# Patient Record
Sex: Male | Born: 1935 | Race: White | Hispanic: No | Smoking: Never smoker
Health system: Southern US, Community
[De-identification: ages and names within clinical notes are randomized; demographics above are authoritative.]

## PROBLEM LIST (undated history)

## (undated) DIAGNOSIS — Z87442 Personal history of urinary calculi: Secondary | ICD-10-CM

## (undated) DIAGNOSIS — N200 Calculus of kidney: Secondary | ICD-10-CM

## (undated) DIAGNOSIS — Q7649 Other congenital malformations of spine, not associated with scoliosis: Secondary | ICD-10-CM

## (undated) DIAGNOSIS — D490 Neoplasm of unspecified behavior of digestive system: Secondary | ICD-10-CM

## (undated) DIAGNOSIS — E785 Hyperlipidemia, unspecified: Secondary | ICD-10-CM

## (undated) DIAGNOSIS — C61 Malignant neoplasm of prostate: Secondary | ICD-10-CM

## (undated) DIAGNOSIS — I340 Nonrheumatic mitral (valve) insufficiency: Secondary | ICD-10-CM

## (undated) DIAGNOSIS — N400 Enlarged prostate without lower urinary tract symptoms: Secondary | ICD-10-CM

## (undated) DIAGNOSIS — R3911 Hesitancy of micturition: Secondary | ICD-10-CM

## (undated) DIAGNOSIS — R600 Localized edema: Secondary | ICD-10-CM

## (undated) DIAGNOSIS — Z972 Presence of dental prosthetic device (complete) (partial): Secondary | ICD-10-CM

## (undated) DIAGNOSIS — K5909 Other constipation: Secondary | ICD-10-CM

## (undated) DIAGNOSIS — E538 Deficiency of other specified B group vitamins: Secondary | ICD-10-CM

## (undated) DIAGNOSIS — Z955 Presence of coronary angioplasty implant and graft: Secondary | ICD-10-CM

## (undated) DIAGNOSIS — I10 Essential (primary) hypertension: Secondary | ICD-10-CM

## (undated) DIAGNOSIS — G4730 Sleep apnea, unspecified: Secondary | ICD-10-CM

## (undated) DIAGNOSIS — M109 Gout, unspecified: Secondary | ICD-10-CM

## (undated) DIAGNOSIS — M810 Age-related osteoporosis without current pathological fracture: Secondary | ICD-10-CM

## (undated) DIAGNOSIS — R339 Retention of urine, unspecified: Secondary | ICD-10-CM

## (undated) DIAGNOSIS — Z974 Presence of external hearing-aid: Secondary | ICD-10-CM

## (undated) DIAGNOSIS — I071 Rheumatic tricuspid insufficiency: Secondary | ICD-10-CM

## (undated) DIAGNOSIS — I2510 Atherosclerotic heart disease of native coronary artery without angina pectoris: Secondary | ICD-10-CM

## (undated) DIAGNOSIS — M1990 Unspecified osteoarthritis, unspecified site: Secondary | ICD-10-CM

## (undated) DIAGNOSIS — M47819 Spondylosis without myelopathy or radiculopathy, site unspecified: Secondary | ICD-10-CM

## (undated) DIAGNOSIS — R0602 Shortness of breath: Secondary | ICD-10-CM

## (undated) DIAGNOSIS — M479 Spondylosis, unspecified: Secondary | ICD-10-CM

## (undated) HISTORY — DX: Other constipation: K59.09

## (undated) HISTORY — PX: LITHOTRIPSY: SUR834

## (undated) HISTORY — DX: Age-related osteoporosis without current pathological fracture: M81.0

## (undated) HISTORY — PX: TONSILLECTOMY: SUR1361

## (undated) HISTORY — DX: Hyperlipidemia, unspecified: E78.5

## (undated) HISTORY — DX: Nonrheumatic mitral (valve) insufficiency: I34.0

## (undated) HISTORY — PX: JOINT REPLACEMENT: SHX530

## (undated) HISTORY — DX: Deficiency of other specified B group vitamins: E53.8

## (undated) HISTORY — DX: Rheumatic tricuspid insufficiency: I07.1

## (undated) HISTORY — PX: APPENDECTOMY: SHX54

## (undated) HISTORY — DX: Spondylosis, unspecified: M47.9

## (undated) HISTORY — DX: Gout, unspecified: M10.9

## (undated) HISTORY — PX: FOREIGN BODY REMOVAL: SHX962

## (undated) HISTORY — DX: Retention of urine, unspecified: R33.9

## (undated) HISTORY — PX: TOTAL HIP ARTHROPLASTY: SHX124

## (undated) HISTORY — DX: Sleep apnea, unspecified: G47.30

## (undated) HISTORY — PX: BACK SURGERY: SHX140

## (undated) HISTORY — PX: HERNIA REPAIR: SHX51

## (undated) HISTORY — PX: KIDNEY STONE SURGERY: SHX686

## (undated) HISTORY — DX: Spondylosis without myelopathy or radiculopathy, site unspecified: M47.819

---

## 1998-05-15 HISTORY — PX: CARDIAC CATHETERIZATION: SHX172

## 2000-05-15 DIAGNOSIS — Z955 Presence of coronary angioplasty implant and graft: Secondary | ICD-10-CM

## 2000-05-15 HISTORY — DX: Presence of coronary angioplasty implant and graft: Z95.5

## 2004-04-26 ENCOUNTER — Ambulatory Visit: Payer: Self-pay | Admitting: Unknown Physician Specialty

## 2005-03-15 ENCOUNTER — Ambulatory Visit: Payer: Self-pay | Admitting: Urology

## 2006-12-07 ENCOUNTER — Ambulatory Visit: Payer: Self-pay | Admitting: Specialist

## 2007-01-25 ENCOUNTER — Ambulatory Visit: Payer: Self-pay | Admitting: Gastroenterology

## 2008-05-22 ENCOUNTER — Ambulatory Visit: Payer: Self-pay | Admitting: Vascular Surgery

## 2008-11-23 ENCOUNTER — Ambulatory Visit: Payer: Self-pay | Admitting: Physician Assistant

## 2008-11-30 ENCOUNTER — Ambulatory Visit: Payer: Self-pay | Admitting: Pain Medicine

## 2008-12-15 ENCOUNTER — Ambulatory Visit: Payer: Self-pay | Admitting: Pain Medicine

## 2008-12-30 ENCOUNTER — Ambulatory Visit: Payer: Self-pay | Admitting: Physician Assistant

## 2009-01-12 ENCOUNTER — Ambulatory Visit: Payer: Self-pay | Admitting: Pain Medicine

## 2009-01-26 ENCOUNTER — Ambulatory Visit: Payer: Self-pay | Admitting: Physician Assistant

## 2009-02-09 ENCOUNTER — Ambulatory Visit: Payer: Self-pay | Admitting: Pain Medicine

## 2009-02-24 ENCOUNTER — Ambulatory Visit: Payer: Self-pay | Admitting: Physician Assistant

## 2009-08-10 ENCOUNTER — Ambulatory Visit: Payer: Self-pay | Admitting: Urology

## 2009-08-24 ENCOUNTER — Ambulatory Visit: Payer: Self-pay | Admitting: Urology

## 2011-03-27 ENCOUNTER — Inpatient Hospital Stay: Payer: Self-pay | Admitting: Internal Medicine

## 2011-10-02 ENCOUNTER — Encounter: Payer: Self-pay | Admitting: Internal Medicine

## 2011-12-05 ENCOUNTER — Other Ambulatory Visit: Payer: Self-pay | Admitting: Neurosurgery

## 2011-12-05 DIAGNOSIS — M549 Dorsalgia, unspecified: Secondary | ICD-10-CM

## 2011-12-11 ENCOUNTER — Ambulatory Visit
Admission: RE | Admit: 2011-12-11 | Discharge: 2011-12-11 | Disposition: A | Discharge status: Home / Self Care | Payer: Medicare Other | Source: Ambulatory Visit | Attending: Neurosurgery | Admitting: Neurosurgery

## 2011-12-11 DIAGNOSIS — M549 Dorsalgia, unspecified: Secondary | ICD-10-CM

## 2012-01-09 ENCOUNTER — Other Ambulatory Visit: Payer: Self-pay | Admitting: Neurosurgery

## 2012-01-12 ENCOUNTER — Encounter (HOSPITAL_COMMUNITY): Payer: Self-pay | Admitting: Pharmacy Technician

## 2012-01-16 ENCOUNTER — Encounter (HOSPITAL_COMMUNITY)
Admission: RE | Admit: 2012-01-16 | Discharge: 2012-01-16 | Disposition: A | Discharge status: Home / Self Care | Payer: Medicare Other | Source: Ambulatory Visit | Attending: Neurosurgery | Admitting: Neurosurgery

## 2012-01-16 ENCOUNTER — Ambulatory Visit (HOSPITAL_COMMUNITY)
Admission: RE | Admit: 2012-01-16 | Discharge: 2012-01-16 | Disposition: A | Discharge status: Home / Self Care | Payer: Medicare Other | Source: Ambulatory Visit | Attending: Neurosurgery | Admitting: Neurosurgery

## 2012-01-16 ENCOUNTER — Encounter (HOSPITAL_COMMUNITY): Payer: Self-pay

## 2012-01-16 DIAGNOSIS — Z01812 Encounter for preprocedural laboratory examination: Secondary | ICD-10-CM | POA: Insufficient documentation

## 2012-01-16 DIAGNOSIS — J984 Other disorders of lung: Secondary | ICD-10-CM | POA: Insufficient documentation

## 2012-01-16 DIAGNOSIS — Z01818 Encounter for other preprocedural examination: Secondary | ICD-10-CM | POA: Insufficient documentation

## 2012-01-16 HISTORY — DX: Benign prostatic hyperplasia without lower urinary tract symptoms: N40.0

## 2012-01-16 HISTORY — DX: Atherosclerotic heart disease of native coronary artery without angina pectoris: I25.10

## 2012-01-16 HISTORY — DX: Essential (primary) hypertension: I10

## 2012-01-16 HISTORY — DX: Unspecified osteoarthritis, unspecified site: M19.90

## 2012-01-16 HISTORY — DX: Shortness of breath: R06.02

## 2012-01-16 HISTORY — DX: Hesitancy of micturition: R39.11

## 2012-01-16 HISTORY — DX: Personal history of urinary calculi: Z87.442

## 2012-01-16 LAB — BASIC METABOLIC PANEL
CO2: 31 mEq/L (ref 19–32)
Chloride: 102 mEq/L (ref 96–112)
Sodium: 142 mEq/L (ref 135–145)

## 2012-01-16 LAB — CBC
HCT: 43.6 % (ref 39.0–52.0)
MCV: 93.8 fL (ref 78.0–100.0)
RBC: 4.65 MIL/uL (ref 4.22–5.81)
WBC: 7.3 10*3/uL (ref 4.0–10.5)

## 2012-01-16 LAB — SURGICAL PCR SCREEN: Staphylococcus aureus: POSITIVE — AB

## 2012-01-16 NOTE — Consult Note (Addendum)
Anesthesia Chart Review:  Patient is a 78 year old male scheduled for L2-3 laminectomies by Dr. Lovell Sheehan on 01/22/12.  History includes CAD s/p RCA stent '04, HTN, HLD, osteoporosis, prior hip replacement, kidney stones, former smoker, obesity, BPH.  His Cardiologist is Dr. Gwen Pounds at [facility].  He was cleared for this procedure.  Nuclear stress test on 03/21/11 showed: 1) Normal Regadenoson ECG without no evidence of ischemia or dysrhythmia, 2) Normal left ventricular function with EF 47%, 3) Normal myocardial perfusion images at rest and peak stress without evidence of ischemia.  Echo on 08/09/10 showed normal LV systolic function, EF 60%, mild biatrial enlargement, moderate LVH, moderate MI/TI and mild AI, mild pulmonary HTN.  EKG on 03/08/11 showed SR, first degree AVB.  CXR on 01/16/12 showed: Ill-defined left lung lesion (lingular density, cannot exclude mass). Recommend chest CT for further evaluation.  This result was called to Graciella Belton.  She will review with Dr. Lovell Sheehan to determine the follow-up plan.  Labs noted.   Anticipate he can proceed if no new cardiopulmonary symptoms.  Shonna Chock, PA-C

## 2012-01-16 NOTE — Pre-Procedure Instructions (Addendum)
22 Theodore Padilla  01/16/2012   Your procedure is scheduled on:  Monday September 9  Report to [facility] Stay [facility] at 5:30 AM.  Call this number if you have problems the morning of surgery: [phone]   Remember:   Do not eat or drink:After Midnight.    Take these medicines the morning of surgery with A SIP OF WATER: allopurinol (Zyloprim)   Do not wear jewelry, make-up or nail polish.  Do not wear lotions, powders, or perfumes. You may wear deodorant.  Do not shave 48 hours prior to surgery. Men may shave face and neck.  Do not bring valuables to the hospital.  Contacts, dentures or bridgework may not be worn into surgery.  Leave suitcase in the car. After surgery it may be brought to your room.  For patients admitted to the hospital, checkout time is 11:00 AM the day of discharge.   Patients discharged the day of surgery will not be allowed to drive home.  Name and phone number of your driver: NA  Special Instructions: CHG Shower Use Special Wash: 1/2 bottle night before surgery and 1/2 bottle morning of surgery.   Please read over the following fact sheets that you were given: Pain Booklet, Coughing and Deep Breathing and Surgical Site Infection Prevention

## 2012-01-19 ENCOUNTER — Other Ambulatory Visit: Payer: Self-pay | Admitting: Neurosurgery

## 2012-01-19 ENCOUNTER — Ambulatory Visit
Admission: RE | Admit: 2012-01-19 | Discharge: 2012-01-19 | Disposition: A | Discharge status: Home / Self Care | Payer: Medicare Other | Source: Ambulatory Visit | Attending: Neurosurgery | Admitting: Neurosurgery

## 2012-01-19 DIAGNOSIS — R9389 Abnormal findings on diagnostic imaging of other specified body structures: Secondary | ICD-10-CM

## 2012-01-19 MED ORDER — IOHEXOL 300 MG/ML  SOLN
75.0000 mL | Freq: Once | INTRAMUSCULAR | Status: AC | PRN
  Administered 2012-01-19: 75 mL via INTRAVENOUS

## 2012-01-22 ENCOUNTER — Inpatient Hospital Stay (HOSPITAL_COMMUNITY): Payer: Medicare Other | Admitting: Vascular Surgery

## 2012-01-22 ENCOUNTER — Encounter (HOSPITAL_COMMUNITY)
Admission: RE | Disposition: A | Discharge status: Skilled Nursing Facility | Payer: Self-pay | Source: Ambulatory Visit | Attending: Neurosurgery

## 2012-01-22 ENCOUNTER — Inpatient Hospital Stay (HOSPITAL_COMMUNITY)
Admission: RE | Admit: 2012-01-22 | Discharge: 2012-01-26 | DRG: 491 | Disposition: A | Discharge status: Skilled Nursing Facility | Payer: Medicare Other | Source: Ambulatory Visit | Attending: Neurosurgery | Admitting: Neurosurgery

## 2012-01-22 ENCOUNTER — Encounter (HOSPITAL_COMMUNITY): Payer: Self-pay | Admitting: *Deleted

## 2012-01-22 ENCOUNTER — Encounter (HOSPITAL_COMMUNITY): Payer: Self-pay | Admitting: Vascular Surgery

## 2012-01-22 ENCOUNTER — Inpatient Hospital Stay (HOSPITAL_COMMUNITY): Payer: Medicare Other

## 2012-01-22 DIAGNOSIS — I2510 Atherosclerotic heart disease of native coronary artery without angina pectoris: Secondary | ICD-10-CM | POA: Diagnosis present

## 2012-01-22 DIAGNOSIS — Z7982 Long term (current) use of aspirin: Secondary | ICD-10-CM

## 2012-01-22 DIAGNOSIS — Z87891 Personal history of nicotine dependence: Secondary | ICD-10-CM

## 2012-01-22 DIAGNOSIS — I10 Essential (primary) hypertension: Secondary | ICD-10-CM | POA: Diagnosis present

## 2012-01-22 DIAGNOSIS — M48062 Spinal stenosis, lumbar region with neurogenic claudication: Principal | ICD-10-CM | POA: Diagnosis present

## 2012-01-22 DIAGNOSIS — N400 Enlarged prostate without lower urinary tract symptoms: Secondary | ICD-10-CM | POA: Diagnosis present

## 2012-01-22 DIAGNOSIS — M4120 Other idiopathic scoliosis, site unspecified: Secondary | ICD-10-CM | POA: Diagnosis present

## 2012-01-22 HISTORY — PX: LUMBAR LAMINECTOMY/DECOMPRESSION MICRODISCECTOMY: SHX5026

## 2012-01-22 SURGERY — LUMBAR LAMINECTOMY/DECOMPRESSION MICRODISCECTOMY 1 LEVEL
Anesthesia: General | Site: Back | Wound class: Clean

## 2012-01-22 MED ORDER — LISINOPRIL 10 MG PO TABS
10.0000 mg | ORAL_TABLET | Freq: Every day | ORAL | Status: DC
  Administered 2012-01-23 – 2012-01-26 (×4): 10 mg via ORAL
  Filled 2012-01-22 (×4): qty 1

## 2012-01-22 MED ORDER — SODIUM CHLORIDE 0.9 % IV BOLUS (SEPSIS)
250.0000 mL | Freq: Once | INTRAVENOUS | Status: AC
  Administered 2012-01-22: 250 mL via INTRAVENOUS

## 2012-01-22 MED ORDER — PHENOL 1.4 % MT LIQD: 1.0000 | OROMUCOSAL | Status: DC | PRN

## 2012-01-22 MED ORDER — ALLOPURINOL 100 MG PO TABS
100.0000 mg | ORAL_TABLET | Freq: Every day | ORAL | Status: DC
  Administered 2012-01-23 – 2012-01-26 (×4): 100 mg via ORAL
  Filled 2012-01-22 (×6): qty 1

## 2012-01-22 MED ORDER — EPHEDRINE SULFATE 50 MG/ML IJ SOLN
INTRAMUSCULAR | Status: DC | PRN
  Administered 2012-01-22 (×2): 5 mg via INTRAVENOUS

## 2012-01-22 MED ORDER — BACITRACIN ZINC 500 UNIT/GM EX OINT
TOPICAL_OINTMENT | CUTANEOUS | Status: DC | PRN
  Administered 2012-01-22: 1 via TOPICAL

## 2012-01-22 MED ORDER — LIDOCAINE HCL (CARDIAC) 20 MG/ML IV SOLN
INTRAVENOUS | Status: DC | PRN
  Administered 2012-01-22: 50 mg via INTRAVENOUS

## 2012-01-22 MED ORDER — PHENYLEPHRINE HCL 10 MG/ML IJ SOLN
10.0000 mg | INTRAVENOUS | Status: DC | PRN
  Administered 2012-01-22: 40 ug/min via INTRAVENOUS

## 2012-01-22 MED ORDER — HYDROMORPHONE HCL PF 1 MG/ML IJ SOLN: 0.2500 mg | INTRAMUSCULAR | Status: DC | PRN

## 2012-01-22 MED ORDER — DOCUSATE SODIUM 100 MG PO CAPS
100.0000 mg | ORAL_CAPSULE | Freq: Two times a day (BID) | ORAL | Status: DC
  Administered 2012-01-22 – 2012-01-26 (×9): 100 mg via ORAL
  Filled 2012-01-22 (×9): qty 1

## 2012-01-22 MED ORDER — ZOLPIDEM TARTRATE 5 MG PO TABS
5.0000 mg | ORAL_TABLET | Freq: Every evening | ORAL | Status: DC | PRN
  Administered 2012-01-22 – 2012-01-25 (×4): 5 mg via ORAL
  Filled 2012-01-22 (×4): qty 1

## 2012-01-22 MED ORDER — OXYCODONE HCL 5 MG/5ML PO SOLN: 5.0000 mg | Freq: Once | ORAL | Status: DC | PRN

## 2012-01-22 MED ORDER — ACETAMINOPHEN 650 MG RE SUPP: 650.0000 mg | RECTAL | Status: DC | PRN

## 2012-01-22 MED ORDER — LISINOPRIL-HYDROCHLOROTHIAZIDE 10-12.5 MG PO TABS: 1.0000 | ORAL_TABLET | Freq: Every day | ORAL | Status: DC

## 2012-01-22 MED ORDER — ROCURONIUM BROMIDE 100 MG/10ML IV SOLN
INTRAVENOUS | Status: DC | PRN
  Administered 2012-01-22: 50 mg via INTRAVENOUS
  Administered 2012-01-22: 10 mg via INTRAVENOUS

## 2012-01-22 MED ORDER — ONDANSETRON HCL 4 MG/2ML IJ SOLN
INTRAMUSCULAR | Status: DC | PRN
  Administered 2012-01-22: 4 mg via INTRAVENOUS

## 2012-01-22 MED ORDER — DIAZEPAM 2 MG PO TABS
2.0000 mg | ORAL_TABLET | Freq: Four times a day (QID) | ORAL | Status: DC | PRN
  Filled 2012-01-22: qty 1

## 2012-01-22 MED ORDER — MORPHINE SULFATE 2 MG/ML IJ SOLN: 1.0000 mg | INTRAMUSCULAR | Status: DC | PRN

## 2012-01-22 MED ORDER — ONDANSETRON HCL 4 MG/2ML IJ SOLN: 4.0000 mg | INTRAMUSCULAR | Status: DC | PRN

## 2012-01-22 MED ORDER — PROPOFOL 10 MG/ML IV EMUL
INTRAVENOUS | Status: DC | PRN
  Administered 2012-01-22: 110 mg via INTRAVENOUS

## 2012-01-22 MED ORDER — HYDROCODONE-ACETAMINOPHEN 5-325 MG PO TABS
1.0000 | ORAL_TABLET | ORAL | Status: DC | PRN
  Administered 2012-01-22 (×2): 2 via ORAL
  Administered 2012-01-26: 1 via ORAL
  Filled 2012-01-22: qty 2
  Filled 2012-01-22: qty 1
  Filled 2012-01-22: qty 2

## 2012-01-22 MED ORDER — LACTATED RINGERS IV SOLN
INTRAVENOUS | Status: DC
  Administered 2012-01-22 – 2012-01-24 (×3): via INTRAVENOUS

## 2012-01-22 MED ORDER — SODIUM CHLORIDE 0.9 % IV SOLN
INTRAVENOUS | Status: AC
  Filled 2012-01-22: qty 500

## 2012-01-22 MED ORDER — HEMOSTATIC AGENTS (NO CHARGE) OPTIME
TOPICAL | Status: DC | PRN
  Administered 2012-01-22: 1 via TOPICAL

## 2012-01-22 MED ORDER — VANCOMYCIN HCL IN DEXTROSE 1-5 GM/200ML-% IV SOLN
INTRAVENOUS | Status: AC
  Administered 2012-01-22: 1000 mg via INTRAVENOUS
  Filled 2012-01-22: qty 200

## 2012-01-22 MED ORDER — OXYCODONE-ACETAMINOPHEN 5-325 MG PO TABS
1.0000 | ORAL_TABLET | ORAL | Status: DC | PRN
  Administered 2012-01-23 (×2): 2 via ORAL
  Administered 2012-01-24: 1 via ORAL
  Administered 2012-01-25: 2 via ORAL
  Administered 2012-01-25: 1 via ORAL
  Administered 2012-01-25 – 2012-01-26 (×2): 2 via ORAL
  Filled 2012-01-22 (×4): qty 2
  Filled 2012-01-22: qty 1
  Filled 2012-01-22 (×2): qty 2

## 2012-01-22 MED ORDER — SUFENTANIL CITRATE 50 MCG/ML IV SOLN
INTRAVENOUS | Status: DC | PRN
  Administered 2012-01-22: 20 ug via INTRAVENOUS

## 2012-01-22 MED ORDER — ACETAMINOPHEN 325 MG PO TABS: 650.0000 mg | ORAL_TABLET | ORAL | Status: DC | PRN

## 2012-01-22 MED ORDER — LACTATED RINGERS IV SOLN
INTRAVENOUS | Status: DC | PRN
  Administered 2012-01-22 (×2): via INTRAVENOUS

## 2012-01-22 MED ORDER — BACITRACIN 50000 UNITS IM SOLR
INTRAMUSCULAR | Status: AC
  Filled 2012-01-22: qty 1

## 2012-01-22 MED ORDER — VANCOMYCIN HCL IN DEXTROSE 1-5 GM/200ML-% IV SOLN
1000.0000 mg | Freq: Once | INTRAVENOUS | Status: AC
  Administered 2012-01-22: 1000 mg via INTRAVENOUS
  Filled 2012-01-22: qty 200

## 2012-01-22 MED ORDER — GLYCOPYRROLATE 0.2 MG/ML IJ SOLN
INTRAMUSCULAR | Status: DC | PRN
  Administered 2012-01-22: 0.6 mg via INTRAVENOUS

## 2012-01-22 MED ORDER — MENTHOL 3 MG MT LOZG: 1.0000 | LOZENGE | OROMUCOSAL | Status: DC | PRN

## 2012-01-22 MED ORDER — THROMBIN 5000 UNITS EX SOLR
CUTANEOUS | Status: DC | PRN
  Administered 2012-01-22 (×2): 5000 [IU] via TOPICAL

## 2012-01-22 MED ORDER — NEOSTIGMINE METHYLSULFATE 1 MG/ML IJ SOLN
INTRAMUSCULAR | Status: DC | PRN
  Administered 2012-01-22: 5 mg via INTRAVENOUS

## 2012-01-22 MED ORDER — HYDROCHLOROTHIAZIDE 12.5 MG PO CAPS
12.5000 mg | ORAL_CAPSULE | Freq: Every day | ORAL | Status: DC
  Administered 2012-01-23 – 2012-01-26 (×4): 12.5 mg via ORAL
  Filled 2012-01-22 (×4): qty 1

## 2012-01-22 MED ORDER — 0.9 % SODIUM CHLORIDE (POUR BTL) OPTIME
TOPICAL | Status: DC | PRN
  Administered 2012-01-22: 1000 mL

## 2012-01-22 MED ORDER — BUPIVACAINE-EPINEPHRINE PF 0.5-1:200000 % IJ SOLN
INTRAMUSCULAR | Status: DC | PRN
  Administered 2012-01-22: 10 mL

## 2012-01-22 MED ORDER — SODIUM CHLORIDE 0.9 % IR SOLN
Status: DC | PRN
  Administered 2012-01-22: 09:00:00

## 2012-01-22 MED ORDER — OXYCODONE HCL 5 MG PO TABS: 5.0000 mg | ORAL_TABLET | Freq: Once | ORAL | Status: DC | PRN

## 2012-01-22 SURGICAL SUPPLY — 56 items
BAG DECANTER FOR FLEXI CONT (MISCELLANEOUS) ×2 IMPLANT
BENZOIN TINCTURE PRP APPL 2/3 (GAUZE/BANDAGES/DRESSINGS) ×2 IMPLANT
BLADE SURG ROTATE 9660 (MISCELLANEOUS) IMPLANT
BRUSH SCRUB EZ PLAIN DRY (MISCELLANEOUS) ×2 IMPLANT
BUR ACORN 6.0 (BURR) ×2 IMPLANT
BUR MATCHSTICK NEURO 3.0 LAGG (BURR) ×2 IMPLANT
CANISTER SUCTION 2500CC (MISCELLANEOUS) ×2 IMPLANT
CLOTH BEACON ORANGE TIMEOUT ST (SAFETY) ×2 IMPLANT
CONT SPEC 4OZ CLIKSEAL STRL BL (MISCELLANEOUS) ×2 IMPLANT
DRAPE LAPAROTOMY 100X72X124 (DRAPES) ×2 IMPLANT
DRAPE MICROSCOPE LEICA (MISCELLANEOUS) ×2 IMPLANT
DRAPE POUCH INSTRU U-SHP 10X18 (DRAPES) ×2 IMPLANT
DRAPE SURG 17X23 STRL (DRAPES) ×8 IMPLANT
ELECT BLADE 4.0 EZ CLEAN MEGAD (MISCELLANEOUS) ×2
ELECT REM PT RETURN 9FT ADLT (ELECTROSURGICAL) ×2
ELECTRODE BLDE 4.0 EZ CLN MEGD (MISCELLANEOUS) ×1 IMPLANT
ELECTRODE REM PT RTRN 9FT ADLT (ELECTROSURGICAL) ×1 IMPLANT
GAUZE SPONGE 4X4 16PLY XRAY LF (GAUZE/BANDAGES/DRESSINGS) IMPLANT
GLOVE BIO SURGEON STRL SZ8.5 (GLOVE) ×2 IMPLANT
GLOVE BIOGEL PI IND STRL 7.0 (GLOVE) ×2 IMPLANT
GLOVE BIOGEL PI IND STRL 8.5 (GLOVE) ×1 IMPLANT
GLOVE BIOGEL PI INDICATOR 7.0 (GLOVE) ×2
GLOVE BIOGEL PI INDICATOR 8.5 (GLOVE) ×1
GLOVE ECLIPSE 8.5 STRL (GLOVE) ×2 IMPLANT
GLOVE EXAM NITRILE LRG STRL (GLOVE) IMPLANT
GLOVE EXAM NITRILE MD LF STRL (GLOVE) ×2 IMPLANT
GLOVE EXAM NITRILE XL STR (GLOVE) IMPLANT
GLOVE EXAM NITRILE XS STR PU (GLOVE) IMPLANT
GLOVE SS BIOGEL STRL SZ 6.5 (GLOVE) ×3 IMPLANT
GLOVE SS BIOGEL STRL SZ 8 (GLOVE) ×1 IMPLANT
GLOVE SUPERSENSE BIOGEL SZ 6.5 (GLOVE) ×3
GLOVE SUPERSENSE BIOGEL SZ 8 (GLOVE) ×1
GOWN BRE IMP SLV AUR LG STRL (GOWN DISPOSABLE) ×2 IMPLANT
GOWN BRE IMP SLV AUR XL STRL (GOWN DISPOSABLE) ×2 IMPLANT
GOWN STRL REIN 2XL LVL4 (GOWN DISPOSABLE) ×2 IMPLANT
KIT BASIN OR (CUSTOM PROCEDURE TRAY) ×2 IMPLANT
KIT ROOM TURNOVER OR (KITS) ×2 IMPLANT
NEEDLE HYPO 21X1.5 SAFETY (NEEDLE) IMPLANT
NEEDLE HYPO 22GX1.5 SAFETY (NEEDLE) ×2 IMPLANT
NS IRRIG 1000ML POUR BTL (IV SOLUTION) ×2 IMPLANT
PACK LAMINECTOMY NEURO (CUSTOM PROCEDURE TRAY) ×2 IMPLANT
PAD ARMBOARD 7.5X6 YLW CONV (MISCELLANEOUS) ×8 IMPLANT
PATTIES SURGICAL .5 X1 (DISPOSABLE) IMPLANT
RUBBERBAND STERILE (MISCELLANEOUS) ×4 IMPLANT
SPONGE GAUZE 4X4 12PLY (GAUZE/BANDAGES/DRESSINGS) ×2 IMPLANT
SPONGE SURGIFOAM ABS GEL SZ50 (HEMOSTASIS) ×2 IMPLANT
STRIP CLOSURE SKIN 1/2X4 (GAUZE/BANDAGES/DRESSINGS) ×2 IMPLANT
SUT VIC AB 1 CT1 18XBRD ANBCTR (SUTURE) ×1 IMPLANT
SUT VIC AB 1 CT1 8-18 (SUTURE) ×1
SUT VIC AB 2-0 CP2 18 (SUTURE) ×4 IMPLANT
SYR 20CC LL (SYRINGE) IMPLANT
SYR 20ML ECCENTRIC (SYRINGE) ×2 IMPLANT
TAPE CLOTH SURG 4X10 WHT LF (GAUZE/BANDAGES/DRESSINGS) ×2 IMPLANT
TOWEL OR 17X24 6PK STRL BLUE (TOWEL DISPOSABLE) ×2 IMPLANT
TOWEL OR 17X26 10 PK STRL BLUE (TOWEL DISPOSABLE) ×2 IMPLANT
WATER STERILE IRR 1000ML POUR (IV SOLUTION) ×2 IMPLANT

## 2012-01-22 NOTE — Transfer of Care (Signed)
Immediate Anesthesia Transfer of Care Note  Patient: Theodore Padilla  Procedure(s) Performed: Procedure(s) (LRB) with comments: LUMBAR LAMINECTOMY/DECOMPRESSION MICRODISCECTOMY 1 LEVEL (N/A) - Lumbar two and lumbar three laminectomies  Patient Location: PACU  Anesthesia Type: General  Level of Consciousness: awake, alert  and patient cooperative  Airway & Oxygen Therapy: Patient Spontanous Breathing and Patient connected to face mask oxygen  Post-op Assessment: Report given to PACU RN, Post -op Vital signs reviewed and stable, Patient moving all extremities and Patient moving all extremities X 4  Post vital signs: Reviewed and stable  Complications: No apparent anesthesia complications

## 2012-01-22 NOTE — Anesthesia Postprocedure Evaluation (Signed)
  Anesthesia Post-op Note  Patient: Theodore Padilla  Procedure(s) Performed: Procedure(s) (LRB) with comments: LUMBAR LAMINECTOMY/DECOMPRESSION MICRODISCECTOMY 1 LEVEL (N/A) - Lumbar two and lumbar three laminectomies  Patient Location: PACU  Anesthesia Type: General  Level of Consciousness: awake  Airway and Oxygen Therapy: Patient Spontanous Breathing and Patient connected to nasal cannula oxygen  Post-op Pain: none  Post-op Assessment: Post-op Vital signs reviewed, Patient's Cardiovascular Status Stable, Respiratory Function Stable, Patent Airway and No signs of Nausea or vomiting  Post-op Vital Signs: Reviewed and stable  Complications: No apparent anesthesia complications

## 2012-01-22 NOTE — Anesthesia Preprocedure Evaluation (Signed)
Anesthesia Evaluation  Patient identified by MRN, date of birth, ID band Patient awake    Reviewed: Allergy & Precautions, H&P , NPO status , Patient's Chart, lab work & pertinent test results  Airway Mallampati: II TM Distance: >3 FB Neck ROM: Full    Dental No notable dental hx. (+) Poor Dentition and Dental Advisory Given   Pulmonary former smoker,  breath sounds clear to auscultation  Pulmonary exam normal       Cardiovascular hypertension, On Medications + CAD and + Cardiac Stents Rhythm:Regular Rate:Normal     Neuro/Psych negative neurological ROS  negative psych ROS   GI/Hepatic negative GI ROS, Neg liver ROS,   Endo/Other  negative endocrine ROS  Renal/GU negative Renal ROS  negative genitourinary   Musculoskeletal   Abdominal   Peds  Hematology negative hematology ROS (+)   Anesthesia Other Findings   Reproductive/Obstetrics negative OB ROS                           Anesthesia Physical Anesthesia Plan  ASA: III  Anesthesia Plan: General   Post-op Pain Management:    Induction: Intravenous  Airway Management Planned: Oral ETT  Additional Equipment:   Intra-op Plan:   Post-operative Plan: Extubation in OR  Informed Consent: I have reviewed the patients History and Physical, chart, labs and discussed the procedure including the risks, benefits and alternatives for the proposed anesthesia with the patient or authorized representative who has indicated his/her understanding and acceptance.   Dental advisory given  Plan Discussed with: CRNA  Anesthesia Plan Comments:         Anesthesia Quick Evaluation

## 2012-01-22 NOTE — H&P (Signed)
Subjective: Patient is 78 year old white male who has suffered from back buttocks and leg pain consistent with neurogenic claudication. He has failed medical management and was worked up with a lumbar MRI. This demonstrated multilevel stenosis. I discussed the various treatment options with the patient including surgery. The patient has weighed the risks, benefits, and alternatives surgery decided proceed with a decompressive laminectomy.   Past Medical History  Diagnosis Date  . Hypertension   . Coronary artery disease     with stenting x 1  . Shortness of breath     occasional  . History of kidney stones   . Arthritis   . BPH (benign prostatic hyperplasia)   . Urinary hesitancy     Past Surgical History  Procedure Date  . Total hip arthroplasty     bilat  . Appendectomy   . Lithotripsy   . Kidney stone surgery   . Hernia repair   . Foreign body removal     L knee  . Tonsillectomy   . Cardiac catheterization 2000    with stent    Allergies  Allergen Reactions  . Penicillins Anaphylaxis  . Tramadol Nausea And Vomiting    History  Substance Use Topics  . Smoking status: Former Games developer  . Smokeless tobacco: Not on file  . Alcohol Use: Yes     occasional wine    History reviewed. No pertinent family history. Prior to Admission medications   Medication Sig Start Date End Date Taking? Authorizing Provider  allopurinol (ZYLOPRIM) 100 MG tablet Take 100 mg by mouth daily.   Yes Historical Provider, MD  aspirin 81 MG tablet Take 81 mg by mouth daily.   Yes Historical Provider, MD  docusate sodium (COLACE) 100 MG capsule Take 100 mg by mouth daily.   Yes Historical Provider, MD  HYDROcodone-acetaminophen (NORCO) 7.5-325 MG per tablet Take 1 tablet by mouth every 6 (six) hours as needed. 1-2 tablets   Yes Historical Provider, MD  lisinopril-hydrochlorothiazide (PRINZIDE,ZESTORETIC) 10-12.5 MG per tablet Take 1 tablet by mouth daily.   Yes Historical Provider, MD     Review  of Systems  Positive ROS: As above  All other systems have been reviewed and were otherwise negative with the exception of those mentioned in the HPI and as above.  Objective: Vital signs in last 24 hours: Temp:  [98.3 F (36.8 C)] 98.3 F (36.8 C) (09/09 0622) Pulse Rate:  [84] 84  (09/09 0622) Resp:  [18] 18  (09/09 0622) BP: (148)/(72) 148/72 mmHg (09/09 0622) SpO2:  [97 %] 97 % (09/09 0622)  General Appearance: Alert, cooperative, no distress, appears stated age Head: Normocephalic, without obvious abnormality, atraumatic Eyes: PERRL, conjunctiva/corneas clear, EOM's intact, fundi benign, both eyes      Ears: Normal TM's and external ear canals, both ears Throat: Lips, mucosa, and tongue normal; teeth and gums normal Neck: Supple, symmetrical, trachea midline, no adenopathy; thyroid: No enlargement/tenderness/nodules; no carotid bruit or JVD Back: Symmetric, no curvature, ROM normal, no CVA tenderness Lungs: Clear to auscultation bilaterally, respirations unlabored Heart: Regular rate and rhythm, S1 and S2 normal, no murmur, rub or gallop Abdomen: Soft, non-tender, bowel sounds active all four quadrants, no masses, no organomegaly Extremities: Extremities normal, atraumatic, no cyanosis or edema Pulses: 2+ and symmetric all extremities Skin: Skin color, texture, turgor normal, no rashes or lesions  NEUROLOGIC:   Mental status: alert and oriented, no aphasia, good attention span, Fund of knowledge/ memory ok Motor Exam - grossly normal Sensory Exam -  grossly normal Reflexes:  Coordination - grossly normal Gait - grossly normal Balance - grossly normal Cranial Nerves: I: smell Not tested  II: visual acuity  OS: Normal    OD: Normal   II: visual fields Full to confrontation  II: pupils Equal, round, reactive to light  III,VII: ptosis None  III,IV,VI: extraocular muscles  Full ROM  V: mastication Normal  V: facial light touch sensation  Normal  V,VII: corneal reflex   Present  VII: facial muscle function - upper  Normal  VII: facial muscle function - lower Normal  VIII: hearing Not tested  IX: soft palate elevation  Normal  IX,X: gag reflex Present  XI: trapezius strength  5/5  XI: sternocleidomastoid strength 5/5  XI: neck flexion strength  5/5  XII: tongue strength  Normal    Data Review Lab Results  Component Value Date   WBC 7.3 01/16/2012   HGB 14.6 01/16/2012   HCT 43.6 01/16/2012   MCV 93.8 01/16/2012   PLT 221 01/16/2012   Lab Results  Component Value Date   NA 142 01/16/2012   K 4.5 01/16/2012   CL 102 01/16/2012   CO2 31 01/16/2012   BUN 22 01/16/2012   CREATININE 0.97 01/16/2012   GLUCOSE 105* 01/16/2012   No results found for this basename: INR, PROTIME    Assessment/Plan: L2-3 and L3-4 spinal stenosis, neurogenic claudication, lumbago, lumbar radiculopathy: I discussed situation with the patient. I reviewed his MR scan with them and pointed out the abnormalities. We have discussed the various treatment options. I described the surgical option of an L2 and L3 laminectomy. I have shown him surgical models. We have discussed the risks, benefits, and alternatives surgery as well as likelihood of achieving our goals with surgery. I have answered all the patient's questions. He wants to proceed with surgery.   Ndea Kilroy D 01/22/2012 7:22 AM

## 2012-01-22 NOTE — Preoperative (Signed)
Beta Blockers   Reason not to administer Beta Blockers:Not Applicable 

## 2012-01-22 NOTE — Progress Notes (Signed)
Patient unable to void within 6hr period after surgery, bladder scan showed <50cc, MD on call notified. 250 NS bolus ordered, foley was inserted. Urine draining out of foley. Will continue to monitor

## 2012-01-22 NOTE — Progress Notes (Signed)
Subjective:  [name].  Objective: Vital signs in last 24 hours: Temp:  [96.8 F (36 C)-98.3 F (36.8 C)] 97.5 F (36.4 C) (09/09 1130) Pulse Rate:  [60-84] 61  (09/09 1130) Resp:  [16-37] 18  (09/09 1130) BP: (99-148)/(49-85) 136/64 mmHg (09/09 1130) SpO2:  [97 %-100 %] 100 % (09/09 1130) FiO2 (%):  [2 %] 2 % (09/09 1025) Weight:  [104.327 kg (230 lb)] 104.327 kg (230 lb) (09/09 1130)  Intake/Output from previous day:   Intake/Output this shift: Total I/O In: 1200 [I.V.:1200] Out: 300 [Blood:300]  Physical exam the patient is moving his lower extremities well.  Lab Results: No results found for this basename: WBC:2,HGB:2,HCT:2,PLT:2 in the last 72 hours BMET No results found for this basename: NA:2,K:2,CL:2,CO2:2,GLUCOSE:2,BUN:2,CREATININE:2,CALCIUM:2 in the last 72 hours  Studies/Results: Dg Lumbar Spine 2-3 Views  01/22/2012  *RADIOLOGY REPORT*  Clinical Data: L2-L3 microdiskectomy.  LUMBAR SPINE - 2-3 VIEW  Comparison: MRI from 12/11/2011.  Findings: Same numbering scheme is used for this study as was employed on the lumbar spine MRI  from 12/11/2011.  Cross-table portable lateral view lumbar spine obtained at 0815 hours is labeled #1.  This shows a radiopaque surgical probe overlying the posterior elements of the lumbar spine.  The tip of the probe is positioned at the level of the L3 lower facets. Surgical sponge is noted in the operative bed.  Cross-table portable view of the lumbar spine obtained at 0825 hours is labeled #2.  The tip of a radiopaque surgical probe overlies the posterior elements the lower lumbar spine.  The tip is positioned at the level of L3-4.  Soft tissue retractors are seen in the posterior soft tissues of the lower back, positioned at the level of the L3 pedicles.  IMPRESSION: Intraoperative localization.   Original Report Authenticated By: ERIC A. MANSELL, M.D.     Assessment/Plan: The patient is doing well.  LOS: 0 days      Davon Abdelaziz D 01/22/2012, 2:55 PM

## 2012-01-22 NOTE — Clinical Social Work Note (Signed)
CSW received consult for SNF. CSW reviewed chart. Awaiting PT evals for discharge recommendations. Assessment to follow if needed. Please call with any urgent needs. CSW will continue to follow.   Dede Query, MSW, Theresia Majors [phone]

## 2012-01-22 NOTE — Op Note (Signed)
Brief history: The patient is to 78 year old white male who has suffered from back buttocks and leg pain consistent with neurogenic claudication. He has failed medical management and was worked up with a lumbar MRI. This demonstrated patient had a thoracolumbar scoliosis as well as severe stenosis at L2-3 and L3-4. I discussed the various treatment option with the patient including surgery. He has weighed the risks, benefits, and alternatives surgery decided proceed with a decompressive laminectomy.  Preoperative diagnosis: L2-3  and L3-4 spinal stenosis, scoliosis, lumbar radiculopathy, neurogenic claudication, lumbago  Postoperative diagnosis: The same  Procedure: L2 and L3 laminectomy to decompress the bilateral L2, L3 and L4 nerve roots using microdissection  Surgeon: Dr. Delma Officer  Asst.: Dr. Barnett Abu  Anesthesia: Gen. endotracheal  Estimated blood loss: 200 cc  Drains: None  Complications: None  Description of procedure: The patient was brought to the operating room by the anesthesia team. General endotracheal anesthesia was induced. The patient was turned to the prone position on the Wilson frame. The patient's lumbosacral region was then prepared with Betadine scrub and Betadine solution. Sterile drapes were applied.  I then injected the area to be incised with Marcaine with epinephrine solution. I then used a scalpel to make a linear midline incision over the L2-3 and L3-4 intervertebral disc space. I then used electrocautery to perform a bilateral  subperiosteal dissection exposing the spinous process and lamina of L2, L3 and L4. We obtained intraoperative radiograph to confirm our location. I then inserted the [facility] retractor for exposure.  We then brought the operative microscope into the field. Under its magnification and illumination we completed the microdissection. I used a high-speed drill to perform a laminotomy at L2 and L3 bilaterally. I then used a Kerrison  punches to complete the L2 and L3 laminectomy, remove the L1-2, L2-3 L3-4 ligamentum flavum. We then used microdissection to free up the thecal sac and the L2, L3 and L4 nerve root from the epidural tissue. I then used a Kerrison punch to perform a foraminotomy at about the bilateral L2, L3 and L4 nerve root. We then using the nerve root retractor to gently retract the thecal sac and the L3 and L4 nerve root medially. This exposed the intervertebral disc. It is were bulging but there were no large disc herniations. We did not perform a discectomy.  I then palpated along the ventral surface of the thecal sac and along exit route of the bilateral L2, L3 and L4 nerve root and noted that the neural structures were well decompressed. This completed the decompression.  We then obtained hemostasis using bipolar electrocautery. We irrigated the wound out with bacitracin solution. We then removed the retractor. We then reapproximated the patient's thoracolumbar fascia with interrupted #1 Vicryl suture. We then reapproximated the patient's subcutaneous tissue with interrupted 3-0 Vicryl suture. We then reapproximated patient's skin with Steri-Strips and benzoin. The was then coated with bacitracin ointment. The drapes were removed. The patient was subsequently returned to the supine position where they were extubated by the anesthesia team. The patient was then transported to the postanesthesia care unit in stable condition. All sponge instrument and needle counts were correct at the end of this case.

## 2012-01-23 ENCOUNTER — Encounter (HOSPITAL_COMMUNITY): Payer: Self-pay | Admitting: Neurosurgery

## 2012-01-23 NOTE — Progress Notes (Signed)
Foley reinserted because patient retained urine again. Will continue to monitor.

## 2012-01-23 NOTE — Progress Notes (Signed)
Patient ID: Theodore Padilla, male   DOB: 1922/01/21, 78 y.o.   MRN: [id number] Subjective:  [name]. He has no complaints.  Objective: Vital signs in last 24 hours: Temp:  [96.8 F (36 C)-100 F (37.8 C)] 100 F (37.8 C) (09/10 0600) Pulse Rate:  [60-81] 79  (09/10 0600) Resp:  [16-37] 18  (09/10 0600) BP: (99-136)/(49-85) 130/64 mmHg (09/10 0600) SpO2:  [97 %-100 %] 97 % (09/10 0600) FiO2 (%):  [2 %] 2 % (09/09 1025) Weight:  [104.327 kg (230 lb)] 104.327 kg (230 lb) (09/09 1130)  Intake/Output from previous day: 09/09 0701 - 09/10 0700 In: 1200 [I.V.:1200] Out: 1200 [Urine:900; Blood:300] Intake/Output this shift:    Physical exam the patient is alert and oriented. His strength is grossly normal his lower extremities.  Lab Results: No results found for this basename: WBC:2,HGB:2,HCT:2,PLT:2 in the last 72 hours BMET No results found for this basename: NA:2,K:2,CL:2,CO2:2,GLUCOSE:2,BUN:2,CREATININE:2,CALCIUM:2 in the last 72 hours  Studies/Results: Dg Lumbar Spine 2-3 Views  01/22/2012  *RADIOLOGY REPORT*  Clinical Data: L2-L3 microdiskectomy.  LUMBAR SPINE - 2-3 VIEW  Comparison: MRI from 12/11/2011.  Findings: Same numbering scheme is used for this study as was employed on the lumbar spine MRI  from 12/11/2011.  Cross-table portable lateral view lumbar spine obtained at 0815 hours is labeled #1.  This shows a radiopaque surgical probe overlying the posterior elements of the lumbar spine.  The tip of the probe is positioned at the level of the L3 lower facets. Surgical sponge is noted in the operative bed.  Cross-table portable view of the lumbar spine obtained at 0825 hours is labeled #2.  The tip of a radiopaque surgical probe overlies the posterior elements the lower lumbar spine.  The tip is positioned at the level of L3-4.  Soft tissue retractors are seen in the posterior soft tissues of the lower back, positioned at the level of the L3 pedicles.   IMPRESSION: Intraoperative localization.   Original Report Authenticated By: ERIC A. MANSELL, M.D.     Assessment/Plan: Postop day 1: The patient is doing well neurologically. We will mobilize him with PT and OT.  Urinary retention: We will try to discontinue the Foley today.  LOS: 1 day     Theodore Padilla D 01/23/2012, 7:46 AM

## 2012-01-23 NOTE — Evaluation (Signed)
Occupational Therapy Evaluation Patient Details Name: Theodore Padilla MRN: [id number] DOB: 07-18-1921 Today's Date: 01/23/2012 Time: 4098-1191 OT Time Calculation (min): 29 min  OT Assessment / Plan / Recommendation Clinical Impression  Pt s/p 3 level lumbar laminectomy and presents with generalized weakness, pain and decreased awareness of precautions limiting pt's I with ADLs. Pt with knee buckling x 2 during session. Pt will benefit from skilled OT in the acute setting to maximize I with ADL and ADL mobility prior to d/c.     OT Assessment  Patient needs continued OT Services    Follow Up Recommendations  Skilled nursing facility    Barriers to Discharge Decreased caregiver support    Equipment Recommendations  None recommended by OT    Recommendations for Other Services    Frequency  Min 2X/week    Precautions / Restrictions Precautions Precautions: Fall;Back Precaution Booklet Issued: Yes (comment) Restrictions Weight Bearing Restrictions: No   Pertinent Vitals/Pain Pt reports back pain but did not rate. Pt was premedicated as well repositioned at end of session    ADL  Grooming: Performed;Moderate assistance;Wash/dry hands Where Assessed - Grooming: Supported standing Lower Body Bathing: Simulated;+1 Total assistance Where Assessed - Lower Body Bathing: Supported sit to stand Lower Body Dressing: Performed;+1 Total assistance Where Assessed - Lower Body Dressing: Supported sit to Pharmacist, hospital: Simulated;Moderate assistance Toilet Transfer Method: Sit to Barista:  (from chair with armrests) Toileting - Clothing Manipulation and Hygiene: Simulated;Moderate assistance Where Assessed - Toileting Clothing Manipulation and Hygiene: Sit to stand from 3-in-1 or toilet Equipment Used: Gait belt;Rolling walker Transfers/Ambulation Related to ADLs: pt Min A with RW ambulation. Assist with turns. VC for sequencing and to maintain precautions ADL  Comments: Pt with knee buckling upon sit to stand from chair and while attempting to stand at sink to perform grooming. Pt unable to maintain upright standing at sink (without leaning on sink) secondary to pain and pt with heavy posterior lean and then knee buckling requiring assist to  regain standing. Pt's bil ankles swollen, socks removed at end of session and lotion applied with some massage. Pt encouraged to perform ankle pumps and educated on edema control    OT Diagnosis: Generalized weakness;Acute pain  OT Problem List: Decreased strength;Decreased activity tolerance;Impaired balance (sitting and/or standing);Decreased knowledge of use of DME or AE;Decreased knowledge of precautions;Pain;Increased edema (edema in bil ankles) OT Treatment Interventions: Self-care/ADL training;DME and/or AE instruction;Therapeutic activities;Patient/family education;Balance training   OT Goals Acute Rehab OT Goals OT Goal Formulation: With patient Time For Goal Achievement: 01/30/12 Potential to Achieve Goals: Good ADL Goals Pt Will Perform Grooming: with modified independence;Sitting, chair;Standing at sink ADL Goal: Grooming - Progress: Goal set today Pt Will Perform Upper Body Bathing: with modified independence;Sitting in shower ADL Goal: Upper Body Bathing - Progress: Goal set today Pt Will Perform Lower Body Bathing: with modified independence;Sit to stand in shower ADL Goal: Lower Body Bathing - Progress: Goal set today Pt Will Perform Lower Body Dressing: with modified independence;with adaptive equipment;Sit to stand from chair;Sit to stand from bed ADL Goal: Lower Body Dressing - Progress: Goal set today Pt Will Transfer to Toilet: with modified independence;Ambulation;with DME ADL Goal: Toilet Transfer - Progress: Goal set today Pt Will Perform Toileting - Hygiene: with modified independence;with adaptive equipment;Sit to stand from 3-in-1/toilet ADL Goal: Toileting - Hygiene - Progress: Goal  set today Pt Will Perform Tub/Shower Transfer: Shower transfer;with modified independence;Ambulation;with DME ADL Goal: Tub/Shower Transfer - Progress: Goal set today Additional  ADL Goal #1: Pt will verbalize/generalize 3/3 back precautions for use with all functional activities. ADL Goal: Additional Goal #1 - Progress: Goal set today  Visit Information  Last OT Received On: 01/23/12 Assistance Needed: +1    Subjective Data  Subjective: I think it's the gout in my ankles that keep them so swollen. Patient Stated Goal: Return home   Prior Functioning  Vision/Perception  Home Living Lives With: Alone (wife is bed bound in nursing home) Available Help at Discharge: Friend(s);Available PRN/intermittently Type of Home: House Home Access: Ramped entrance Home Layout: One level Bathroom Shower/Tub: Health visitor: Handicapped height Home Adaptive Equipment: Environmental consultant - rolling;Straight cane Prior Function Level of Independence: Independent Able to Take Stairs?: Yes Driving: Yes Vocation: Retired Musician: No difficulties Dominant Hand: Right      Cognition  Overall Cognitive Status: Appears within functional limits for tasks assessed/performed Arousal/Alertness: Awake/alert Orientation Level: Appears intact for tasks assessed Behavior During Session: [facility] for tasks performed    Extremity/Trunk Assessment Right Upper Extremity Assessment RUE ROM/Strength/Tone: Deficits RUE ROM/Strength/Tone Deficits: generally weak from decreased activity and back sensitivity/soreness. Grossly 4+/5 with poor muscle endurance RUE Sensation: WFL - Light Touch RUE Coordination: WFL - gross/fine motor Left Upper Extremity Assessment LUE ROM/Strength/Tone: Deficits LUE ROM/Strength/Tone Deficits: generally weak from decreased activity and back sensitivity/soreness. Grossly 4+/5 with poor muscle endurance LUE Sensation: WFL - Light Touch LUE Coordination: WFL -  gross/fine motor Right Lower Extremity Assessment RLE ROM/Strength/Tone: WFL for tasks assessed Left Lower Extremity Assessment LLE ROM/Strength/Tone: Deficits LLE ROM/Strength/Tone Deficits: slight buckling noted in standing but pt able to self correct, using RW for extra support   Mobility  Shoulder Instructions  Bed Mobility Bed Mobility: Rolling Right;Right Sidelying to Sit Rolling Right: 4: Min assist Right Sidelying to Sit: 4: Min guard Details for Bed Mobility Assistance: min verbal and tactile cues for sequencing of log roll Transfers Sit to Stand: 3: Mod assist;With armrests;From chair/3-in-1 Stand to Sit: 4: Min assist;With armrests;To chair/3-in-1 Details for Transfer Assistance: Cues for hand placement. Facilitation to achieve upright standing as pt with knees buckling midway through stand       Exercise     Balance     End of Session OT - End of Session Equipment Utilized During Treatment: Gait belt Activity Tolerance: Patient limited by fatigue;Patient limited by pain Patient left: in chair;with call bell/phone within reach Nurse Communication: Mobility status  GO     Itzael Liptak 01/23/2012, 11:03 AM

## 2012-01-23 NOTE — Clinical Social Work Placement (Addendum)
    Clinical Social Work Department CLINICAL SOCIAL WORK PLACEMENT NOTE 01/23/2012  Patient:  Theodore Padilla, Theodore Padilla  Account Number:  [id number] Admit date:  01/22/2012  Clinical Social Worker:  Peggyann Shoals  Date/time:  01/23/2012 01:31 PM  Clinical Social Work is seeking post-discharge placement for this patient at the following level of care:   SKILLED NURSING   (*CSW will update this form in Epic as items are completed)   01/23/2012  Patient/family provided with [facility] Department of Clinical Social Work's list of facilities offering this level of care within the geographic area requested by the patient (or if unable, by the patient's family).  01/23/2012  Patient/family informed of their freedom to choose among providers that offer the needed level of care, that participate in Medicare, Medicaid or managed care program needed by the patient, have an available bed and are willing to accept the patient.  01/23/2012  Patient/family informed of [facility]' ownership interest in [facility], as well as of the fact that they are under no obligation to receive care at this facility.  PASARR submitted to EDS on 01/23/2012 PASARR number received from [employer] on 01/23/2012  FL2 transmitted to all facilities in geographic area requested by pt/family on  01/23/2012 FL2 transmitted to all facilities within larger geographic area on   Patient informed that his/her managed care company has contracts with or will negotiate with  certain facilities, including the following:     Patient/family informed of bed offers received:  01/24/12 Patient chooses bed at  Physician recommends and patient chooses bed at    Patient to be transferred to  on   Patient to be transferred to facility by   The following physician request were entered in Epic:   Additional Comments:

## 2012-01-23 NOTE — Clinical Social Work Psychosocial (Signed)
     Clinical Social Work Department BRIEF PSYCHOSOCIAL ASSESSMENT 01/23/2012  Patient:  Theodore Padilla     Account Number:  [id number]     Admit date:  01/22/2012  Clinical Social Worker:  Peggyann Shoals  Date/Time:  01/23/2012 01:07 PM  Referred by:  Physician  Date Referred:  01/23/2012 Referred for  SNF Placement   Other Referral:   Interview type:  Patient Other interview type:    PSYCHOSOCIAL DATA Living Status:  ALONE Admitted from facility:   Level of care:   Primary support name:  Billye Boiling Primary support relationship to patient:  FRIEND Degree of support available:   Adequate    CURRENT CONCERNS Current Concerns  Post-Acute Placement   Other Concerns:    SOCIAL WORK ASSESSMENT / PLAN CSW met with pt to address consult for SNF. PT is recommending ST-SNF. CSW introduced herself and explained role of social work. CSW also explained the process of SNF placement. Pt shared that he lives alone and his wife is at [facility]. [facility] will be his first choice for SNF.    CSW will initiate SNF referral and contact [facility] directly. CSW will follow up with bed offers. CSW will continue to follow.   Assessment/plan status:  Psychosocial Support/Ongoing Assessment of Needs Other assessment/ plan:   Information/referral to community resources:   SNF List    PATIENTS/FAMILYS RESPONSE TO PLAN OF CARE: Pt was alert and oriented. Pt is agreeable to SNF at discharge.

## 2012-01-23 NOTE — Evaluation (Signed)
Physical Therapy Evaluation Patient Details Name: Theodore Padilla MRN: [id number] DOB: Oct 08, 1921 Today's Date: 01/23/2012 Time: 9147-8295 PT Time Calculation (min): 23 min  PT Assessment / Plan / Recommendation Clinical Impression  Mr. Dolinger is 78 y/o male s/p 3 level lumbar laminectomy. Presents to PT with limited mobility secondary to pain and surgery. Will benefit physical therapy in the acute setting to address the below deficits so as to maximize function for safe d/c. Rec ST-SNF at this time as pt has limited support at home. Will follow acutely.     PT Assessment  Patient needs continued PT services    Follow Up Recommendations  Skilled nursing facility    Barriers to Discharge        Equipment Recommendations  None recommended by PT    Recommendations for Other Services OT consult   Frequency Min 5X/week    Precautions / Restrictions Precautions Precautions: Fall;Back Precaution Booklet Issued: Yes (comment) Restrictions Weight Bearing Restrictions: No         Mobility  Bed Mobility Bed Mobility: Rolling Right;Right Sidelying to Sit Rolling Right: 4: Min assist Right Sidelying to Sit: 4: Min guard Details for Bed Mobility Assistance: min verbal and tactile cues for sequencing of log roll Transfers Transfers: Sit to Stand;Stand to Sit Sit to Stand: 4: Min assist;With upper extremity assist;From bed Stand to Sit: 4: Min assist;With upper extremity assist;To chair/3-in-1 Details for Transfer Assistance: cues for hand placement and facilitation for power up and upright standing, pt with slight buckling initially with right knee but supported by RW Ambulation/Gait Ambulation/Gait Assistance: 4: Min assist Ambulation Distance (Feet): 15 Feet Assistive device: Rolling walker Ambulation/Gait Assistance Details: cues for safe technique with RW as well as upright posture Gait Pattern: Step-through pattern;Trunk flexed General Gait Details: pt tends to push RW too far  anteriorly    Exercises     PT Diagnosis: Difficulty walking;Abnormality of gait;Generalized weakness;Acute pain  PT Problem List: Decreased strength;Decreased activity tolerance;Decreased mobility;Pain;Decreased knowledge of precautions;Decreased knowledge of use of DME PT Treatment Interventions: DME instruction;Gait training;Functional mobility training;Therapeutic activities;Therapeutic exercise;Patient/family education   PT Goals Acute Rehab PT Goals PT Goal Formulation: With patient Time For Goal Achievement: 01/23/12 Potential to Achieve Goals: Good Pt will Roll Supine to Right Side: with modified independence PT Goal: Rolling Supine to Right Side - Progress: Goal set today Pt will Roll Supine to Left Side: with modified independence PT Goal: Rolling Supine to Left Side - Progress: Goal set today Pt will go Supine/Side to Sit: with modified independence PT Goal: Supine/Side to Sit - Progress: Goal set today Pt will go Sit to Supine/Side: with modified independence PT Goal: Sit to Supine/Side - Progress: Goal set today Pt will go Sit to Stand: with modified independence PT Goal: Sit to Stand - Progress: Goal set today Pt will go Stand to Sit: with modified independence PT Goal: Stand to Sit - Progress: Goal set today Pt will Transfer Bed to Chair/Chair to Bed: with modified independence Pt will Ambulate: >150 feet;with modified independence;with least restrictive assistive device PT Goal: Ambulate - Progress: Goal set today Additional Goals Additional Goal #1: Pt will verbalize and demonstrate knowledge of 3/3 back precautions.  PT Goal: Additional Goal #1 - Progress: Goal set today  Visit Information  Last PT Received On: 01/23/12 Assistance Needed: +1    Subjective Data  Subjective: I live alone, my wife is at a nursing home.  Patient Stated Goal: home   Prior Functioning  Home Living Lives With: Alone (  wife is bed bound in nursing home) Available Help at Discharge:  Friend(s);Available PRN/intermittently Type of Home: House Home Access: Ramped entrance Home Layout: One level Bathroom Toilet: Standard Home Adaptive Equipment: Walker - rolling;Straight cane Prior Function Level of Independence: Independent Able to Take Stairs?: Yes Driving: Yes Vocation: Retired Musician: No difficulties    Cognition  Overall Cognitive Status: Appears within functional limits for tasks assessed/performed Arousal/Alertness: Awake/alert Orientation Level: Appears intact for tasks assessed Behavior During Session: [facility] for tasks performed    Extremity/Trunk Assessment Right Upper Extremity Assessment RUE ROM/Strength/Tone: Within functional levels Left Upper Extremity Assessment LUE ROM/Strength/Tone: Within functional levels Right Lower Extremity Assessment RLE ROM/Strength/Tone: WFL for tasks assessed Left Lower Extremity Assessment LLE ROM/Strength/Tone: Deficits LLE ROM/Strength/Tone Deficits: slight buckling noted in standing but pt able to self correct, using RW for extra support   Balance    End of Session PT - End of Session Equipment Utilized During Treatment: Gait belt Activity Tolerance: Patient tolerated treatment well Patient left: in chair;with call bell/phone within reach Nurse Communication: Mobility status  GP     [facility] HELEN 01/23/2012, 9:19 AM

## 2012-01-24 MED ORDER — BISACODYL 10 MG RE SUPP
10.0000 mg | Freq: Every day | RECTAL | Status: DC | PRN
  Administered 2012-01-24 – 2012-01-25 (×2): 10 mg via RECTAL
  Filled 2012-01-24 (×3): qty 1

## 2012-01-24 MED ORDER — FLEET ENEMA 7-19 GM/118ML RE ENEM
1.0000 | ENEMA | Freq: Every day | RECTAL | Status: DC | PRN
Start: 2012-01-24 — End: 2012-01-26
  Administered 2012-01-25: 1 via RECTAL
  Filled 2012-01-24: qty 1

## 2012-01-24 NOTE — Progress Notes (Signed)
Patient ID: Theodore Padilla, male   DOB: 03/21/1922, 78 y.o.   MRN: [id number] Subjective:  [name]. He complains of some abdominal bloating. He had some nausea vomiting. He denies passing flatus. The patient complains of constipation  Objective: Vital signs in last 24 hours: Temp:  [98 F (36.7 C)-98.9 F (37.2 C)] 98.4 F (36.9 C) (09/11 0605) Pulse Rate:  [65-90] 90  (09/11 0605) Resp:  [18] 18  (09/11 0605) BP: (111-143)/(59-70) 140/65 mmHg (09/11 0605) SpO2:  [93 %-100 %] 100 % (09/11 0605)  Intake/Output from previous day: 09/10 0701 - 09/11 0700 In: -  Out: 800 [Urine:800] Intake/Output this shift:    Physical exam the patient is alert and oriented. His strength is normal his lower extremities. His abdomen is soft and nontender but a bit protuberant.  Lab Results: No results found for this basename: WBC:2,HGB:2,HCT:2,PLT:2 in the last 72 hours BMET No results found for this basename: NA:2,K:2,CL:2,CO2:2,GLUCOSE:2,BUN:2,CREATININE:2,CALCIUM:2 in the last 72 hours  Studies/Results: No results found.  Assessment/Plan: Postop day #2: We'll continue to mobilize the patient with PT and OT.  Possible ileus: Hopefully this will resolve with mobilization.  Constipation: We will add when necessary booklet suppository and fleets enema.  LOS: 2 days     Kamali Sakata D 01/24/2012, 10:24 AM

## 2012-01-24 NOTE — Progress Notes (Signed)
Physical Therapy Treatment Patient Details Name: Theodore Padilla MRN: [id number] DOB: 1922/02/09 Today's Date: 01/24/2012 Time: 4782-9562 PT Time Calculation (min): 24 min  PT Assessment / Plan / Recommendation Comments on Treatment Session  Improved gait today but still c/o weakness to bilateral lower extremities limiting ambulation distance. Sit<->stand remains difficult for this patient so practiced x2. Reviewed back precautions employing teach back method and pt able to verbalize 2/3. Pt now agreeable to ST-SNF after discussion of pt safety. Pt aware that he should only be getting up with staff assistance but that ambulating  multiple times today is ideal.     Follow Up Recommendations  Skilled nursing facility    Barriers to Discharge        Equipment Recommendations  None recommended by PT    Recommendations for Other Services    Frequency Min 5X/week   Plan Discharge plan remains appropriate;Frequency remains appropriate    Precautions / Restrictions Precautions Precautions: Back;Fall Precaution Booklet Issued: Yes (comment)       Mobility  Transfers Transfers: Sit to Stand;Stand to Sit Sit to Stand: 4: Min assist;With upper extremity assist;With armrests;From chair/3-in-1 Stand to Sit: 4: Min guard;With armrests;To chair/3-in-1;With upper extremity assist Details for Transfer Assistance: cues for safe hand placement and facilitation to complete stand (slow to stand needing upper extremity support on RW for full knee/trunk extension); sit<>stand x2 for increased strengthening and practice of technique Ambulation/Gait Ambulation/Gait Assistance: 4: Min guard;4: Min assist Ambulation Distance (Feet): 60 Feet Assistive device: Rolling walker Ambulation/Gait Assistance Details: cues for upright posture and safe technique with RW, as pt fatigues he became more impulsive and flexes forward more needing increased safety cues Gait Pattern: Trunk flexed General Gait Details: tends  towards RW too far anteriorly    Exercises General Exercises - Lower Extremity Toe Raises: AROM;Both;20 reps;Seated Heel Raises: AROM;Both;20 reps;Seated     PT Goals Acute Rehab PT Goals PT Goal: Sit to Stand - Progress: Progressing toward goal PT Goal: Stand to Sit - Progress: Progressing toward goal PT Transfer Goal: Bed to Chair/Chair to Bed - Progress: Progressing toward goal PT Goal: Ambulate - Progress: Progressing toward goal Additional Goals PT Goal: Additional Goal #1 - Progress: Progressing toward goal  Visit Information  Last PT Received On: 01/24/12 Assistance Needed: +1    Subjective Data  Subjective: I think I am going to have to go to rehab before I go home but I would rather go to the place where my wife lives.    Cognition  Overall Cognitive Status: Appears within functional limits for tasks assessed/performed Arousal/Alertness: Awake/alert Orientation Level: Appears intact for tasks assessed Behavior During Session: [facility] for tasks performed    Balance     End of Session PT - End of Session Equipment Utilized During Treatment: Gait belt Activity Tolerance: Patient tolerated treatment well Patient left: in chair;with call bell/phone within reach Nurse Communication: Mobility status   GP     [facility] HELEN 01/24/2012, 9:06 AM

## 2012-01-24 NOTE — Progress Notes (Signed)
Clinical Social Work  CSW met with patient at bedside to give bed offers. [facility] offered a bed and [facility] offered a bed but only if patient is dc after Saturday. Patient is most interested in [facility]. CSW called [facility] and left a message regarding patient's interest. CSW will continue to follow.  Unk Lightning, LCSW  (Coverage for Dede Query)

## 2012-01-25 MED ORDER — TAMSULOSIN HCL 0.4 MG PO CAPS
0.4000 mg | ORAL_CAPSULE | Freq: Every day | ORAL | Status: DC
  Administered 2012-01-25: 0.4 mg via ORAL
  Filled 2012-01-25 (×2): qty 1

## 2012-01-25 NOTE — Progress Notes (Signed)
Patient ID: Theodore Padilla, male   DOB: January 28, 1922, 78 y.o.   MRN: [id number] Subjective:  [name]. He looks well. His back is appropriately sore. He still having urinary retention. He wants to go to [facility] in [place]. He still complains of constipation.  Objective: Vital signs in last 24 hours: Temp:  [97.9 F (36.6 C)-99 F (37.2 C)] 98.1 F (36.7 C) (09/12 0600) Pulse Rate:  [67-91] 67  (09/12 0600) Resp:  [18] 18  (09/12 0600) BP: (103-121)/(37-62) 116/52 mmHg (09/12 0952) SpO2:  [93 %-99 %] 98 % (09/12 0600)  Intake/Output from previous day: 09/11 0701 - 09/12 0700 In: -  Out: 1400 [Urine:1400] Intake/Output this shift:    Physical exam the patient is alert and oriented. His lower extremity strength is grossly normal. His dressing is clean and dry.  Lab Results: No results found for this basename: WBC:2,HGB:2,HCT:2,PLT:2 in the last 72 hours BMET No results found for this basename: NA:2,K:2,CL:2,CO2:2,GLUCOSE:2,BUN:2,CREATININE:2,CALCIUM:2 in the last 72 hours  Studies/Results: No results found.  Assessment/Plan: Postop day #3: the patient is doing well neurologically.  Urinary retention: I will ask the urologist to see the patient.  Constipation: When necessary suppositories and enemas.  LOS: 3 days     Theodore Padilla D 01/25/2012, 10:02 AM

## 2012-01-25 NOTE — Progress Notes (Signed)
Physical Therapy Treatment Patient Details Name: Theodore Padilla MRN: [id number] DOB: [birth year] Today's Date: 01/25/2012 Time: 2130-8657 PT Time Calculation (min): 26 min  PT Assessment / Plan / Recommendation Comments on Treatment Session  Still concerned about no bowel movement. Also complaining of numbness in his legs today. Reviewed back precautions again today with teach back method as pt unable to state any of the precautions but was able to remember the acronym BAT. Encouraged pt to ambulate at least two more times today with staff.     Follow Up Recommendations  Skilled nursing facility    Barriers to Discharge        Equipment Recommendations  Defer to next venue    Recommendations for Other Services    Frequency Min 5X/week   Plan Discharge plan remains appropriate;Frequency remains appropriate    Precautions / Restrictions Precautions Precautions: Back;Fall Precaution Booklet Issued: Yes (comment)       Mobility  Transfers Sit to Stand: 4: Min assist;From toilet;From elevated surface;With upper extremity assist Stand to Sit: 4: Min guard;With armrests;To chair/3-in-1;With upper extremity assist Details for Transfer Assistance: cues for safe hand placement and decreased bending for standing (although pt reports he can't stand up any straighter because of his weak legs) Ambulation/Gait Ambulation/Gait Assistance: 4: Min guard Ambulation Distance (Feet): 80 Feet Assistive device: Rolling walker Ambulation/Gait Assistance Details: cues for safe technique with RW especially safe proximity and proper hand placement/grip as well as tall posture (pt reporting that if he stands up straight it hurts too much so I educated him on gradually standing up straighter every day to improve ROM)  Gait Pattern: Trunk flexed;Step-through pattern    Exercises General Exercises - Lower Extremity Toe Raises: AROM;Both;10 reps;Seated Heel Raises: AROM;Both;10 reps;Seated    PT  Goals Acute Rehab PT Goals PT Goal: Sit to Stand - Progress: Progressing toward goal PT Goal: Stand to Sit - Progress: Progressing toward goal PT Transfer Goal: Bed to Chair/Chair to Bed - Progress: Progressing toward goal PT Goal: Ambulate - Progress: Progressing toward goal Additional Goals PT Goal: Additional Goal #1 - Progress: Progressing toward goal  Visit Information  Last PT Received On: 01/25/12 Assistance Needed: +1    Subjective Data  Subjective: Im gonn make a call at [facility] and see if i can pull some strings to get me into rehab there.    Cognition  Overall Cognitive Status: Appears within functional limits for tasks assessed/performed Arousal/Alertness: Awake/alert Orientation Level: Appears intact for tasks assessed Behavior During Session: [facility] for tasks performed    Balance     End of Session PT - End of Session Equipment Utilized During Treatment: Gait belt Activity Tolerance: Patient tolerated treatment well;Patient limited by pain Patient left: in chair;with call bell/phone within reach Nurse Communication: Mobility status   GP     [facility] HELEN 01/25/2012, 9:03 AM

## 2012-01-26 MED ORDER — TAMSULOSIN HCL 0.4 MG PO CAPS: 0.4000 mg | ORAL_CAPSULE | Freq: Every day | ORAL | Status: DC

## 2012-01-26 MED ORDER — DIAZEPAM 2 MG PO TABS: 2.0000 mg | ORAL_TABLET | Freq: Four times a day (QID) | ORAL | Status: AC | PRN

## 2012-01-26 MED ORDER — OXYCODONE-ACETAMINOPHEN 5-325 MG PO TABS: 1.0000 | ORAL_TABLET | ORAL | Status: AC | PRN

## 2012-01-26 NOTE — Care Management Note (Signed)
    Page 1 of 1   01/26/2012     3:38:54 PM   CARE MANAGEMENT NOTE 01/26/2012  Patient:  [facility]   Account Number:  [id number]  Date Initiated:  01/23/2012  Documentation initiated by:  [facility]  Subjective/Objective Assessment:   Admitted postop L2-3 laminectomy.     Action/Plan:   PT/OT evals-recommending SNF   Anticipated DC Date:  01/26/2012   Anticipated DC Plan:  SKILLED NURSING FACILITY  In-house referral  Clinical Social Worker      DC Planning Services  CM consult      Choice offered to / List presented to:             Status of service:  Completed, signed off Medicare Important Message given?   (If response is "NO", the following Medicare IM given date fields will be blank) Date Medicare IM given:   Date Additional Medicare IM given:    Discharge Disposition:  SKILLED NURSING FACILITY  Per UR Regulation:  Reviewed for med. necessity/level of care/duration of stay  If discussed at Long Length of Stay Meetings, dates discussed:    Comments:  01/26/12 discharged to [facility]

## 2012-01-26 NOTE — Progress Notes (Signed)
Patient ID: Theodore Padilla, male   DOB: 20-Feb-1922, 78 y.o.   MRN: [id number] Subjective:  [name]. He is still having urinary retention. He wants to go to [facility] rehabilitation where his wife is.  Objective: Vital signs in last 24 hours: Temp:  [98.2 F (36.8 C)-98.7 F (37.1 C)] 98.3 F (36.8 C) (09/13 0644) Pulse Rate:  [65-83] 70  (09/13 0644) Resp:  [18-20] 18  (09/13 0644) BP: (98-136)/(43-74) 132/74 mmHg (09/13 0644) SpO2:  [95 %-100 %] 100 % (09/13 0644)  Intake/Output from previous day: 09/12 0701 - 09/13 0700 In: -  Out: 1150 [Urine:1150] Intake/Output this shift:    Physical exam the patient is alert and oriented. His wound is healing well without signs of infection. His strength is normal his lower extremities.  Lab Results: No results found for this basename: WBC:2,HGB:2,HCT:2,PLT:2 in the last 72 hours BMET No results found for this basename: NA:2,K:2,CL:2,CO2:2,GLUCOSE:2,BUN:2,CREATININE:2,CALCIUM:2 in the last 72 hours  Studies/Results: No results found.  Assessment/Plan: Postop day #4: The patient is doing well and ready for transfer to a skilled nursing facility preferably [facility]  LOS: 4 days     Theodore Padilla D 01/26/2012, 7:51 AM

## 2012-01-26 NOTE — Progress Notes (Signed)
Report called to SNF.  Pt being transported by private vehicle, packet sent with patient.

## 2012-01-26 NOTE — Progress Notes (Signed)
Clinical Social Worker facilitated pt discharge needs including contacting facility, faxing pt discharge information via [facility], discussing with pt at bedside and pt does not want ambulance transportation, but wants pt friend to take pt via pt van. Clinical Social Worker spoke with pt friend, Mr. Aron Baba who can provide transportation for pt and planned to transport pt at 3pm. Clinical Social Worker provided contact number to RN to call report. Pt discharge packet provided in pt room to be given to pt friend at transport. No further social work needs identified at this time. Clinical Social Worker signing off.   Jacklynn Lewis, MSW, LCSWA  Clinical Social Work [phone]

## 2012-01-26 NOTE — Progress Notes (Signed)
Physical Therapy Treatment Patient Details Name: Theodore Padilla MRN: [id number] DOB: 13-May-1922 Today's Date: 01/26/2012 Time: 6578-4696 PT Time Calculation (min): 15 min  PT Assessment / Plan / Recommendation Comments on Treatment Session  Plan for d/c to SNF today. Pt reports he has been getting up to the bathroom with nursing but otherwise has not been doing any walking. Improved distance for gait today.     Follow Up Recommendations  Skilled nursing facility    Barriers to Discharge        Equipment Recommendations  Rolling walker with 5" wheels    Recommendations for Other Services    Frequency     Plan Discharge plan remains appropriate;Frequency remains appropriate    Precautions / Restrictions Precautions Precautions: Fall;Back Precaution Booklet Issued: Yes (comment)       Mobility  Transfers Sit to Stand: 4: Min guard;With upper extremity assist;With armrests;From chair/3-in-1 Stand to Sit: With upper extremity assist;4: Min guard;With armrests;To chair/3-in-1 Details for Transfer Assistance: cues for safe hand placement but better power up to stand needing no physical assist today Ambulation/Gait Ambulation/Gait Assistance: 4: Min guard Ambulation Distance (Feet): 150 Feet Assistive device: Rolling walker Ambulation/Gait Assistance Details: improved distance today, still needing cues for safety with RW specifically tall posture and hand placement; as pt fatigues he flexes forward more  Gait Pattern: Step-through pattern;Trunk flexed      PT Goals Acute Rehab PT Goals PT Goal: Sit to Stand - Progress: Progressing toward goal PT Goal: Stand to Sit - Progress: Progressing toward goal PT Transfer Goal: Bed to Chair/Chair to Bed - Progress: Progressing toward goal PT Goal: Ambulate - Progress: Progressing toward goal Additional Goals PT Goal: Additional Goal #1 - Progress: Progressing toward goal  Visit Information  Last PT Received On: 01/26/12 Assistance  Needed: +1    Subjective Data  Subjective: I get a lot of hurry up and wait around here, I was supposed to go to rehab at 11 this morning.    Cognition  Overall Cognitive Status: Impaired Area of Impairment: Memory Arousal/Alertness: Awake/alert Orientation Level: Appears intact for tasks assessed Behavior During Session: [facility] for tasks performed Memory: Decreased recall of precautions    Balance     End of Session PT - End of Session Equipment Utilized During Treatment: Gait belt Activity Tolerance: Patient tolerated treatment well Patient left: in chair;with call bell/phone within reach Nurse Communication: Mobility status   GP     [facility] HELEN 01/26/2012, 3:16 PM

## 2012-01-26 NOTE — Progress Notes (Signed)
Clinical Social Worker reviewed chart and noted pt medically stable for discharge. Clinical Social Worker aware pt first choice is [facility]. Clinical Social Worker contacted facility who confirmed bed availability for today and requesting dc summary and dc meds as soon as possible. Noted dc meds available, but not yet dc summary. Will notify MD of bed availability for today and facilitate pt discharge needs this afternoon.  Jacklynn Lewis, MSW, LCSWA  Clinical Social Work [phone]

## 2012-02-01 NOTE — Discharge Summary (Signed)
Physician Discharge Summary  Patient ID: Theodore Padilla MRN: [id number] DOB/AGE: [birth year] 76 y.o.  Admit date: 01/22/2012 Discharge date: 02/01/2012 01/26/12  Admission Diagnoses: L2-3 and L3-4 spinal stenosis, lumbar radiculopathy, neurogenic claudication, lumbago  Discharge Diagnoses: The same Active Problems:  * No active hospital problems. *    Discharged Condition: good  Hospital Course: I admitted the patient to [facility] on 01/22/2012. On that day I performed an L2-3 and L3-4 decompressive laminectomy. The surgery were well.  The patient's postoperative course was unremarkable. He lives alone and was somewhat slow to mobilize. We arrange for skilled nursing facility placement and was transferred there on 01/26/2012. He was given oral and written discharge instructions. All his questions were answered.  Consults: None Significant Diagnostic Studies: None Treatments: L3-4 and L2-3 decompressive laminectomy Discharge Exam: Blood pressure 95/69, pulse 76, temperature 98 F (36.7 C), temperature source Oral, resp. rate 18, height 5\' 6"  (1.676 m), weight 104.327 kg (230 lb), SpO2 98.00%. The patient is alert and oriented. His lower extremity strength is normal. His wound is healing well.  Disposition: Skilled nursing facility.  Discharge Orders    Future Orders Please Complete By Expires   Diet - low sodium heart healthy      Increase activity slowly      Discharge instructions      Comments:   Call [phone] for followup appointment. Follow up with the urologist Dr. Wynelle Link next week.   No dressing needed      Call MD for:  temperature >100.4      Call MD for:  persistant nausea and vomiting      Call MD for:  severe uncontrolled pain      Call MD for:  redness, tenderness, or signs of infection (pain, swelling, redness, odor or green/yellow discharge around incision site)      Call MD for:  difficulty breathing, headache or visual disturbances      Call MD  for:  hives      Call MD for:  persistant dizziness or light-headedness      Call MD for:  extreme fatigue          Medication List     As of 02/01/2012  9:23 AM    STOP taking these medications         HYDROcodone-acetaminophen 7.5-325 MG per tablet   Commonly known as: NORCO      TAKE these medications         allopurinol 100 MG tablet   Commonly known as: ZYLOPRIM   Take 100 mg by mouth daily.      aspirin 81 MG tablet   Take 81 mg by mouth daily.      diazepam 2 MG tablet   Commonly known as: VALIUM   Take 1 tablet (2 mg total) by mouth every 6 (six) hours as needed.      docusate sodium 100 MG capsule   Commonly known as: COLACE   Take 100 mg by mouth daily.      lisinopril-hydrochlorothiazide 10-12.5 MG per tablet   Commonly known as: PRINZIDE,ZESTORETIC   Take 1 tablet by mouth daily.      oxyCODONE-acetaminophen 5-325 MG per tablet   Commonly known as: PERCOCET/ROXICET   Take 1-2 tablets by mouth every 4 (four) hours as needed.      Tamsulosin HCl 0.4 MG Caps   Commonly known as: FLOMAX   Take 1 capsule (0.4 mg total) by mouth at bedtime.  SignedCristi Loron 02/01/2012, 9:23 AM

## 2012-02-05 ENCOUNTER — Emergency Department: Payer: Self-pay | Admitting: Emergency Medicine

## 2012-02-05 LAB — COMPREHENSIVE METABOLIC PANEL
Anion Gap: 8 (ref 7–16)
Bilirubin,Total: 0.6 mg/dL (ref 0.2–1.0)
Calcium, Total: 9.1 mg/dL (ref 8.5–10.1)
Creatinine: 1.23 mg/dL (ref 0.60–1.30)
EGFR (African American): >60
Osmolality: 286 (ref 275–301)
SGOT(AST): 15 U/L (ref 15–37)
Total Protein: 7.1 g/dL (ref 6.4–8.2)

## 2012-02-05 LAB — URINALYSIS, COMPLETE
Bilirubin,UR: NEGATIVE
Glucose,UR: NEGATIVE mg/dL (ref 0–75)
Nitrite: NEGATIVE
Ph: 6 (ref 4.5–8.0)
Protein: 100
RBC,UR: 44 /HPF (ref 0–5)
Squamous Epithelial: <1
WBC UR: 14 /HPF (ref 0–5)

## 2012-02-05 LAB — CBC
HCT: 36.2 % — ABNORMAL LOW (ref 40.0–52.0)
HGB: 12.1 g/dL — ABNORMAL LOW (ref 13.0–18.0)
RBC: 3.91 10*6/uL — ABNORMAL LOW (ref 4.40–5.90)
RDW: 14.1 % (ref 11.5–14.5)

## 2012-02-06 LAB — URINE CULTURE

## 2012-10-12 ENCOUNTER — Emergency Department: Payer: Self-pay | Admitting: Emergency Medicine

## 2012-10-12 LAB — COMPREHENSIVE METABOLIC PANEL
Albumin: 3.2 g/dL — ABNORMAL LOW (ref 3.4–5.0)
Alkaline Phosphatase: 86 U/L (ref 50–136)
Anion Gap: 5 — ABNORMAL LOW (ref 7–16)
BUN: 28 mg/dL — ABNORMAL HIGH (ref 7–18)
Co2: 29 mmol/L (ref 21–32)
Creatinine: 1.19 mg/dL (ref 0.60–1.30)
EGFR (Non-African Amer.): 54 — ABNORMAL LOW
Glucose: 125 mg/dL — ABNORMAL HIGH (ref 65–99)
Osmolality: 281 (ref 275–301)

## 2012-10-12 LAB — CBC
HCT: 41.4 % (ref 40.0–52.0)
MCH: 31.9 pg (ref 26.0–34.0)
MCV: 94 fL (ref 80–100)
Platelet: 230 10*3/uL (ref 150–440)
WBC: 7.9 10*3/uL (ref 3.8–10.6)

## 2012-10-12 LAB — PROTIME-INR
INR: 1.1
Prothrombin Time: 14.4 secs (ref 11.5–14.7)

## 2012-10-12 LAB — TROPONIN I: Troponin-I: <0.02 ng/mL

## 2012-10-12 LAB — APTT: Activated PTT: 33.9 secs (ref 23.6–35.9)

## 2012-10-24 ENCOUNTER — Ambulatory Visit: Payer: Self-pay | Admitting: Hematology and Oncology

## 2013-09-11 ENCOUNTER — Observation Stay: Payer: Self-pay | Admitting: Internal Medicine

## 2013-09-11 LAB — D-DIMER(ARMC): D-DIMER: 1485 ng/mL

## 2013-09-11 LAB — CBC
HCT: 42.8 % (ref 40.0–52.0)
HGB: 14 g/dL (ref 13.0–18.0)
MCH: 31.2 pg (ref 26.0–34.0)
MCHC: 32.7 g/dL (ref 32.0–36.0)
MCV: 95 fL (ref 80–100)
PLATELETS: 229 10*3/uL (ref 150–440)
RBC: 4.49 10*6/uL (ref 4.40–5.90)
RDW: 14 % (ref 11.5–14.5)
WBC: 6.1 10*3/uL (ref 3.8–10.6)

## 2013-09-11 LAB — CK-MB
CK-MB: 2.8 ng/mL (ref 0.5–3.6)
CK-MB: 2.5 ng/mL (ref 0.5–3.6)

## 2013-09-11 LAB — COMPREHENSIVE METABOLIC PANEL
AST: 28 U/L (ref 15–37)
Albumin: 3.5 g/dL (ref 3.4–5.0)
Alkaline Phosphatase: 74 U/L
Anion Gap: 6 — ABNORMAL LOW (ref 7–16)
BUN: 32 mg/dL — ABNORMAL HIGH (ref 7–18)
Bilirubin,Total: 0.5 mg/dL (ref 0.2–1.0)
CALCIUM: 8.6 mg/dL (ref 8.5–10.1)
CHLORIDE: 105 mmol/L (ref 98–107)
CO2: 27 mmol/L (ref 21–32)
Creatinine: 1.09 mg/dL (ref 0.60–1.30)
EGFR (African American): >60
EGFR (Non-African Amer.): 60 — ABNORMAL LOW
GLUCOSE: 106 mg/dL — AB (ref 65–99)
OSMOLALITY: 283 (ref 275–301)
Potassium: 4 mmol/L (ref 3.5–5.1)
SGPT (ALT): 20 U/L (ref 12–78)
Sodium: 138 mmol/L (ref 136–145)
Total Protein: 7.3 g/dL (ref 6.4–8.2)

## 2013-09-11 LAB — TSH: THYROID STIMULATING HORM: 1.04 u[IU]/mL

## 2013-09-11 LAB — TROPONIN I: Troponin-I: <0.02 ng/mL

## 2013-09-11 LAB — LIPID PANEL
Cholesterol: 187 mg/dL (ref 0–200)
HDL: 37 mg/dL — AB (ref 40–60)
LDL CHOLESTEROL, CALC: 132 mg/dL — AB (ref 0–100)
TRIGLYCERIDES: 90 mg/dL (ref 0–200)
VLDL CHOLESTEROL, CALC: 18 mg/dL (ref 5–40)

## 2013-09-11 LAB — PRO B NATRIURETIC PEPTIDE: B-TYPE NATIURETIC PEPTID: 64 pg/mL (ref 0–450)

## 2013-09-12 LAB — CK-MB: CK-MB: 2.7 ng/mL (ref 0.5–3.6)

## 2013-09-12 LAB — URINALYSIS, COMPLETE
BLOOD: NEGATIVE
Bilirubin,UR: NEGATIVE
GLUCOSE, UR: NEGATIVE mg/dL (ref 0–75)
KETONE: NEGATIVE
LEUKOCYTE ESTERASE: NEGATIVE
Nitrite: NEGATIVE
PROTEIN: NEGATIVE
Ph: 5 (ref 4.5–8.0)
Specific Gravity: 1.039 (ref 1.003–1.030)
WBC UR: 1 /HPF (ref 0–5)

## 2013-09-12 LAB — TROPONIN I: Troponin-I: <0.02 ng/mL

## 2013-09-14 ENCOUNTER — Ambulatory Visit: Payer: Self-pay | Admitting: Hematology and Oncology

## 2013-09-19 ENCOUNTER — Ambulatory Visit: Payer: Self-pay | Admitting: Internal Medicine

## 2013-09-24 DIAGNOSIS — E669 Obesity, unspecified: Secondary | ICD-10-CM | POA: Insufficient documentation

## 2013-09-24 DIAGNOSIS — M47816 Spondylosis without myelopathy or radiculopathy, lumbar region: Secondary | ICD-10-CM | POA: Insufficient documentation

## 2013-09-24 DIAGNOSIS — E785 Hyperlipidemia, unspecified: Secondary | ICD-10-CM | POA: Insufficient documentation

## 2013-09-24 DIAGNOSIS — N200 Calculus of kidney: Secondary | ICD-10-CM | POA: Insufficient documentation

## 2013-09-24 DIAGNOSIS — I10 Essential (primary) hypertension: Secondary | ICD-10-CM | POA: Insufficient documentation

## 2013-09-24 DIAGNOSIS — I2510 Atherosclerotic heart disease of native coronary artery without angina pectoris: Secondary | ICD-10-CM | POA: Insufficient documentation

## 2013-10-01 DIAGNOSIS — R0602 Shortness of breath: Secondary | ICD-10-CM | POA: Insufficient documentation

## 2013-10-01 DIAGNOSIS — R079 Chest pain, unspecified: Secondary | ICD-10-CM | POA: Insufficient documentation

## 2014-03-12 DIAGNOSIS — K5909 Other constipation: Secondary | ICD-10-CM | POA: Insufficient documentation

## 2014-08-29 ENCOUNTER — Inpatient Hospital Stay
Admit: 2014-08-29 | Disposition: A | Discharge status: Home / Self Care | Payer: Self-pay | Attending: Internal Medicine | Admitting: Internal Medicine

## 2014-08-29 LAB — CBC WITH DIFFERENTIAL/PLATELET
BASOS ABS: 0 10*3/uL (ref 0.0–0.1)
Basophil %: 0.4 %
Eosinophil #: 0.1 10*3/uL (ref 0.0–0.7)
Eosinophil %: 1.3 %
HCT: 32.3 % — AB (ref 40.0–52.0)
HGB: 10.9 g/dL — AB (ref 13.0–18.0)
Lymphocyte #: 1.7 10*3/uL (ref 1.0–3.6)
Lymphocyte %: 15.7 %
MCH: 32.4 pg (ref 26.0–34.0)
MCHC: 33.8 g/dL (ref 32.0–36.0)
MCV: 96 fL (ref 80–100)
MONO ABS: 0.6 x10 3/mm (ref 0.2–1.0)
Monocyte %: 5.9 %
NEUTROS ABS: 8.3 10*3/uL — AB (ref 1.4–6.5)
Neutrophil %: 76.7 %
Platelet: 237 10*3/uL (ref 150–440)
RBC: 3.37 10*6/uL — ABNORMAL LOW (ref 4.40–5.90)
RDW: 13.7 % (ref 11.5–14.5)
WBC: 10.8 10*3/uL — ABNORMAL HIGH (ref 3.8–10.6)

## 2014-08-29 LAB — COMPREHENSIVE METABOLIC PANEL
ANION GAP: 9 (ref 7–16)
Albumin: 3.1 g/dL — ABNORMAL LOW
Alkaline Phosphatase: 50 U/L
BUN: 91 mg/dL — AB
Bilirubin,Total: 0.5 mg/dL
CHLORIDE: 103 mmol/L
CREATININE: 1.86 mg/dL — AB
Calcium, Total: 8.3 mg/dL — ABNORMAL LOW
Co2: 25 mmol/L
GFR CALC AF AMER: 36 — AB
GFR CALC NON AF AMER: 31 — AB
Glucose: 149 mg/dL — ABNORMAL HIGH
Potassium: 4.6 mmol/L
SGOT(AST): 19 U/L
SGPT (ALT): 14 U/L — ABNORMAL LOW
Sodium: 137 mmol/L
Total Protein: 5.8 g/dL — ABNORMAL LOW

## 2014-08-29 LAB — URINALYSIS, COMPLETE
BACTERIA: NONE SEEN
BILIRUBIN, UR: NEGATIVE
GLUCOSE, UR: NEGATIVE mg/dL (ref 0–75)
Ketone: NEGATIVE
LEUKOCYTE ESTERASE: NEGATIVE
Nitrite: NEGATIVE
PH: 5 (ref 4.5–8.0)
Protein: NEGATIVE
Specific Gravity: 1.013 (ref 1.003–1.030)

## 2014-08-29 LAB — HEMOGLOBIN
HGB: 10.2 g/dL — AB (ref 13.0–18.0)
HGB: 9.5 g/dL — ABNORMAL LOW (ref 13.0–18.0)

## 2014-08-29 LAB — PROTIME-INR
INR: 1.1
PROTHROMBIN TIME: 14.8 s

## 2014-08-29 LAB — LIPASE, BLOOD: Lipase: 27 U/L

## 2014-08-29 LAB — APTT: Activated PTT: 29.2 secs (ref 23.6–35.9)

## 2014-08-30 LAB — BASIC METABOLIC PANEL
Anion Gap: 5 — ABNORMAL LOW (ref 7–16)
BUN: 72 mg/dL — AB
CO2: 23 mmol/L
CREATININE: 1.15 mg/dL
Calcium, Total: 7.9 mg/dL — ABNORMAL LOW
Chloride: 111 mmol/L
EGFR (Non-African Amer.): 56 — ABNORMAL LOW
GLUCOSE: 106 mg/dL — AB
Potassium: 4.4 mmol/L
SODIUM: 139 mmol/L

## 2014-08-30 LAB — CBC WITH DIFFERENTIAL/PLATELET
BASOS ABS: 0 10*3/uL (ref 0.0–0.1)
Basophil %: 0.5 %
EOS PCT: 2.8 %
Eosinophil #: 0.2 10*3/uL (ref 0.0–0.7)
HCT: 26.4 % — ABNORMAL LOW (ref 40.0–52.0)
HGB: 8.8 g/dL — ABNORMAL LOW (ref 13.0–18.0)
LYMPHS ABS: 1.4 10*3/uL (ref 1.0–3.6)
Lymphocyte %: 17.4 %
MCH: 32.3 pg (ref 26.0–34.0)
MCHC: 33.5 g/dL (ref 32.0–36.0)
MCV: 97 fL (ref 80–100)
MONOS PCT: 7.5 %
Monocyte #: 0.6 x10 3/mm (ref 0.2–1.0)
Neutrophil #: 5.6 10*3/uL (ref 1.4–6.5)
Neutrophil %: 71.8 %
Platelet: 171 10*3/uL (ref 150–440)
RBC: 2.73 10*6/uL — AB (ref 4.40–5.90)
RDW: 13.4 % (ref 11.5–14.5)
WBC: 7.8 10*3/uL (ref 3.8–10.6)

## 2014-08-30 LAB — MAGNESIUM: Magnesium: 2 mg/dL

## 2014-08-31 LAB — HEMOGLOBIN: HGB: 8.4 g/dL — ABNORMAL LOW (ref 13.0–18.0)

## 2014-09-05 NOTE — Consult Note (Signed)
PATIENT NAME:  Theodore Padilla, Theodore Padilla MR#:  [id number] DATE OF BIRTH:  Sep 13, 1921  DATE OF CONSULTATION:  09/12/2013  CONSULTING PHYSICIAN:  Isaias Cowman, MD  PRIMARY CARE PHYSICIAN:  Dr. Gilford Rile.  PRIMARY CARDIOLOGIST:  Dr. Nehemiah Massed.   CHIEF COMPLAINT:  Chest discomfort.   REASON FOR CONSULTATION:  Consultation requested for evaluation of chest pain.   HISTORY OF PRESENT ILLNESS:  The patient is an 81 year old gentleman with known history of coronary artery disease. He has a history of coronary stent in the right coronary artery 08/2002. The patient presents with a history of intermittent chest pain described as tightness and sharpness without radiation. This was associated with exertional dyspnea. The patient is followed by Dr. Nehemiah Massed, but failed to undergo outpatient stress test. He presented to [facility] Room where he ruled out for myocardial infarction by CPK, isoenzymes and troponin. The patient currently is chest pain free.   PAST MEDICAL HISTORY:   1.  Status post coronary stent, right coronary artery 08/2002.  2.  Hypertension.  3.  Hyperlipidemia.  4.  History of DVT.  5.  Gastroesophageal reflux disease.  6.  Osteoporosis.  7.  History of nephrolithiasis.   MEDICATIONS: Aspirin 81 mg daily, lisinopril/hydrochlorothiazide 10/12.5 daily, allopurinol 10 mg daily.   SOCIAL HISTORY:  The patient is a widower. He currently lives alone. He denies tobacco abuse.   FAMILY HISTORY:  No immediate family history of coronary artery disease or myocardial infarction.     REVIEW OF SYSTEMS:   CONSTITUTIONAL:  No fever or chills.  EYES:  No blurry vision.  EARS:  No hearing loss.  RESPIRATORY:  The patient has shortness of breath when he has chest pain.  CARDIOVASCULAR:  The patient has had intermittent chest pain as described above.  GASTROINTESTINAL:  No nausea, vomiting or diarrhea.  GENITOURINARY:  No dysuria or hematuria.  ENDOCRINE:  No polyuria or polydipsia.   MUSCULOSKELETAL:  The patient has some diffuse arthritis.  NEUROLOGICAL:  No focal muscle weakness or numbness.  PSYCHOLOGICAL:  No depression or anxiety.   PHYSICAL EXAMINATION: VITAL SIGNS:  Blood pressure 129/73, pulse 55, respirations 18, temperature 98, pulse ox 97%.  HEENT:  Pupils equal, reactive to light and accommodation.  NECK:  Supple without thyromegaly.  LUNGS:  Clear.  HEART:  Normal JVP. Normal PMI. Regular rate and rhythm. Normal S1, S2. No appreciable gallop, murmur or rub.  ABDOMEN:  Soft and nontender. Pulses were intact bilaterally.  MUSCULOSKELETAL:  Normal muscle tone.  NEUROLOGIC:  The patient was alert and oriented  x 3. Motor and sensory both grossly intact.   IMPRESSION:  An 81 year old gentleman with known coronary artery disease, who presents with chest pain with typical and atypical features. EKG was nondiagnostic. The patient has ruled out for myocardial infarction by CPK, isoenzymes and troponin.   RECOMMENDATIONS: 1.  Agree with overall current therapy.  2.  Would defer full dose anticoagulation.  3.  Proceed with Lexiscan sestamibi study.  4.  Further recommendations pending Lexiscan sestamibi results.    ____________________________ Isaias Cowman, MD ap:dmm D: 09/12/2013 13:01:40 ET T: 09/12/2013 13:09:11 ET JOB#: [id number]  cc: Isaias Cowman, MD, <Dictator> Isaias Cowman MD ELECTRONICALLY SIGNED 09/16/2013 12:47

## 2014-09-05 NOTE — H&P (Signed)
PATIENT NAME:  Theodore Padilla, Theodore Padilla MR#:  [id number] DATE OF BIRTH:  [birth year]  DATE OF ADMISSION:  09/11/2013  PRIMARY CARE PHYSICIAN:  Dr. Arnetha Courser. Walker  PRIMARY CARDIOLOGIST:  Dr. Nehemiah Massed  CHIEF COMPLAINT: On and off chest pain for a few weeks.   HISTORY OF PRESENT ILLNESS: Theodore Padilla is an 81 year old, Caucasian gentleman with history of coronary artery disease, hypertension, hyperlipidemia, history of DVT in the past, now off Coumadin. Comes in with on and off chest pressure, which is sharp, along with some shortness of breath while walking a distance, for the last several months. The patient was last seen by Dr. Nehemiah Massed in February. He was supposed to have outpatient stress test. However, he had several social issues and could not make it for the stress test. He comes in today because he is having some sharp, mid-substernal chest pain, which is nonradiating, without any associated nausea or diaphoresis. Given his history of positive coronary artery disease, he is being admitted for further evaluation of his chest pain.   PAST MEDICAL HISTORY: 1.  CAD.   2.  Hypertension.  3.  Hyperlipidemia.  4.  Status post DVT, off Coumadin.  5.  Gastroesophageal reflux disease.  6.  Chronic anginal pain.  7.  Osteoporosis.  8.  Renal stones and colon polyps.   SURGERIES:   1.  Status post PCI with stent placement in April 2004.  2.  Status post hip replacement.   FAMILY HISTORY: No family history of coronary artery disease or hypertension.   SOCIAL HISTORY: Denies tobacco use. He lives alone, his wife passed away about 3 weeks ago. Denies any alcohol use.   REVIEW OF SYSTEMS:    CONSTITUTIONAL: No fever, fatigue, weakness.  EYES: No blurred or double vision, glaucoma or cataracts.  EARS, NOSE, THROAT: No tinnitus, ear pain, or hearing loss.  RESPIRATORY: No cough, wheeze, hemoptysis or COPD.   CARDIOVASCULAR: Positive for on and off chest pain. No orthopnea. Positive for dyspnea on exertion and  leg edema.  GASTROINTESTINAL: No nausea, vomiting, diarrhea, abdominal pain, or GERD.  GENITOURINARY: No dysuria, hematuria, or frequency.  ENDOCRINE: No polyuria, nocturia, or thyroid problems.  HEMATOLOGY: No anemia or easy bruising.  SKIN: No acne or rash.  MUSCULOSKELETAL: Positive for arthritis.  NEUROLOGIC: No CVA or TIA.   PSYCHIATRIC: No anxiety or depression.  All other systems reviewed and negative.   MEDICATION LIST:  1.  Hydrochlorothiazide/lisinopril 12.5/10 mg p.o. daily.  2.  Aspirin 81 mg daily.  3.  Allopurinol 100 mg p.o. daily.   ALLERGIES: PENICILLIN and TRAMADOL.   DATA:  CT angiography shows no evidence of PE. No acute findings in the thorax. A 1.7 to 2.5 cm macrolobular nodular density present since 10/12/2012. Appears benign, given the  (Dictation Anomaly) size.   Mild fibrotic changes in the lung bases. Dilatation of pulmonary trunk and main pulmonary arteries bilaterally, suggesting pulmonary artery hypertension.   Atherosclerosis, including left main and 3-vessel coronary artery disease.   Interval enlargement and heterogeneous appearance, a nodule of the left lobe of the thyroid gland, which currently measures 4 x 3.3 cm.   B-type natriuretic peptide is 64. CBC within normal limits. D-dimer is 145. Comprehensive metabolic panel within normal limits. EKG shows no acute ST elevation or depression.   ASSESSMENT: An 81 year old Theodore Padilla with history of coronary artery disease, status post PCI and stent placement of RCA in 2004, hypertension, hyperlipidemia, comes in with:   1.  Chest pain on and off. The  patient has history of chronic angina. The patient reports having sharper intensity of chest pain, which is intermittently present, with some mild shortness of breath while walking long distance. First set of cardiac enzymes is negative. The patient is hemodynamically stable. Will admit him on telemetry floor. Cycle cardiac enzymes x 3. Continue his aspirin  and sublingual nitro. Will continue lisinopril/hydrochlorothiazide combination. Will have Cardiology see patient. Schedule a Myoview stress test for morning.   2.  Check lipid profile.   3.  Hypertension. Continue hydrochlorothiazide/lisinopril.   4.  History of lung nodule, which appears stable on CT of the chest in comparison to May 2014.   5.  Incidental finding of nodule in the left lobe of the thyroid gland. Will defer to Dr. Gilford Rile. Consider a thyroid ultrasound. In the meantime, I will check a TSH.   6.  Deep vein thrombosis prophylaxis. Will give subcutaneous heparin.   Further work up according to patient's clinical course. Hospital admission plan was discussed with patient.  Time spent:  50 minutes.    ____________________________ Hart Rochester Posey Pronto, MD sap:mr D: 09/11/2013 19:28:07 ET T: 09/11/2013 20:56:13 ET JOB#: [id number]  cc: Brayln Duque A. Posey Pronto, MD, <Dictator> Conley B. Sarina Ser, MD  Ilda Basset MD ELECTRONICALLY SIGNED 09/12/2013 12:51

## 2014-09-13 NOTE — Consult Note (Signed)
PATIENT NAME:  Theodore Padilla, Theodore Padilla MR#:  [id number] DATE OF BIRTH:  [birth year]  DATE OF CONSULTATION:  08/29/2014  REFERRING PHYSICIAN:   CONSULTING PHYSICIAN:  Lucilla Lame, MD  CONSULTING SERVICE:  Gastroenterology.    REASON FOR CONSULTATION:  Black stools.   HISTORY OF PRESENT ILLNESS:  This patient is a 81 year old gentleman who has a history of having problems urinating and went to his primary care provider who had given him medication for this. The patient states he was given Cipro to be taken twice a day for which he only took one in the evening last night.  At having that first pill, the patient states that he felt dizzy and weak and started to have nausea and vomiting.  Blood pressure was found on admission to be 70/40.  He also noted that he had started to have black, tarry stools last night.  He had another episode of black, tarry stools this morning.  I am now being asked to see the patient for melena.   PAST MEDICAL HISTORY:  Coronary artery disease, hypertension, hyperlipidemia, DVT, chronic anginal pain, osteoporosis, renal stones, colon polyps, and gout.   PAST SURGICAL HISTORY:  Status post bilateral hip replacement.   SOCIAL HISTORY:  No tobacco, alcohol, or drugs.   FAMILY HISTORY:  Noncontributory.   REVIEW OF SYSTEMS:  Shortness of breath.  The rest of the 10-point review of systems was negative except as stated above.   HOME MEDICATIONS:   1.  Hydrochlorothiazide/lisinopril.   2.  Finasteride. 3.  Cipro. 4. Allopurinol.   PHYSICAL EXAMINATION:  GENERAL:  The patient is sitting in bed in no apparent distress, speaking in full sentences.  VITAL SIGNS:  Temperature 98, pulse 76, respirations 20, blood pressure 121/56, pulse oximetry 98%.  HEENT:  Normocephalic, atraumatic.  Extraocular muscles intact.  Pupils equal, round, and reactive to light and accommodation without JVD and without lymphadenopathy.  LUNGS:  Clear to auscultation bilaterally.  HEART:  Regular rate and  rhythm without murmurs, rubs, or gallops.  ABDOMEN:  Soft, nontender, nondistended without hepatosplenomegaly.  EXTREMITIES:  Without cyanosis, clubbing, or edema.  NEUROLOGICAL:  Grossly intact.   ANCILLARY SERVICES:  Labs revealed hemoglobin of 10.9.  White cell count of 10.8, INR 1.1.   ASSESSMENT AND PLAN:  This patient is a 81 year old gentleman who has had melena and was admitted to the hospital.  The patient will be set up for an EGD for tomorrow.  The patient has been explained the plan and agrees with it.    Thank you very much for involving me in the care of this patient.  If you have any questions, please do not hesitate to call.     ____________________________ Lucilla Lame, MD [phone] D: 08/29/2014 14:15:47 ET T: 08/29/2014 14:48:33 ET JOB#: [id number]  cc: Lucilla Lame, MD, <Dictator> Lucilla Lame MD ELECTRONICALLY SIGNED 08/30/2014 9:14

## 2014-09-13 NOTE — H&P (Signed)
PATIENT NAME:  Theodore Padilla, Theodore Padilla MR#:  [id number] DATE OF BIRTH:  21-Apr-1922  DATE OF ADMISSION:  08/29/2014  PRIMARY CARE PHYSICIAN: Jona B. Sarina Ser, MD.  REFERRING PHYSICIAN: Dr. Corky Downs.  CHIEF COMPLAINT: Black stools since last night.   HISTORY OF PRESENT ILLNESS: A 81 year old, Caucasian male with a history of hypertension and CAD, presented to the ED with the above chief complaint. The patient is alert, awake, oriented, in no acute distress. The patient had 1 episode of large black stool last night and then had another episode of black stool this morning. The patient felt dizzy and weak. He was found to have a low blood pressure at 70/40. He was treated with a normal saline bolus. The patient denies any headache, fever, chills. No chest pain, palpitations. The patient has a history of urinary retention and UTI. Was given Cipro yesterday by urologist. The patient also has a history of DVT and was on Coumadin, but off Coumadin for 3 years. The patient has a history of CAD and he is on aspirin. The patient had a colonoscopy 5 years ago.    PAST MEDICAL HISTORY: CAD, hypertension, hyperlipidemia, DVT off Coumadin, Chronic anginal pain, osteoporosis, renal stone, colon polyps and gout.  PAST SURGICAL HISTORY: PCI with stent placement in April of 2004, status post bilateral hip replacement.   SOCIAL HISTORY: No smoking or drinking or illicit drugs.   FAMILY HISTORY: No CAD, hypertension or diabetes.   REVIEW OF SYSTEMS:  CONSTITUTIONAL: The patient denies any fever or chills. No headache, but has dizziness and generalized weakness.  EYES: No double vision or blurred vision.  EARS, NOSE, AND THROAT: No postnasal drip, slurred speech or dysphagia.  CARDIOVASCULAR: No chest pain, palpitation, orthopnea or nocturnal dyspnea. No leg edema.  PULMONARY: No cough, sputum, shortness of breath or hematemesis.  GASTROINTESTINAL: Positive melena. No abdominal pain, nausea, vomiting, diarrhea. No bloody  stools.  GENITOURINARY: No dysuria, hematuria or incontinence, but has urinary retention and UTI.  HEMATOLOGY: No easy bruising or bleeding.  ENDOCRINOLOGY: No polyuria, polydipsia, heat or cold intolerance.  NEUROLOGY: No syncope, loss of consciousness or seizure.  ALLERGIES: PENICILLIN, TRAMADOL.   HOME MEDICATIONS: Hydrochlorothiazide/lisinopril 12.5 mg/10 mg p.o. daily, finasteride  5 mg p.o. daily, Cipro 250 mg p.o. every 12 hours, aspirin 81 mg p.o. daily, allopurinol 100 mg p.o. daily.   PHYSICAL EXAMINATION:  VITAL SIGNS: Blood pressure was 66/46 are now after normal saline bolus the patient's blood pressure increased to 106/55, pulse 74, O2 saturation 93% on oxygen. Temperature is 97.7. GENERAL: The patient is alert, awake, oriented, in no acute distress.  HEENT: Pupils round and equal and reactive to light and accommodation. Dry oral mucosa. Clear oropharynx.  NECK: Supple. No JVD or carotid bruit. No lymphadenopathy. No thyromegaly.   CARDIOVASCULAR: S1, S2. Regular rate and rhythm. No murmurs or gallops.  PULMONARY: Bilateral air entry. No wheezing or rales. No use of accessory muscles to breathe.  ABDOMEN: Soft. No distention or tenderness. No organomegaly. Bowel sounds present.  EXTREMITIES: Trace edema on bilateral lower extremities. No clubbing or cyanosis. No calf tenderness. Bilateral pedal pulses present.  SKIN: No rash or jaundice.  NEUROLOGY: A and O x 3. No focal deficit. Power 5/5. Sensation intact.   LABORATORY DATA: Glucose 149, BUN 91, creatinine 1.86. Electrolytes normal. WBC 10.8, hemoglobin 10.9. Hemoglobin was 14 on 09/11/2013. Platelets 237,000. INR 1.1, PTT 29.2. Urinalysis is pending. EKG shows sinus rhythm with a first-degree AV block at 86 BPM.   IMPRESSIONS:  1. Acute gastrointestinal bleeding.  2. Anemia.  3. Acute renal failure with dehydration.  4. Hypotension, possibly due to gastroinestinal bleeding and acute renal failure.  5. History of  hypertension.  6. Benign prostatic hypertrophy with urinary retention.  7. History of deep venous thrombosis off Coumadin.  8. Gastroesophageal reflux disease.  9. Hyperlipidemia.  10. Urinary tract infection, according to the patient.   PLAN OF CARE:  1. The patient will be admitted to medical floor. We will keep n.p.o. with IV fluid support. We will continue Protonix drip, check the hemoglobin every 6 hours. We will get a gastroinestinal consult for possible endoscopy. In addition, the patient will get 2 units packed red blood cells transfusion.  2. For acute renal failure and dehydration, we will continue normal saline IV, follow up basic metabolic profile.  3. For hypotension. The patient's blood pressure is better after normal saline bolus. We will continue to monitor vital signs closely.  4. Hypertension. I will hold hydrochlorothiazide/lisinopril due to for hypotension and acute renal failure.  5. Hold aspirin due to gastrointestinal bleeding.   I discussed the patient's condition and plan of treatment with the patient. The patient wants full code.   TIME SPENT: About 56 minutes.     ____________________________ Demetrios Loll, MD qc:TT D: 08/29/2014 11:34:43 ET T: 08/29/2014 11:59:14 ET JOB#: [id number]  cc: Demetrios Loll, MD, <Dictator> Demetrios Loll MD ELECTRONICALLY SIGNED 08/29/2014 13:32

## 2014-09-13 NOTE — Consult Note (Signed)
Chief Complaint:  Subjective/Chief Complaint Feels well.  Denies melena, nausea, vomiting or abdominal pain.  No BM in past 24 hrs.   VITAL SIGNS/ANCILLARY NOTES: **Vital Signs.:   18-Apr-16 07:54  Vital Signs Type Routine  Temperature Temperature (F) 97.9  Temperature Source oral  Pulse Pulse 64  Respirations Respirations 18  Systolic BP Systolic BP 017  Diastolic BP (mmHg) Diastolic BP (mmHg) 58  Mean BP 77  Pulse Ox % Pulse Ox % 96  Pulse Ox Activity Level  At rest  Oxygen Delivery Room Air/ 21 %   Brief Assessment:  GEN well developed, well nourished, no acute distress, obese, A/Ox3, friend at bedside   Cardiac Regular   Respiratory normal resp effort   Gastrointestinal Normal   Gastrointestinal details normal Soft  Nontender  Nondistended  Bowel sounds normal  No rebound tenderness  No gaurding   EXTR negative cyanosis/clubbing, negative edema   Additional Physical Exam Skin: pink, warm, dry   Lab Results: Routine Hem:  18-Apr-16 03:27   Hemoglobin (CBC)  8.4 (Result(s) reported on 31 Aug 2014 at 04:18AM.)   Assessment/Plan:  Assessment/Plan:  Assessment Melena: Resolved.  EGD showed gastritis & petechiae without active bleeding. Anemia: Hgb stable   Plan 1) Continue protonix 40mg  daily indefinitely 2) Avoid NSAIDS 3) Monitor for recurrent bleeding Please call if you have any questions or concerns   Electronic Signatures: Andria Meuse (NP)  (Signed 18-Apr-16 09:49)  Authored: Chief Complaint, VITAL SIGNS/ANCILLARY NOTES, Brief Assessment, Lab Results, Assessment/Plan   Last Updated: 18-Apr-16 09:49 by Andria Meuse (NP)

## 2014-09-13 NOTE — Consult Note (Signed)
Brief Consult Note: Diagnosis: Melena that started yesterday after nausea and vomiting after starting cipro.   Patient was seen by consultant.   Consult note dictated.   Comments: Will plan for an EGD for tomorrow.  Electronic Signatures: Lucilla Lame (MD)  (Signed 16-Apr-16 12:36)  Authored: Brief Consult Note   Last Updated: 16-Apr-16 12:36 by Lucilla Lame (MD)

## 2014-09-13 NOTE — Discharge Summary (Signed)
PATIENT NAME:  Theodore Padilla, PONDER MR#:  [id number] DATE OF BIRTH:  Mar 13, 1922  DATE OF ADMISSION:  08/29/2014 DATE OF DISCHARGE:  08/31/2014  PRESENTING COMPLAINT:  Black stool since last night.   DISCHARGE DIAGNOSES:  1. Melena secondary to slow gastrointestinal bleed from gastritis, status post endoscopy.  2. History of benign prostatic hypertrophy with urinary retention, has Foley now placed on 08/30/2014.    3. Urinary tract infection, ongoing treatment as outpatient.  4. Esophagogastroduodenoscopy showed normal esophagus, gastritis, gastric petechiae, normal duodenum.    CONSULTATION:  Gastrointestinal consultation by Dr. Allen Norris.    CODE STATUS:  Full code.   MEDICATIONS:  1. Allopurinol 100 mg p.o. daily.  2. Hydrochlorothiazide/lisinopril 12.5/10 one p.o. daily.  3. Finasteride 5 mg p.o. daily.  4. Ciprofloxacin 250 p.o. daily b.i.d.  5. Aspirin 81 mg daily, start after 1 week.  6. Protonix 40 mg b.i.d. for 3 weeks, then take 1 tablet daily.   SERVICES:  Home health PT, RN.   FOLLOWUP:   1. Follow up with Dr. Bjorn Loser, [facility].  [place] office will call for appointment.  2. Follow up with Dr. Arnetha Courser. Walker in 1-2 weeks.  3. Foley care per protocol.  4. Home health PT and RN.   BRIEF SUMMARY OF HOSPITAL COURSE:  [name] is a very pleasant, Caucasian gentleman with past medical history of BPH, comes in with:  1. Melenic stools.  He was admitted, started on some IV fluids, seen by GI, and he was also started on Protonix drip which was changed to p.o. PPI at discharge.  Dr. Allen Norris saw the patient and recommended EGD, which showed results as above.  Hemoglobin was 8.4; did not require any blood transfusion.  2. Acute renal failure with dehydration, resolved with IV fluids.  3. Hypotension, possibly due to dehydration, resolved.  4. BPH with urinary retention and UTI.  We had to place a Foley for urinary retention.  Discussed with his urology doctor, Dr. Matilde Sprang at  [facility], and recommended to go home with Foley and follow up in 1-2 days.  Added Flomax.  Continued outpatient Cipro for UTI.  5. DVT prophylaxis.  Subcutaneous heparin t.i.d.   Hospital stay otherwise remained stable.   CODE STATUS:  The patient remained a full code.   TIME SPENT:  Forty minutes.    ____________________________ Hart Rochester Posey Pronto, MD sap:kc D: 09/02/2014 11:34:46 ET T: 09/02/2014 14:11:36 ET JOB#: [id number]  cc: Jamen Loiseau A. Posey Pronto, MD, <Dictator> Bjorn Loser, [facility], Cassell Clement, MD J.B. Suanne Marker MD ELECTRONICALLY SIGNED 09/11/2014 14:44

## 2014-09-25 ENCOUNTER — Other Ambulatory Visit (INDEPENDENT_AMBULATORY_CARE_PROVIDER_SITE_OTHER): Payer: Self-pay

## 2014-09-25 ENCOUNTER — Other Ambulatory Visit: Payer: Self-pay | Admitting: Surgery

## 2014-09-25 DIAGNOSIS — E782 Mixed hyperlipidemia: Secondary | ICD-10-CM | POA: Insufficient documentation

## 2014-09-25 DIAGNOSIS — I10 Essential (primary) hypertension: Secondary | ICD-10-CM | POA: Insufficient documentation

## 2014-09-25 DIAGNOSIS — Q441 Other congenital malformations of gallbladder: Secondary | ICD-10-CM

## 2014-09-25 DIAGNOSIS — Z8719 Personal history of other diseases of the digestive system: Secondary | ICD-10-CM

## 2014-09-30 DIAGNOSIS — I071 Rheumatic tricuspid insufficiency: Secondary | ICD-10-CM | POA: Insufficient documentation

## 2014-09-30 DIAGNOSIS — I340 Nonrheumatic mitral (valve) insufficiency: Secondary | ICD-10-CM | POA: Insufficient documentation

## 2014-10-01 ENCOUNTER — Other Ambulatory Visit: Payer: Self-pay

## 2014-10-14 ENCOUNTER — Other Ambulatory Visit: Payer: Self-pay | Admitting: Urology

## 2014-10-14 NOTE — Telephone Encounter (Signed)
Please notify patient refill is sent.    Meds ordered this encounter  Medications  . finasteride (PROSCAR) 5 MG tablet    Sig: TAKE ONE TABLET BY MOUTH ONCE DAILY    Dispense:  90 tablet    Refill:  3

## 2014-10-14 NOTE — Telephone Encounter (Deleted)
I'm just trying to see if this works Conservation officer, historic buildings so don't get all snotty if it doesn't - OK?  Just let me know.  Thanks, Richardson Landry

## 2014-10-20 NOTE — Telephone Encounter (Signed)
A letter has been mailed to the pt with his Rx info. and a request to call back with contact information.

## 2015-02-25 ENCOUNTER — Encounter: Payer: Self-pay | Admitting: *Deleted

## 2015-03-10 ENCOUNTER — Encounter: Payer: Self-pay | Admitting: Urology

## 2015-03-10 ENCOUNTER — Ambulatory Visit (INDEPENDENT_AMBULATORY_CARE_PROVIDER_SITE_OTHER): Payer: Medicare Other | Admitting: Urology

## 2015-03-10 VITALS — BP 183/80 | HR 76 | Ht 66.0 in | Wt 230.0 lb

## 2015-03-10 DIAGNOSIS — K921 Melena: Secondary | ICD-10-CM

## 2015-03-10 DIAGNOSIS — N401 Enlarged prostate with lower urinary tract symptoms: Secondary | ICD-10-CM | POA: Diagnosis not present

## 2015-03-10 DIAGNOSIS — N138 Other obstructive and reflux uropathy: Secondary | ICD-10-CM | POA: Insufficient documentation

## 2015-03-10 DIAGNOSIS — R3129 Other microscopic hematuria: Secondary | ICD-10-CM | POA: Insufficient documentation

## 2015-03-10 DIAGNOSIS — N2889 Other specified disorders of kidney and ureter: Secondary | ICD-10-CM

## 2015-03-10 LAB — URINALYSIS, COMPLETE
Bilirubin, UA: NEGATIVE
Glucose, UA: NEGATIVE
Ketones, UA: NEGATIVE
Leukocytes, UA: NEGATIVE
Nitrite, UA: NEGATIVE
PH UA: 6 (ref 5.0–7.5)
Specific Gravity, UA: 1.025 (ref 1.005–1.030)
UUROB: 0.2 mg/dL (ref 0.2–1.0)

## 2015-03-10 LAB — MICROSCOPIC EXAMINATION
RBC, UA: >30 /hpf — AB (ref 0–?)
RENAL EPITHEL UA: NONE SEEN /HPF
WBC UA: NONE SEEN /HPF (ref 0–?)

## 2015-03-10 NOTE — Progress Notes (Signed)
03/10/2015 2:47 PM   Theodore Padilla 07/12/1921 [id number]  Referring provider: No referring provider defined for this encounter.  Chief Complaint  Patient presents with  . Nephrolithiasis    one year recheck    HPI: Patient is a 81 year old white male who was hospitalized for black stools and was found to have a left renal mass on a CT performed during his admission in April 2016.    He was seen at [facility] with Dr. Tresa Moore who recommended a biopsy of the lesion, but he did not follow through with that plan.   He is not convinced that he has a possible kidney cancer. He believes he has stones.  I reviewed the images with the patient and tried to explain to him that the lesion in question was not a stone but a mass suspicious for cancer.  He was still not convinced and did not want to undergo a biopsy.  He also had a Foley placed in the hospital and is currently on finasteride and tamsulosin. He states he is voiding well. His I PSS score is 1/2.  He is on any gross hematuria, but he had greater than 30 RBC's on his UA today.  He is not experiencing dysuria or suprapubic pain. He also denies fevers, chills, nausea and vomiting.  He was very forgetful during the interview. He was hyper focused on kidney stones and his constipation.  I had a difficult time redirecting him from these topics.      IPSS      03/10/15 1100       International Prostate Symptom Score   How often have you had the sensation of not emptying your bladder? Not at All     How often have you had to urinate less than every two hours? Not at All     How often have you found you stopped and started again several times when you urinated? Not at All     How often have you found it difficult to postpone urination? Not at All     How often have you had a weak urinary stream? Not at All     How often have you had to strain to start urination? Less than 1 in 5 times     How many times did you typically get up at  night to urinate? None     Total IPSS Score 1     Quality of Life due to urinary symptoms   If you were to spend the rest of your life with your urinary condition just the way it is now how would you feel about that? Mostly Satisfied        Score:  1-7 Mild 8-19 Moderate 20-35 Severe   PMH: Past Medical History  Diagnosis Date  . Hypertension   . Coronary artery disease     with stenting x 1  . Shortness of breath     occasional  . History of kidney stones   . Arthritis   . BPH (benign prostatic hyperplasia)   . Urinary hesitancy   . HLD (hyperlipidemia)   . Gout   . Sleep apnea   . Chronic constipation   . Urinary retention     Surgical History: Past Surgical History  Procedure Laterality Date  . Total hip arthroplasty      bilat  . Appendectomy    . Lithotripsy    . Kidney stone surgery    . Hernia repair    .  Foreign body removal      L knee  . Tonsillectomy    . Cardiac catheterization  2000    with stent  . Lumbar laminectomy/decompression microdiscectomy  01/22/2012    Procedure: LUMBAR LAMINECTOMY/DECOMPRESSION MICRODISCECTOMY 1 LEVEL;  Surgeon: Ophelia Charter, MD;  Location: [place] [facility];  Service: Neurosurgery;  Laterality: N/A;  Lumbar two and lumbar three laminectomies    Home Medications:    Medication List       This list is accurate as of: 03/10/15  2:47 PM.  Always use your most recent med list.               allopurinol 100 MG tablet  Commonly known as:  ZYLOPRIM  Take 100 mg by mouth daily.     aspirin 81 MG tablet  Take 81 mg by mouth daily.     docusate sodium 100 MG capsule  Commonly known as:  COLACE  Take 100 mg by mouth daily.     finasteride 5 MG tablet  Commonly known as:  PROSCAR  TAKE ONE TABLET BY MOUTH ONCE DAILY     furosemide 20 MG tablet  Commonly known as:  LASIX  Take by mouth.     lisinopril-hydrochlorothiazide 10-12.5 MG tablet  Commonly known as:  PRINZIDE,ZESTORETIC  Take 1 tablet by mouth daily.       pantoprazole 40 MG tablet  Commonly known as:  PROTONIX  Take by mouth.     tamsulosin 0.4 MG Caps capsule  Commonly known as:  FLOMAX  Take 1 capsule (0.4 mg total) by mouth at bedtime.     triamcinolone cream 0.1 %  Commonly known as:  KENALOG  Use over affected areas sparingly as needed for the face        Allergies:  Allergies  Allergen Reactions  . Penicillins Anaphylaxis  . Ciprofloxacin Other (See Comments)  . Melatonin Nausea And Vomiting  . Tramadol Nausea And Vomiting  . Rofecoxib Rash    Other Reaction: Not Assessed    Family History: Family History  Problem Relation Age of Onset  . Kidney disease Neg Hx   . Prostate cancer Neg Hx   . Kidney Stones      Social History:  reports that he has quit smoking. He does not have any smokeless tobacco history on file. He reports that he drinks alcohol. He reports that he does not use illicit drugs.  ROS: UROLOGY Frequent Urination?: No Hard to postpone urination?: No Burning/pain with urination?: No Get up at night to urinate?: No Leakage of urine?: No Urine stream starts and stops?: No Trouble starting stream?: No Do you have to strain to urinate?: No Blood in urine?: No Urinary tract infection?: No Sexually transmitted disease?: No Injury to kidneys or bladder?: No Painful intercourse?: No Weak stream?: No Erection problems?: No Penile pain?: No  Gastrointestinal Nausea?: No Vomiting?: No Indigestion/heartburn?: No Diarrhea?: No Constipation?: No  Constitutional Fever: No Night sweats?: No Weight loss?: No Fatigue?: No  Skin Skin rash/lesions?: No Itching?: No  Eyes Blurred vision?: No Double vision?: No  Ears/Nose/Throat Sore throat?: No Sinus problems?: No  Hematologic/Lymphatic Swollen glands?: Yes Easy bruising?: No  Cardiovascular Leg swelling?: No Chest pain?: No  Respiratory Cough?: No Shortness of breath?: No  Endocrine Excessive thirst?:  No  Musculoskeletal Back pain?: Yes Joint pain?: No  Neurological Headaches?: No Dizziness?: No  Psychologic Depression?: No Anxiety?: No  Physical Exam: BP 183/80 mmHg  Pulse 76  Ht 5\' 6"  (  1.676 m)  Wt 230 lb (104.327 kg)  BMI 37.14 kg/m2  Constitutional:  Alert and oriented, No acute distress. HEENT: [place] AT, moist mucus membranes.  Trachea midline, no masses. Cardiovascular: No clubbing, cyanosis, or edema. Respiratory: Normal respiratory effort, no increased work of breathing. GI: Abdomen is soft, non tender, non distended, no abdominal masses GU: No CVA tenderness.  Skin: No rashes, bruises or suspicious lesions. Lymph: No cervical or inguinal adenopathy. Neurologic: Grossly intact, no focal deficits, moving all 4 extremities. Psychiatric: Normal mood and affect.  Laboratory Data: Lab Results  Component Value Date   WBC 7.8 08/30/2014   HGB 8.4* 08/31/2014   HCT 26.4* 08/30/2014   MCV 97 08/30/2014   PLT 171 08/30/2014    Lab Results  Component Value Date   CREATININE 1.15 08/30/2014    Urinalysis: Results for orders placed or performed in visit on 03/10/15  Microscopic Examination  Result Value Ref Range   WBC, UA None seen 0 -  5 /hpf   RBC, UA >30 (A) 0 -  2 /hpf   Epithelial Cells (non renal) 0-10 0 - 10 /hpf   Renal Epithel, UA None seen None seen /hpf   Mucus, UA Present (A) Not Estab.   Bacteria, UA Few (A) None seen/Few  Urinalysis, Complete  Result Value Ref Range   Specific Gravity, UA 1.025 1.005 - 1.030   pH, UA 6.0 5.0 - 7.5   Color, UA Yellow Yellow   Appearance Ur Clear Clear   Leukocytes, UA Negative Negative   Protein, UA 2+ (A) Negative/Trace   Glucose, UA Negative Negative   Ketones, UA Negative Negative   RBC, UA 3+ (A) Negative   Bilirubin, UA Negative Negative   Urobilinogen, Ur 0.2 0.2 - 1.0 mg/dL   Nitrite, UA Negative Negative   Microscopic Examination See below:     Pertinent Imaging: I cannot get the report to  transfer to the note.  The CT report and images are available on [facility].   Assessment & Plan:    1. BPH (benign prostatic hyperplasia) with LUTS:    Patient's IPSS score 1/2.  He is voiding well at this time.  He denies any gross hematuria, suprapubic pain or dysuria.     2. Left renal mass:   Patient with a 1 cm renal mass in the anterior portion of the interpolar region of the left kidney.  It is suspicious for renal cell carcinoma.  He is unwilling to undergo a biopsy of this lesion at this time.  He did agree to active surveillance and will undergo a CT scan of this area in 6 months.  3. Microscopic hematuria:   Patient had greater than 30 RBC's per high-power field on today's UA. He has not had a cystoscopic examination. We will schedule a cystoscopy at this time.  - Urinalysis, Complete  Greater than 50% was spent in counseling & coordination of care with the patient.  Return for cystoscopy.  Zara Council, [facility] [address], [place], [place] [address] [phone]

## 2015-03-29 ENCOUNTER — Other Ambulatory Visit: Payer: Medicare Other

## 2016-02-16 ENCOUNTER — Encounter: Payer: Self-pay | Admitting: Physical Therapy

## 2016-02-16 ENCOUNTER — Ambulatory Visit: Payer: Medicare Other | Attending: Gastroenterology | Admitting: Physical Therapy

## 2016-02-16 DIAGNOSIS — R278 Other lack of coordination: Secondary | ICD-10-CM

## 2016-02-16 DIAGNOSIS — G8929 Other chronic pain: Secondary | ICD-10-CM | POA: Diagnosis present

## 2016-02-16 DIAGNOSIS — M6281 Muscle weakness (generalized): Secondary | ICD-10-CM | POA: Insufficient documentation

## 2016-02-16 DIAGNOSIS — M545 Low back pain: Secondary | ICD-10-CM | POA: Insufficient documentation

## 2016-02-16 DIAGNOSIS — R2689 Other abnormalities of gait and mobility: Secondary | ICD-10-CM | POA: Insufficient documentation

## 2016-02-16 NOTE — Patient Instructions (Addendum)
Anyone can perform this nurturing and relaxing procedure. It aids digestion, assists in loosening faecal compaction, eases constipation, relieves colic, eases tension and helps regulate breathing.  To help bowels move, try the ' I love you' to massage the large intestine (colon).              Diagram A: The Gut                                                                  Diagram B: The I Love You Technique The 'I Love You' Belly Massage Technique 1.      Apply one tablespoon of preferred massage oil on the belly. Place your flat hand over your navel and shake the abdomen gently.   Popular oils include the following: Olive oil Coconut oil  2.      Now we are going to work on the large intestine with the 'I love you' technique and the aim is to help push faecal matter along the colon to break up the compaction or blockage.  Start medium clockwise circles from your right hip bone using the flat ends of your fingers. Gently circle and gradually move upwards towards the first rib. This pattern travels along the ascending colon (as shown in diagram A coloured purple) and resembles the 'I' (as shown in diagram B). Repeat 3 - 5 times.  In the morning/ evening and before/ after meals      Source of information:  (modified for pt)  [email or web address]  _________________________  Breathing practice to decrease straining  Inhale expan ribcage,  Exhale, soften belly, ribcage lowers __________________________  Continue with drinking 4 bottles of water as you have been practicing

## 2016-02-17 NOTE — Therapy (Signed)
[place] MAIN [facility] SERVICES [address] [place], [place], [address] Phone: [phone]   Fax:  [phone]  Physical Therapy Evaluation  Patient Details  Name: Theodore Padilla MRN: [id number] Date of Birth: 22-Dec-1921 Referring Provider: Josephina Gip, PA  Encounter Date: 02/16/2016      PT End of Session - 02/17/16 2114    Visit Number 1   Number of Visits 12   Date for PT Re-Evaluation 05-10-2016   Authorization Type g codes   PT Start Time 0900   PT Stop Time 1010   PT Time Calculation (min) 70 min   Activity Tolerance Patient tolerated treatment well;No increased pain   Behavior During Therapy WFL for tasks assessed/performed      Past Medical History:  Diagnosis Date  . Arthritis   . BPH (benign prostatic hyperplasia)   . Chronic constipation   . Coronary artery disease    with stenting x 1  . Gout   . History of kidney stones   . HLD (hyperlipidemia)   . Hypertension   . Shortness of breath    occasional  . Sleep apnea   . Urinary hesitancy   . Urinary retention     Past Surgical History:  Procedure Laterality Date  . APPENDECTOMY    . CARDIAC CATHETERIZATION  2000   with stent  . FOREIGN BODY REMOVAL     L knee  . HERNIA REPAIR     umbilicus (below)   . KIDNEY STONE SURGERY    . LITHOTRIPSY    . LUMBAR LAMINECTOMY/DECOMPRESSION MICRODISCECTOMY  01/22/2012   Procedure: LUMBAR LAMINECTOMY/DECOMPRESSION MICRODISCECTOMY 1 LEVEL;  Surgeon: Ophelia Charter, MD;  Location: [place] [facility];  Service: Neurosurgery;  Laterality: N/A;  Lumbar two and lumbar three laminectomies  . TONSILLECTOMY    . TOTAL HIP ARTHROPLASTY     bilat    There were no vitals filed for this visit.       Subjective Assessment - 02/17/16 2109    Subjective 1) constipation: Pt reported he has had constipation for 4-5 years when he started to take pain killers for his back and hips. Pt no longer takes pain killers. Pt takes Milk of Mg,  Ex-lax every 4 days  for elimination of bowel movement if he is constipation.  Bristol Type type 1, 4.  Pt states he does not go to community events due to constipation. Pt lives by him self, wife passed last year.   2) CLBP: despite low back surgeries, he has pain and it limits him from walking. Pt uses scooter out of the house. Pt leans on furniture in the house when walking int he house.         Pertinent History multiple surgeries over abdomen, lumbar spine, B hips             [facility] PT Assessment - 02/17/16 2109      Assessment   Medical Diagnosis constipation   Referring Provider Josephina Gip, PA     Precautions   Precautions None     Restrictions   Weight Bearing Restrictions No     Balance Screen   Has the patient fallen in the past 6 months --  near falls   Has the patient had a decrease in activity level because of a fear of falling?  Yes  due to CLBP, LOB    Is the patient reluctant to leave their home because of a fear of falling?  Yes  due  to constipation     [place] residence  lives alone     Prior Function   Level of Independence Independent     Observation/Other Assessments   Other Surveys  --  COREFO     Coordination   Gross Motor Movements are Fluid and Coordinated --  limited diaphragmatic excursion     Palpation   Palpation comment serverely restricted scar R LQ abdomen, posterior low back   decreased softness over R LQ abdomen     Bed Mobility   Bed Mobility --  electric scooter to bed with no LOB  w/ stand   Supine to Sit 7: Independent  requires fowlers position of bed                   [facility] Adult PT Treatment/Exercise - 02/17/16 2109      Therapeutic Activites    Therapeutic Activities --  see pt instructions     Manual Therapy   Manual therapy comments abdominal lymph massage --> light scar release over LQ                  PT Education - 02/17/16 2114    Education  provided Yes   Education Details POC, anatomy/ physiology, goals HEP   Person(s) Educated Patient   Methods Explanation;Demonstration;Tactile cues;Verbal cues;Handout   Comprehension Returned demonstration;Verbalized understanding;Verbal cues required;Tactile cues required             PT Long Term Goals - 02/17/16 2130      PT LONG TERM GOAL #1   Title Pt will report improved stool consistency Bristol Stool Type 4 for 100% of the time in order to improve bowel function   Time 12   Period Weeks   Status New     PT LONG TERM GOAL #2   Title Pt will report decreased dependence on laxatives from every 4 days to once a week in order to show improved bowel function and to increase community particiaption   Time 12   Period Weeks   Status New     PT LONG TERM GOAL #3   Title Pt will decrease his score on COREFO from 35% to < 30% in order to demo improved bowel function.   Time 12   Period Weeks   Status New     PT LONG TERM GOAL #4   Title Pt will demo decreased scar restrictions over abdomen and lumbar spine, increased diaphragmatic excursion/ coordination of deep core mm across 2 visits in order to promote motility   Time 12   Period Weeks   Status New               Plan - 02/17/16 2115    Clinical Impression Statement Pt is a 82 yo male who c/o constipation and CLBP which limit his ability to perform ADLs and to participate in community activities. Pt's clinical presentations include adipose over abdomen, poor mobility of spine, diaphragm, scars, and poor coordination/ strength of deep core mm.  Pt's deficits are impacted by obesity, age, multiple surgeries that occurred at his spine and abdomen, generalized weakness,, poor toileting mechanics, and limited physical activity.  His limited mobility/motility is influenced by his LBP and previous intake of pain killers that caused constipation.   Following Tx today, pt demo'd increased diaphragm with co-activation of deep  abdominal mm. Pt demo'd IND with gentle self abdominal /colon massage and less straining with simulated bowel movement mechanics.  After pt completes [facility] PT for constipation/ LBP, pt would benefit from home health PT to minimize pt's near falls in his home and to  promote increased overall functional mobility.       Rehab Potential Good   Clinical Impairments Affecting Rehab Potential biopsychosocial factors: wife passed, lives alone   PT Frequency 1x / week   PT Duration 12 weeks   PT Treatment/Interventions ADLs/Self Care Home Management;Biofeedback;Gait training;Stair training;Functional mobility training;Moist Heat;Therapeutic activities;Therapeutic exercise;Balance training;Scar mobilization;Manual lymph drainage;Patient/family education;Manual techniques;Neuromuscular re-education;Passive range of motion;Taping      Patient will benefit from skilled therapeutic intervention in order to improve the following deficits and impairments:  Abnormal gait, Increased fascial restricitons, Improper body mechanics, Pain, Decreased coordination, Decreased mobility, Decreased scar mobility, Increased muscle spasms, Postural dysfunction, Decreased range of motion, Decreased activity tolerance, Decreased balance, Decreased safety awareness, Difficulty walking, Impaired flexibility  Visit Diagnosis: Other abnormalities of gait and mobility  Other lack of coordination  Muscle weakness (generalized)  Chronic low back pain, unspecified back pain laterality, with sciatica presence unspecified      G-Codes - 2016/02/27 [id number]    Functional Assessment Tool Used COREFO score, relies on laxative every 4 days   Functional Limitation Self care   Self Care Current Status [id number]) At least 20 percent but less than 40 percent impaired, limited or restricted   Self Care Goal Status [id number]) At least 1 percent but less than 20 percent impaired, limited or restricted       Problem List Patient Active  Problem List   Diagnosis Date Noted  . Microscopic hematuria 03/10/2015  . Left renal mass 03/10/2015  . BPH with obstruction/lower urinary tract symptoms 03/10/2015    Jerl Mina ,PT, DPT, E-RYT 02/17/2016, 9:37 PM  [place] MAIN [facility] SERVICES [address] [place], [place], [address] Phone: [phone]   Fax:  [phone]  Name: Theodore Padilla MRN: [id number] Date of Birth: 1921-08-03

## 2016-02-21 ENCOUNTER — Ambulatory Visit: Payer: Medicare Other | Admitting: Physical Therapy

## 2016-02-21 DIAGNOSIS — R2689 Other abnormalities of gait and mobility: Secondary | ICD-10-CM

## 2016-02-21 DIAGNOSIS — M545 Low back pain: Secondary | ICD-10-CM

## 2016-02-21 DIAGNOSIS — G8929 Other chronic pain: Secondary | ICD-10-CM

## 2016-02-21 DIAGNOSIS — R278 Other lack of coordination: Secondary | ICD-10-CM

## 2016-02-21 DIAGNOSIS — M6281 Muscle weakness (generalized): Secondary | ICD-10-CM

## 2016-02-21 NOTE — Patient Instructions (Signed)
Diaphragmatic breathing  Strengthening/ flexibility routine:  10 reps each side/day  Shoulder raises before the point of pain Lifting R leg before the point of pain  Sliding L leg out and in before the point of pain  _________  Increasing fiber into diet gradually: Attend lunches at [facility] so you do not have to cook and to ensure you get more fiber  Handout provided about details.

## 2016-02-21 NOTE — Therapy (Signed)
[place] MAIN [facility] SERVICES [address] [place], [place], [address] Phone: [phone]   Fax:  [phone]  Physical Therapy Treatment  Patient Details  Name: Theodore Padilla MRN: [id number] Date of Birth: 1922/01/02 Referring Provider: Josephina Gip, PA  Encounter Date: 02/21/2016      PT End of Session - 02/21/16 1311    Visit Number 2   Number of Visits 12   Date for PT Re-Evaluation 05/12/2016   Authorization Type g codes   PT Start Time 1000   PT Stop Time 1100   PT Time Calculation (min) 60 min   Activity Tolerance Patient tolerated treatment well;No increased pain   Behavior During Therapy WFL for tasks assessed/performed      Past Medical History:  Diagnosis Date  . Arthritis   . BPH (benign prostatic hyperplasia)   . Chronic constipation   . Coronary artery disease    with stenting x 1  . Gout   . History of kidney stones   . HLD (hyperlipidemia)   . Hypertension   . Shortness of breath    occasional  . Sleep apnea   . Urinary hesitancy   . Urinary retention     Past Surgical History:  Procedure Laterality Date  . APPENDECTOMY    . CARDIAC CATHETERIZATION  2000   with stent  . FOREIGN BODY REMOVAL     L knee  . HERNIA REPAIR     umbilicus (below)   . KIDNEY STONE SURGERY    . LITHOTRIPSY    . LUMBAR LAMINECTOMY/DECOMPRESSION MICRODISCECTOMY  01/22/2012   Procedure: LUMBAR LAMINECTOMY/DECOMPRESSION MICRODISCECTOMY 1 LEVEL;  Surgeon: Ophelia Charter, MD;  Location: [place] [facility];  Service: Neurosurgery;  Laterality: N/A;  Lumbar two and lumbar three laminectomies  . TONSILLECTOMY    . TOTAL HIP ARTHROPLASTY     bilat    There were no vitals filed for this visit.      Subjective Assessment - 02/21/16 1012    Subjective Pt had a bowel movement this morning, which was the first one he had in 4 days. Pt states the bowel movement was a "long benedikt" with a hand gesture that measured 8-9". Pt states he is trying  to drink more water.  pt does not eat alot of fiber. Pt does not cook because it hurts his back to stand and cook. He eats out once a week; at [employer] and does order green beans.    Pertinent History multiple surgeries over abdomen, lumbar spine, B hips             [facility] PT Assessment - 02/21/16 1301      Coordination   Gross Motor Movements are Fluid and Coordinated --  improved diaphragmatic excursion     AROM   Overall AROM Comments UE abduction ~90 deg before pain, RLE limited hip and knee flexion due to p!, RLE abduction ~15 deg replaced R LE movement in HEP. LLE hip flexion/ knee ext ~ 30 deg before p!        Palpation   Palpation comment decreased horizional scar adhesions in R LQ of abdomen. Increased scar restrctions along longitudinal scar in R LQ and lumbar                      [facility] Adult PT Treatment/Exercise - 02/21/16 1306      Exercises   Exercises --  semi fowler position:   Other Exercises  RLE hip abd ~10deg/ LE hip flex/knee ext  10reps , UE shoulder abd ~90 deg B 10 reps each     Manual Therapy   Manual therapy comments abdominal lymph massage --> light scar release over LQ                  PT Education - 02/21/16 1311    Education provided Yes   Education Details HEP   Person(s) Educated Patient   Methods Explanation;Demonstration;Tactile cues;Verbal cues   Comprehension Verbalized understanding;Returned demonstration             PT Long Term Goals - 02/17/16 2130      PT LONG TERM GOAL #1   Title Pt will report improved stool consistency Bristol Stool Type 4 for 100% of the time in order to improve bowel function   Time 12   Period Weeks   Status New     PT LONG TERM GOAL #2   Title Pt will report decreased dependence on laxatives from every 4 days to once a week in order to show improved bowel function and to increase community particiaption   Time 12   Period Weeks   Status New     PT LONG TERM  GOAL #3   Title Pt will decrease his score on COREFO from 35% to < 30% in order to demo improved bowel function.   Time 12   Period Weeks   Status New     PT LONG TERM GOAL #4   Title Pt will demo decreased scar restrictions over abdomen and lumbar spine, increased diaphragmatic excursion/ coordination of deep core mm across 2 visits in order to promote motility   Time 12   Period Weeks   Status New               Plan - 02/21/16 1312    Clinical Impression Statement Pt reported having a bowel movement 4 days following last session which is his typical bowel frequency with laxatives. Pt did report the bowel movement was large which occurs sometimes but not frequent. Pt continued to benefit from manual Tx to release scar restrictions over R LQ of abdomen and increased abdominal movement with coordination breathing. Initiated graded  movement to promote increased physical actitivty and pt was educated on pain science and moving before the point of pain.  Pt was also educated on low cost meals at the [employer] as a method to ensure fiber because pt reports he does not cook due to pain with standing. Pt reported interest to this option. Pt will continue to benefit skilled PT.     Rehab Potential Good   Clinical Impairments Affecting Rehab Potential biopsychosocial factors: wife passed, lives alone   PT Frequency 1x / week   PT Duration 12 weeks   PT Treatment/Interventions ADLs/Self Care Home Management;Biofeedback;Gait training;Stair training;Functional mobility training;Moist Heat;Therapeutic activities;Therapeutic exercise;Balance training;Scar mobilization;Manual lymph drainage;Patient/family education;Manual techniques;Neuromuscular re-education;Passive range of motion;Taping      Patient will benefit from skilled therapeutic intervention in order to improve the following deficits and impairments:  Abnormal gait, Increased fascial restricitons, Improper body mechanics, Pain,  Decreased coordination, Decreased mobility, Decreased scar mobility, Increased muscle spasms, Postural dysfunction, Decreased range of motion, Decreased activity tolerance, Decreased balance, Decreased safety awareness, Difficulty walking, Impaired flexibility  Visit Diagnosis: Other lack of coordination  Other abnormalities of gait and mobility  Chronic low back pain, unspecified back pain laterality, with sciatica presence unspecified  Muscle weakness (generalized)  Problem List Patient Active Problem List   Diagnosis Date Noted  . Microscopic hematuria 03/10/2015  . Left renal mass 03/10/2015  . BPH with obstruction/lower urinary tract symptoms 03/10/2015    Jerl Mina ,PT, DPT, E-RYT  02/21/2016, 1:18 PM  [place] MAIN [facility] SERVICES [address] [place], [place], [address] Phone: [phone]   Fax:  [phone]  Name: Theodore Padilla MRN: [id number] Date of Birth: 1921/09/10

## 2016-02-23 ENCOUNTER — Ambulatory Visit: Payer: Medicare Other | Admitting: Physical Therapy

## 2016-02-23 DIAGNOSIS — E538 Deficiency of other specified B group vitamins: Secondary | ICD-10-CM | POA: Insufficient documentation

## 2016-03-01 ENCOUNTER — Ambulatory Visit: Payer: Medicare Other | Admitting: Physical Therapy

## 2016-03-01 DIAGNOSIS — M545 Low back pain: Secondary | ICD-10-CM

## 2016-03-01 DIAGNOSIS — M6281 Muscle weakness (generalized): Secondary | ICD-10-CM

## 2016-03-01 DIAGNOSIS — R2689 Other abnormalities of gait and mobility: Secondary | ICD-10-CM | POA: Diagnosis not present

## 2016-03-01 DIAGNOSIS — R278 Other lack of coordination: Secondary | ICD-10-CM

## 2016-03-01 DIAGNOSIS — G8929 Other chronic pain: Secondary | ICD-10-CM

## 2016-03-02 NOTE — Therapy (Signed)
[place] MAIN [facility] SERVICES [address] [place], [place], [address] Phone: [phone]   Fax:  [phone]  Physical Therapy Treatment  Patient Details  Name: Theodore Padilla MRN: [id number] Date of Birth: 10-08-1921 Referring Provider: Josephina Gip, PA  Encounter Date: 03/01/2016      PT End of Session - 03/02/16 1010    Visit Number 3   Number of Visits 12   Date for PT Re-Evaluation 05-12-2016   Authorization Type g codes   PT Start Time 0910   PT Stop Time 1000   PT Time Calculation (min) 50 min   Activity Tolerance Patient tolerated treatment well;No increased pain   Behavior During Therapy WFL for tasks assessed/performed      Past Medical History:  Diagnosis Date  . Arthritis   . BPH (benign prostatic hyperplasia)   . Chronic constipation   . Coronary artery disease    with stenting x 1  . Gout   . History of kidney stones   . HLD (hyperlipidemia)   . Hypertension   . Shortness of breath    occasional  . Sleep apnea   . Urinary hesitancy   . Urinary retention     Past Surgical History:  Procedure Laterality Date  . APPENDECTOMY    . CARDIAC CATHETERIZATION  August 16, 1998   with stent  . FOREIGN BODY REMOVAL     L knee  . HERNIA REPAIR     umbilicus (below)   . KIDNEY STONE SURGERY    . LITHOTRIPSY    . LUMBAR LAMINECTOMY/DECOMPRESSION MICRODISCECTOMY  01/22/2012   Procedure: LUMBAR LAMINECTOMY/DECOMPRESSION MICRODISCECTOMY 1 LEVEL;  Surgeon: Ophelia Charter, MD;  Location: [place] [facility];  Service: Neurosurgery;  Laterality: N/A;  Lumbar two and lumbar three laminectomies  . TONSILLECTOMY    . TOTAL HIP ARTHROPLASTY     bilat    There were no vitals filed for this visit.      Subjective Assessment - 03/02/16 1008    Subjective Pt had 2 bowel movements (occurring every other day) across the past week. Pt had to take his laxative once this past week and the stools were long in size. Pt states he has had lot more  gas than usual . Pt reported he has not been sleeping well the past few nights despite taking sleeping medication.             [facility] PT Assessment - 03/02/16 1007      Coordination   Gross Motor Movements are Fluid and Coordinated --  significantly improved deep core coordination     AROM   Overall AROM Comments less pain with UE/ LE exercises compared to last session     Palpation   Palpation comment decreased horizional scar adhesions in R LQ of abdomen. Increased scar restrctions along longitudinal scar in R LQ and lumbar                      [facility] Adult PT Treatment/Exercise - 03/02/16 1008      Manual Therapy   Manual therapy comments abdominal lymph massage --> light scar release over LQ                  PT Education - 03/02/16 1010    Education provided Yes   Education Details practice colon massage and maintain UE/LE HEP   Person(s) Educated Patient   Methods Explanation;Demonstration;Tactile cues;Verbal cues;Handout   Comprehension Verbal cues required;Returned demonstration;Verbalized understanding;Tactile  cues required             PT Long Term Goals - 02/17/16 2130      PT LONG TERM GOAL #1   Title Pt will report improved stool consistency Bristol Stool Type 4 for 100% of the time in order to improve bowel function   Time 12   Period Weeks   Status New     PT LONG TERM GOAL #2   Title Pt will report decreased dependence on laxatives from every 4 days to once a week in order to show improved bowel function and to increase community particiaption   Time 12   Period Weeks   Status New     PT LONG TERM GOAL #3   Title Pt will decrease his score on COREFO from 35% to < 30% in order to demo improved bowel function.   Time 12   Period Weeks   Status New     PT LONG TERM GOAL #4   Title Pt will demo decreased scar restrictions over abdomen and lumbar spine, increased diaphragmatic excursion/ coordination of deep core mm across 2  visits in order to promote motility   Time 12   Period Weeks   Status New               Plan - 03/02/16 1011    Clinical Impression Statement Pt is reporting bowel movements that occur every other day and shows decreased LQ R abdominal adhesions. Pt showed improved ROM with UE/LE exercises and was encouraged to maintain physical activty and colon massage. Pt will continue to benefit from skilled PT. Plan to address lumbar scar adhesions at next session.    Rehab Potential Good   Clinical Impairments Affecting Rehab Potential biopsychosocial factors: wife passed, lives alone   PT Frequency 1x / week   PT Duration 12 weeks   PT Treatment/Interventions ADLs/Self Care Home Management;Biofeedback;Gait training;Stair training;Functional mobility training;Moist Heat;Therapeutic activities;Therapeutic exercise;Balance training;Scar mobilization;Manual lymph drainage;Patient/family education;Manual techniques;Neuromuscular re-education;Passive range of motion;Taping   Consulted and Agree with Plan of Care Patient      Patient will benefit from skilled therapeutic intervention in order to improve the following deficits and impairments:  Abnormal gait, Increased fascial restricitons, Improper body mechanics, Pain, Decreased coordination, Decreased mobility, Decreased scar mobility, Increased muscle spasms, Postural dysfunction, Decreased range of motion, Decreased activity tolerance, Decreased balance, Decreased safety awareness, Difficulty walking, Impaired flexibility  Visit Diagnosis: Other lack of coordination  Other abnormalities of gait and mobility  Chronic low back pain, unspecified back pain laterality, with sciatica presence unspecified  Muscle weakness (generalized)     Problem List Patient Active Problem List   Diagnosis Date Noted  . Microscopic hematuria 03/10/2015  . Left renal mass 03/10/2015  . BPH with obstruction/lower urinary tract symptoms 03/10/2015     Jerl Mina ,PT, DPT, E-RYT  03/02/2016, 10:14 AM  [place] MAIN [facility] SERVICES [address] [place], [place], [address] Phone: [phone]   Fax:  [phone]  Name: Theodore Padilla MRN: [id number] Date of Birth: [birth year]

## 2016-03-08 ENCOUNTER — Ambulatory Visit: Payer: Medicare Other | Admitting: Physical Therapy

## 2016-03-08 DIAGNOSIS — R278 Other lack of coordination: Secondary | ICD-10-CM

## 2016-03-08 DIAGNOSIS — G8929 Other chronic pain: Secondary | ICD-10-CM

## 2016-03-08 DIAGNOSIS — R2689 Other abnormalities of gait and mobility: Secondary | ICD-10-CM

## 2016-03-08 DIAGNOSIS — M545 Low back pain: Secondary | ICD-10-CM

## 2016-03-08 DIAGNOSIS — M6281 Muscle weakness (generalized): Secondary | ICD-10-CM

## 2016-03-08 NOTE — Patient Instructions (Signed)
Drink room temperature water instead of cold water  Drink a glass of water before orange juice and coffee first thing in the morning   Abdominal massage (to the left in circles) in the morning to stimulate bowel or after meals)   Inhalation to open the anal sphincter with bowel movements instead of pushing with belly muscles. No more pushing.   To decrease risk for skin sores: offload on buttock every 20 minutes   Continue with exercises to move your joints before the point of pain. Daily

## 2016-03-08 NOTE — Therapy (Signed)
[place] MAIN [facility] SERVICES [address] [place], [place], [address] Phone: [phone]   Fax:  [phone]  Physical Therapy Treatment  Patient Details  Name: Theodore Padilla MRN: [id number] Date of Birth: [birth year] Referring Provider: Josephina Gip, PA  Encounter Date: 03/08/2016      PT End of Session - 03/08/16 0942    Visit Number 4   Number of Visits 12   Date for PT Re-Evaluation 05/19/2016   Authorization Type g codes   PT Start Time 0900   PT Stop Time [id number]   PT Time Calculation (min) 55 min   Activity Tolerance Patient tolerated treatment well;No increased pain   Behavior During Therapy WFL for tasks assessed/performed      Past Medical History:  Diagnosis Date  . Arthritis   . BPH (benign prostatic hyperplasia)   . Chronic constipation   . Coronary artery disease    with stenting x 1  . Gout   . History of kidney stones   . HLD (hyperlipidemia)   . Hypertension   . Shortness of breath    occasional  . Sleep apnea   . Urinary hesitancy   . Urinary retention     Past Surgical History:  Procedure Laterality Date  . APPENDECTOMY    . CARDIAC CATHETERIZATION  2000   with stent  . FOREIGN BODY REMOVAL     L knee  . HERNIA REPAIR     umbilicus (below)   . KIDNEY STONE SURGERY    . LITHOTRIPSY    . LUMBAR LAMINECTOMY/DECOMPRESSION MICRODISCECTOMY  01/22/2012   Procedure: LUMBAR LAMINECTOMY/DECOMPRESSION MICRODISCECTOMY 1 LEVEL;  Surgeon: Ophelia Charter, MD;  Location: [facility];  Service: Neurosurgery;  Laterality: N/A;  Lumbar two and lumbar three laminectomies  . TONSILLECTOMY    . TOTAL HIP ARTHROPLASTY     bilat    There were no vitals filed for this visit.      Subjective Assessment - 03/08/16 0903    Subjective Pt reported his bowel movement occured 5 days after last session. Pt continues to do his movement exercises but he stops when it starts to bother his back.             [facility] PT  Assessment - 03/08/16 0944      Observation/Other Assessments   Skin Integrity two quarter size of thinned skin at the gluteal cleft , no open sores noted   Focus on Therapeutic Outcomes (FOTO)                     Pelvic Floor Special Questions - 03/08/16 0943    Pelvic Floor Internal Exam pt consented verbally without contraindication   Exam Type Rectal   Palpation delayed lengthening of pelvic floor mm, EAS hyeractivity, increased muscle tensions at puborectalis R 4-6 o' clock    Strength good squeeze, good lift, able to hold agaisnt strong resistance           [facility] Adult PT Treatment/Exercise - 03/08/16 0945      Therapeutic Activites    Therapeutic Activities --  see pt instructions     Manual Therapy   Manual therapy comments intrarectal releases at puborectalis                 PT Education - 03/08/16 0942    Education provided Yes   Education Details HEP   Person(s) Educated Patient   Methods Explanation;Demonstration;Tactile cues;Verbal cues;Handout   Comprehension Verbalized  understanding;Returned demonstration             PT Long Term Goals - 03/08/16 0950      PT LONG TERM GOAL #1   Title Pt will report improved stool consistency Bristol Stool Type 4 for 75% of the time in order to improve bowel function   Time 12   Period Weeks   Status On-going     PT LONG TERM GOAL #2   Title Pt will report decreased dependence on laxatives from every 4 days to once a week in order to show improved bowel function and to increase community particiaption   Time 12   Period Weeks   Status On-going     PT LONG TERM GOAL #3   Title Pt will decrease his score on COREFO from 35% to < 30% in order to demo improved bowel function.   Time 12   Period Weeks   Status On-going     PT LONG TERM GOAL #4   Title Pt will demo decreased scar restrictions over abdomen and lumbar spine, increased diaphragmatic excursion/ coordination of deep core mm across 2  visits in order to promote motility   Time 12   Period Weeks   Status Achieved     PT LONG TERM GOAL #5   Title Pt will demo decreased pelvic floor tensions and increased ability to lengthen muscles with simulated cue for bowel movements in order to not strain and  promote elimination of bowels.    Time 12   Period Weeks   Status New               Plan - 03/08/16 0945    Clinical Impression Statement Pt demo'd dyscoordination of pelvic floor mm (abdominal straining with simulated bowel movement)  and increased pelvic floor mm tensions. Following Tx today, pt demo'd correct coordination and decreased pelvic floor mm tensions. Pt reported he felt more relaxed after the training.  Pt was educated to perform abdominal massage and to drink room temperature water first thing in the morning (instead of OJ and coffee) to promote motility.  Pt was educated to minimize skin sores with weight shifting every 20 minutes. Pt voiced understanding. Pt will continue to benefit from skilled PT.      Rehab Potential Good   Clinical Impairments Affecting Rehab Potential biopsychosocial factors: wife passed, lives alone   PT Frequency 1x / week   PT Duration 12 weeks   PT Treatment/Interventions ADLs/Self Care Home Management;Biofeedback;Gait training;Stair training;Functional mobility training;Moist Heat;Therapeutic activities;Therapeutic exercise;Balance training;Scar mobilization;Manual lymph drainage;Patient/family education;Manual techniques;Neuromuscular re-education;Passive range of motion;Taping   Consulted and Agree with Plan of Care Patient      Patient will benefit from skilled therapeutic intervention in order to improve the following deficits and impairments:  Abnormal gait, Increased fascial restricitons, Improper body mechanics, Pain, Decreased coordination, Decreased mobility, Decreased scar mobility, Increased muscle spasms, Postural dysfunction, Decreased range of motion, Decreased  activity tolerance, Decreased balance, Decreased safety awareness, Difficulty walking, Impaired flexibility  Visit Diagnosis: Other lack of coordination  Other abnormalities of gait and mobility  Chronic low back pain, unspecified back pain laterality, with sciatica presence unspecified  Muscle weakness (generalized)     Problem List Patient Active Problem List   Diagnosis Date Noted  . Microscopic hematuria 03/10/2015  . Left renal mass 03/10/2015  . BPH with obstruction/lower urinary tract symptoms 03/10/2015    Jerl Mina  ,PT, DPT, E-RYT  03/08/2016, 9:52 AM  [place]  [place], [place], [address] Phone: [phone]   Fax:  [phone]  Name: Jhase Rothwell MRN: [id number] Date of Birth: 08/28/1921

## 2016-03-15 ENCOUNTER — Encounter: Payer: Medicare Other | Admitting: Physical Therapy

## 2016-03-29 ENCOUNTER — Ambulatory Visit: Payer: Medicare Other | Attending: Gastroenterology | Admitting: Physical Therapy

## 2016-03-29 DIAGNOSIS — M6281 Muscle weakness (generalized): Secondary | ICD-10-CM | POA: Insufficient documentation

## 2016-03-29 DIAGNOSIS — R2689 Other abnormalities of gait and mobility: Secondary | ICD-10-CM | POA: Insufficient documentation

## 2016-03-29 DIAGNOSIS — G8929 Other chronic pain: Secondary | ICD-10-CM | POA: Diagnosis present

## 2016-03-29 DIAGNOSIS — M545 Low back pain: Secondary | ICD-10-CM | POA: Insufficient documentation

## 2016-03-29 DIAGNOSIS — R278 Other lack of coordination: Secondary | ICD-10-CM | POA: Diagnosis present

## 2016-03-29 NOTE — Therapy (Addendum)
[place] MAIN [facility] SERVICES [address] [place], [place], [address] Phone: [phone]   Fax:  [phone]  Physical Therapy Treatment / Discharge Summary   Patient Details  Name: Theodore Padilla MRN: [id number] Date of Birth: [birth year] Referring Provider: Josephina Gip, PA  Encounter Date: 03/29/2016      PT End of Session - 03/29/16 1142    Visit Number 5   Number of Visits 12   Date for PT Re-Evaluation 05-10-16   Authorization Type g codes   PT Start Time 2836   PT Stop Time 6294   PT Time Calculation (min) 40 min   Activity Tolerance Patient tolerated treatment well;No increased pain   Behavior During Therapy WFL for tasks assessed/performed      Past Medical History:  Diagnosis Date  . Arthritis   . BPH (benign prostatic hyperplasia)   . Chronic constipation   . Coronary artery disease    with stenting x 1  . Gout   . History of kidney stones   . HLD (hyperlipidemia)   . Hypertension   . Shortness of breath    occasional  . Sleep apnea   . Urinary hesitancy   . Urinary retention     Past Surgical History:  Procedure Laterality Date  . APPENDECTOMY    . CARDIAC CATHETERIZATION  2000   with stent  . FOREIGN BODY REMOVAL     L knee  . HERNIA REPAIR     umbilicus (below)   . KIDNEY STONE SURGERY    . LITHOTRIPSY    . LUMBAR LAMINECTOMY/DECOMPRESSION MICRODISCECTOMY  01/22/2012   Procedure: LUMBAR LAMINECTOMY/DECOMPRESSION MICRODISCECTOMY 1 LEVEL;  Surgeon: Ophelia Charter, MD;  Location: [facility];  Service: Neurosurgery;  Laterality: N/A;  Lumbar two and lumbar three laminectomies  . TONSILLECTOMY    . TOTAL HIP ARTHROPLASTY     bilat    There were no vitals filed for this visit.      Subjective Assessment - 03/29/16 1108    Subjective Pt reported he feels happy that he has seen some improvement. He has been able to have 6 bowel movements without laxatives                        Pelvic Floor Special Questions - 03/29/16 1139    Pelvic Floor Internal Exam pt consented verbally without contraindication   Exam Type Rectal   Palpation no mm tensions , ability to lengthen pelvic floor without cuing   Strength good squeeze, good lift, able to hold agaisnt strong resistance           [facility] Adult PT Treatment/Exercise - 03/29/16 1140      Therapeutic Activites    Therapeutic Activities --  see pt nstructions/ education                PT Education - 03/29/16 1141    Education provided Yes   Education Details HEP, d/c, discussed microwavable meal plate that have frozen veggies to increase fiber intake, and increase water intake, and continue with HEP after d/c . Pt declined home Health PT for home mobility and ADLs .     Person(s) Educated Patient   Methods Explanation;Demonstration;Tactile cues;Verbal cues   Comprehension Returned demonstration;Verbalized understanding             PT Long Term Goals - 03/29/16 1108      PT LONG TERM GOAL #1   Title Pt will  report improved stool consistency Bristol Stool Type 4 for 75% of the time in order to improve bowel function   Time 12   Period Weeks   Status Partially Met     PT LONG TERM GOAL #2   Title Pt will report decreased dependence on laxatives from every 4 days to once a week in order to show improved bowel function and to increase community particiaption   Time 12   Period Weeks   Status Achieved     PT LONG TERM GOAL #3   Title Pt will decrease his score on COREFO from 35% to < 30% in order to demo improved bowel function. (04/07/16: 16%)    Time 12   Period Weeks   Status Achieved     PT LONG TERM GOAL #4   Title Pt will demo decreased scar restrictions over abdomen and lumbar spine, increased diaphragmatic excursion/ coordination of deep core mm across 2 visits in order to promote motility   Time 12   Period Weeks   Status Achieved     PT LONG TERM GOAL #5   Title Pt will demo  decreased pelvic floor tensions and increased ability to lengthen muscles with simulated cue for bowel movements in order to not strain and  promote elimination of bowels.    Time 12   Period Weeks   Status Achieved               Plan - 04/07/16 1143    Clinical Impression Statement Pt has achieved 4/5 goals with ability to have 6 bowel movements without laxatives and more frequent bowel movements (from every 4 days to every 3 days). Pt demo'd signifciantly decreased abdominal scar restrictions, decreased pelvic floor mm tensions, and improved coordination to lengthen his pelvic floor mm to defecate.  Pt partially met his remaining goal for improved stool consistency and was educated on increasing his fiber intake with strategies to work with his limitations to cooking. Pt voiced understanding. PT recommended Home health PT to address his limited mobility 2/2 CLBP but pt declined.   Pt is ready for d/c.    Rehab Potential Good   Clinical Impairments Affecting Rehab Potential biopsychosocial factors: wife passed, lives alone   PT Frequency 1x / week   PT Duration 12 weeks   PT Treatment/Interventions ADLs/Self Care Home Management;Biofeedback;Gait training;Stair training;Functional mobility training;Moist Heat;Therapeutic activities;Therapeutic exercise;Balance training;Scar mobilization;Manual lymph drainage;Patient/family education;Manual techniques;Neuromuscular re-education;Passive range of motion;Taping   Consulted and Agree with Plan of Care Patient      Patient will benefit from skilled therapeutic intervention in order to improve the following deficits and impairments:  Abnormal gait, Increased fascial restricitons, Improper body mechanics, Pain, Decreased coordination, Decreased mobility, Decreased scar mobility, Increased muscle spasms, Postural dysfunction, Decreased range of motion, Decreased activity tolerance, Decreased balance, Decreased safety awareness, Difficulty walking,  Impaired flexibility  Visit Diagnosis: Other lack of coordination  Other abnormalities of gait and mobility  Chronic low back pain, unspecified back pain laterality, with sciatica presence unspecified  Muscle weakness (generalized)       G-Codes - 2016/04/07 1147    Functional Assessment Tool Used COREFO score 37% --> 16%    Functional Limitation Self care   Self Care Current Status (T5176) At least 1 percent but less than 20 percent impaired, limited or restricted   Self Care Goal Status (H6073) At least 1 percent but less than 20 percent impaired, limited or restricted   Self Care Discharge Status (X1062) At least 1  percent but less than 20 percent impaired, limited or restricted      Problem List Patient Active Problem List   Diagnosis Date Noted  . Microscopic hematuria 03/10/2015  . Left renal mass 03/10/2015  . BPH with obstruction/lower urinary tract symptoms 03/10/2015    Jerl Mina ,PT, DPT, E-RYT  03/29/2016, 11:53 AM  [place] MAIN [facility] SERVICES [address] [place], [place], [address] Phone: [phone]   Fax:  [phone]  Name: Theodore Padilla MRN: [id number] Date of Birth: [birth year]

## 2016-04-13 ENCOUNTER — Encounter: Payer: Medicare Other | Admitting: Physical Therapy

## 2016-04-24 ENCOUNTER — Encounter: Payer: Medicare Other | Admitting: Physical Therapy

## 2016-07-10 ENCOUNTER — Encounter: Payer: Self-pay | Admitting: Emergency Medicine

## 2016-07-10 ENCOUNTER — Inpatient Hospital Stay
Admission: EM | Admit: 2016-07-10 | Discharge: 2016-07-12 | DRG: 379 | Disposition: A | Discharge status: Home / Self Care | Payer: Medicare Other | Attending: Specialist | Admitting: Specialist

## 2016-07-10 ENCOUNTER — Emergency Department: Payer: Medicare Other

## 2016-07-10 DIAGNOSIS — I2510 Atherosclerotic heart disease of native coronary artery without angina pectoris: Secondary | ICD-10-CM | POA: Diagnosis present

## 2016-07-10 DIAGNOSIS — Z881 Allergy status to other antibiotic agents status: Secondary | ICD-10-CM

## 2016-07-10 DIAGNOSIS — Z880 Allergy status to penicillin: Secondary | ICD-10-CM

## 2016-07-10 DIAGNOSIS — K922 Gastrointestinal hemorrhage, unspecified: Secondary | ICD-10-CM | POA: Diagnosis present

## 2016-07-10 DIAGNOSIS — Z87442 Personal history of urinary calculi: Secondary | ICD-10-CM | POA: Diagnosis not present

## 2016-07-10 DIAGNOSIS — Z888 Allergy status to other drugs, medicaments and biological substances status: Secondary | ICD-10-CM

## 2016-07-10 DIAGNOSIS — E875 Hyperkalemia: Secondary | ICD-10-CM | POA: Diagnosis not present

## 2016-07-10 DIAGNOSIS — K5909 Other constipation: Secondary | ICD-10-CM | POA: Diagnosis not present

## 2016-07-10 DIAGNOSIS — Z87891 Personal history of nicotine dependence: Secondary | ICD-10-CM

## 2016-07-10 DIAGNOSIS — M109 Gout, unspecified: Secondary | ICD-10-CM | POA: Diagnosis not present

## 2016-07-10 DIAGNOSIS — Z79899 Other long term (current) drug therapy: Secondary | ICD-10-CM

## 2016-07-10 DIAGNOSIS — Z955 Presence of coronary angioplasty implant and graft: Secondary | ICD-10-CM

## 2016-07-10 DIAGNOSIS — G4733 Obstructive sleep apnea (adult) (pediatric): Secondary | ICD-10-CM | POA: Diagnosis present

## 2016-07-10 DIAGNOSIS — N400 Enlarged prostate without lower urinary tract symptoms: Secondary | ICD-10-CM | POA: Diagnosis present

## 2016-07-10 DIAGNOSIS — E785 Hyperlipidemia, unspecified: Secondary | ICD-10-CM | POA: Diagnosis present

## 2016-07-10 DIAGNOSIS — K921 Melena: Principal | ICD-10-CM | POA: Diagnosis present

## 2016-07-10 DIAGNOSIS — D649 Anemia, unspecified: Secondary | ICD-10-CM | POA: Diagnosis not present

## 2016-07-10 DIAGNOSIS — K5900 Constipation, unspecified: Secondary | ICD-10-CM

## 2016-07-10 DIAGNOSIS — Z96643 Presence of artificial hip joint, bilateral: Secondary | ICD-10-CM | POA: Diagnosis present

## 2016-07-10 DIAGNOSIS — K269 Duodenal ulcer, unspecified as acute or chronic, without hemorrhage or perforation: Secondary | ICD-10-CM | POA: Diagnosis present

## 2016-07-10 DIAGNOSIS — Z7982 Long term (current) use of aspirin: Secondary | ICD-10-CM | POA: Diagnosis not present

## 2016-07-10 DIAGNOSIS — R55 Syncope and collapse: Secondary | ICD-10-CM | POA: Diagnosis present

## 2016-07-10 LAB — COMPREHENSIVE METABOLIC PANEL
ALBUMIN: 3.3 g/dL — AB (ref 3.5–5.0)
ALT: 20 U/L (ref 17–63)
ANION GAP: 10 (ref 5–15)
AST: 27 U/L (ref 15–41)
Alkaline Phosphatase: 62 U/L (ref 38–126)
BUN: 62 mg/dL — ABNORMAL HIGH (ref 6–20)
CO2: 24 mmol/L (ref 22–32)
Calcium: 8.6 mg/dL — ABNORMAL LOW (ref 8.9–10.3)
Chloride: 105 mmol/L (ref 101–111)
Creatinine, Ser: 1.68 mg/dL — ABNORMAL HIGH (ref 0.61–1.24)
GFR calc Af Amer: 39 mL/min — ABNORMAL LOW (ref >60)
GFR calc non Af Amer: 34 mL/min — ABNORMAL LOW (ref >60)
GLUCOSE: 176 mg/dL — AB (ref 65–99)
POTASSIUM: 5.3 mmol/L — AB (ref 3.5–5.1)
SODIUM: 139 mmol/L (ref 135–145)
TOTAL PROTEIN: 6.6 g/dL (ref 6.5–8.1)
Total Bilirubin: 0.5 mg/dL (ref 0.3–1.2)

## 2016-07-10 LAB — CBC WITH DIFFERENTIAL/PLATELET
BASOS PCT: 1 %
Basophils Absolute: 0.1 10*3/uL (ref 0–0.1)
EOS ABS: 0.3 10*3/uL (ref 0–0.7)
EOS PCT: 3 %
HCT: 33 % — ABNORMAL LOW (ref 40.0–52.0)
Hemoglobin: 11.4 g/dL — ABNORMAL LOW (ref 13.0–18.0)
Lymphocytes Relative: 11 %
Lymphs Abs: 1.4 10*3/uL (ref 1.0–3.6)
MCH: 32.9 pg (ref 26.0–34.0)
MCHC: 34.5 g/dL (ref 32.0–36.0)
MCV: 95.2 fL (ref 80.0–100.0)
MONO ABS: 0.9 10*3/uL (ref 0.2–1.0)
MONOS PCT: 7 %
Neutro Abs: 10.6 10*3/uL — ABNORMAL HIGH (ref 1.4–6.5)
Neutrophils Relative %: 80 %
Platelets: 312 10*3/uL (ref 150–440)
RBC: 3.46 MIL/uL — ABNORMAL LOW (ref 4.40–5.90)
RDW: 14.2 % (ref 11.5–14.5)
WBC: 13.3 10*3/uL — ABNORMAL HIGH (ref 3.8–10.6)

## 2016-07-10 LAB — MRSA PCR SCREENING: MRSA by PCR: NEGATIVE

## 2016-07-10 LAB — TYPE AND SCREEN
ABO/RH(D): A POS
Antibody Screen: NEGATIVE

## 2016-07-10 LAB — APTT: aPTT: 29 seconds (ref 24–36)

## 2016-07-10 LAB — LIPASE, BLOOD: LIPASE: 23 U/L (ref 11–51)

## 2016-07-10 LAB — HEMOGLOBIN: HEMOGLOBIN: 11.2 g/dL — AB (ref 13.0–18.0)

## 2016-07-10 LAB — PROTIME-INR
INR: 1.12
PROTHROMBIN TIME: 14.5 s (ref 11.4–15.2)

## 2016-07-10 LAB — TROPONIN I: Troponin I: <0.03 ng/mL (ref <0.03)

## 2016-07-10 MED ORDER — TAMSULOSIN HCL 0.4 MG PO CAPS
0.4000 mg | ORAL_CAPSULE | Freq: Every day | ORAL | Status: DC
  Filled 2016-07-10 (×2): qty 1

## 2016-07-10 MED ORDER — ONDANSETRON HCL 4 MG/2ML IJ SOLN
4.0000 mg | Freq: Four times a day (QID) | INTRAMUSCULAR | Status: DC | PRN
Start: 2016-07-10 — End: 2016-07-12

## 2016-07-10 MED ORDER — FINASTERIDE 5 MG PO TABS
5.0000 mg | ORAL_TABLET | Freq: Every day | ORAL | Status: DC
  Administered 2016-07-11 – 2016-07-12 (×2): 5 mg via ORAL
  Filled 2016-07-10 (×2): qty 1

## 2016-07-10 MED ORDER — ACETAMINOPHEN 650 MG RE SUPP: 650.0000 mg | Freq: Four times a day (QID) | RECTAL | Status: DC | PRN

## 2016-07-10 MED ORDER — ONDANSETRON HCL 4 MG PO TABS: 4.0000 mg | ORAL_TABLET | Freq: Four times a day (QID) | ORAL | Status: DC | PRN

## 2016-07-10 MED ORDER — SODIUM CHLORIDE 0.9 % IV SOLN
80.0000 mg | Freq: Once | INTRAVENOUS | Status: DC
  Filled 2016-07-10: qty 80

## 2016-07-10 MED ORDER — SODIUM CHLORIDE 0.9 % IV SOLN
INTRAVENOUS | Status: DC
  Administered 2016-07-10 – 2016-07-11 (×2): via INTRAVENOUS

## 2016-07-10 MED ORDER — PANTOPRAZOLE SODIUM 40 MG IV SOLR: 40.0000 mg | Freq: Two times a day (BID) | INTRAVENOUS | Status: DC

## 2016-07-10 MED ORDER — ACETAMINOPHEN 325 MG PO TABS: 650.0000 mg | ORAL_TABLET | Freq: Four times a day (QID) | ORAL | Status: DC | PRN

## 2016-07-10 MED ORDER — SODIUM CHLORIDE 0.9 % IV SOLN: 8.0000 mg/h | INTRAVENOUS | Status: DC

## 2016-07-10 MED ORDER — PANTOPRAZOLE SODIUM 40 MG IV SOLR
INTRAVENOUS | Status: AC
  Administered 2016-07-10: 40 mg via INTRAVENOUS
  Filled 2016-07-10: qty 40

## 2016-07-10 MED ORDER — PANTOPRAZOLE SODIUM 40 MG IV SOLR
40.0000 mg | Freq: Two times a day (BID) | INTRAVENOUS | Status: DC
  Administered 2016-07-10 – 2016-07-12 (×4): 40 mg via INTRAVENOUS
  Filled 2016-07-10 (×3): qty 40

## 2016-07-10 NOTE — ED Provider Notes (Signed)
[facility] Emergency Department Provider Note  ____________________________________________   First MD Initiated Contact with Patient 07/10/16 1519     (approximate)  I have reviewed the triage vital signs and the nursing notes.   HISTORY  Chief Complaint Shortness of Breath    HPI Theodore Padilla is a 83 y.o. male who presents for evaluation of constipation, black stools, dizziness/lightheadedness, shortness of breath, and an episode of passing out.  History of chronic constipation, takes milk of magnesia regularly.  Has felt more  constipated than usual lately.  Three or four days ago he had an "explosive" bowel movement that "was black as tar."  He has had difficulty having bowel movements since then, but when he does, it is "pure black".  He has been getting increasingly short of breath, particularly with exertion, and notes that he gets very lightheaded and dizzy when he stands or even sits up.  Also feels like he cannot take a deep breath, as if his abdomen is swollen and it is hard to expand his lungs fully.  Denies chest pain and fever/chills.  Occasional dull stomach cramps.  All symptoms have been gradual in onset over the last 4 days, moderate to severe in intensity.  Reports that he passed out last night while trying to have a bowel movement.   Past Medical History:  Diagnosis Date  . Arthritis   . BPH (benign prostatic hyperplasia)   . Chronic constipation   . Coronary artery disease    with stenting x 1  . Gout   . History of kidney stones   . HLD (hyperlipidemia)   . Hypertension   . Shortness of breath    occasional  . Sleep apnea   . Urinary hesitancy   . Urinary retention     Patient Active Problem List   Diagnosis Date Noted  . Microscopic hematuria 03/10/2015  . Left renal mass 03/10/2015  . BPH with obstruction/lower urinary tract symptoms 03/10/2015    Past Surgical History:  Procedure Laterality Date  . APPENDECTOMY    .  CARDIAC CATHETERIZATION  2000   with stent  . FOREIGN BODY REMOVAL     L knee  . HERNIA REPAIR     umbilicus (below)   . KIDNEY STONE SURGERY    . LITHOTRIPSY    . LUMBAR LAMINECTOMY/DECOMPRESSION MICRODISCECTOMY  01/22/2012   Procedure: LUMBAR LAMINECTOMY/DECOMPRESSION MICRODISCECTOMY 1 LEVEL;  Surgeon: Ophelia Charter, MD;  Location: [facility];  Service: Neurosurgery;  Laterality: N/A;  Lumbar two and lumbar three laminectomies  . TONSILLECTOMY    . TOTAL HIP ARTHROPLASTY     bilat    Prior to Admission medications   Medication Sig Start Date End Date Taking? Authorizing Provider  allopurinol (ZYLOPRIM) 100 MG tablet Take 100 mg by mouth daily.   Yes Historical Provider, MD  aspirin 81 MG tablet Take 81 mg by mouth daily.   Yes Historical Provider, MD  Cyanocobalamin (VITAMIN B 12 PO) Take 1 tablet by mouth daily.   Yes Historical Provider, MD  finasteride (PROSCAR) 5 MG tablet TAKE ONE TABLET BY MOUTH ONCE DAILY 10/14/14  Yes Shannon A McGowan, PA-C  lisinopril-hydrochlorothiazide (PRINZIDE,ZESTORETIC) 10-12.5 MG per tablet Take 1 tablet by mouth daily.   Yes Historical Provider, MD  Tamsulosin HCl (FLOMAX) 0.4 MG CAPS Take 1 capsule (0.4 mg total) by mouth at bedtime. Patient not taking: Reported on 07/10/2016 01/26/12   Newman Pies, MD  triamcinolone cream (KENALOG) 0.1 % Use over  affected areas sparingly as needed for the face 09/22/14   Historical Provider, MD    Allergies Penicillins; Ciprofloxacin; Melatonin; Tramadol; and Rofecoxib  Family History  Problem Relation Age of Onset  . Kidney Stones    . Kidney disease Neg Hx   . Prostate cancer Neg Hx     Social History Social History  Substance Use Topics  . Smoking status: Former Research scientist (life sciences)  . Smokeless tobacco: Not on file     Comment: quit 1960  . Alcohol use 0.0 oz/week     Comment: occasional wine    Review of Systems Constitutional: No fever/chills Eyes: No visual changes. ENT: No sore  throat. Cardiovascular: Denies chest pain.  Syncope episode last night. Respiratory: Denies shortness of breath. Gastrointestinal: No abdominal pain.  No nausea, no vomiting.  No diarrhea.  No constipation. Genitourinary: Negative for dysuria. Musculoskeletal: Negative for back pain. Skin: Negative for rash. Neurological: Negative for headaches, focal weakness or numbness.  10-point ROS otherwise negative.  ____________________________________________   PHYSICAL EXAM:  VITAL SIGNS: ED Triage Vitals  Enc Vitals Group     BP 07/10/16 1438 (!) 111/50     Pulse Rate 07/10/16 1438 95     Resp 07/10/16 1438 20     Temp 07/10/16 1438 97.5 F (36.4 C)     Temp Source 07/10/16 1438 Oral     SpO2 07/10/16 1438 100 %     Weight 07/10/16 1438 222 lb (100.7 kg)     Height 07/10/16 1438 5\' 6"  (1.676 m)     Head Circumference --      Peak Flow --      Pain Score 07/10/16 1439 10     Pain Loc --      Pain Edu? --      Excl. in Gas? --     Constitutional: Alert and oriented. Well appearing and in no acute distress. Eyes: Conjunctivae are normal. PERRL. EOMI. Head: Atraumatic. Nose: No congestion/rhinnorhea. Mouth/Throat: Mucous membranes are moist. Neck: No stridor.  No meningeal signs.   Cardiovascular: Normal rate, regular rhythm. Good peripheral circulation. Grossly normal heart sounds. Respiratory: Normal respiratory effort.  No retractions. Lungs CTAB. Gastrointestinal: Soft and nontender. No distention. Dark black stool which is soft and loose on rectal exam, strongly heme-positive.  Nurse chaperone present throughout exam. Musculoskeletal: No lower extremity tenderness nor edema. No gross deformities of extremities. Neurologic:  Normal speech and language. No gross focal neurologic deficits are appreciated.  Skin:  Skin is warm, dry and intact. No rash noted. Psychiatric: Mood and affect are normal. Speech and behavior are normal.  ____________________________________________    LABS (all labs ordered are listed, but only abnormal results are displayed)  Labs Reviewed  CBC WITH DIFFERENTIAL/PLATELET - Abnormal; Notable for the following:       Result Value   WBC 13.3 (*)    RBC 3.46 (*)    Hemoglobin 11.4 (*)    HCT 33.0 (*)    Neutro Abs 10.6 (*)    All other components within normal limits  COMPREHENSIVE METABOLIC PANEL - Abnormal; Notable for the following:    Potassium 5.3 (*)    Glucose, Bld 176 (*)    BUN 62 (*)    Creatinine, Ser 1.68 (*)    Calcium 8.6 (*)    Albumin 3.3 (*)    GFR calc non Af Amer 34 (*)    GFR calc Af Amer 39 (*)    All other components within normal limits  TROPONIN I  PROTIME-INR  LIPASE, BLOOD  APTT  TYPE AND SCREEN   ____________________________________________  EKG  ED ECG REPORT I, Rever Pichette, the attending physician, personally viewed and interpreted this ECG.  Date: 07/10/2016 EKG Time: 14:39 Rate: 90 Rhythm: sinus rhythm with PR prolongation QRS Axis: normal Intervals: RBBB ST/T Wave abnormalities: normal Conduction Disturbances: none Narrative Interpretation: unremarkable  ____________________________________________  RADIOLOGY   Dg Chest Portable 1 View  Result Date: 07/10/2016 CLINICAL DATA:  Shortness of Breath EXAM: PORTABLE CHEST 1 VIEW COMPARISON:  09/11/2013 FINDINGS: Cardiac shadow is mildly enlarged but stable. Aortic calcifications are again seen. Lungs are well aerated bilaterally. Stable nodular density is noted in the left base dating back to 2013 consistent with a benign etiology. No focal infiltrate or sizable effusion is seen. No bony abnormality is noted. IMPRESSION: No acute abnormality seen. Electronically Signed   By: Inez Catalina M.D.   On: 07/10/2016 15:03    ____________________________________________   PROCEDURES  Procedure(s) performed:   Procedures   Critical Care performed: No ____________________________________________   INITIAL IMPRESSION / ASSESSMENT  AND PLAN / ED COURSE  Pertinent labs & imaging results that were available during my care of the patient were reviewed by me and considered in my medical decision making (see chart for details).  Given the patient's very obvious melena and symptomatic anemia (though Hb is still 11.4), will admit for further management and treatment.  GI is on call today and tomorrow.  Giving pantoprazole 40 mg IV per hospitalist recommendations.  No clear indication of patient's SOB complaints, likely related to the bloating sensation in his abdomen.  EKG, chest x-ray, and troponin are all unremarkable.     ____________________________________________  FINAL CLINICAL IMPRESSION(S) / ED DIAGNOSES  Final diagnoses:  Melena  Symptomatic anemia     MEDICATIONS GIVEN DURING THIS VISIT:  Medications  pantoprazole (PROTONIX) injection 40 mg (40 mg Intravenous Given 07/10/16 1603)     NEW OUTPATIENT MEDICATIONS STARTED DURING THIS VISIT:  New Prescriptions   No medications on file    Modified Medications   No medications on file    Discontinued Medications   DOCUSATE SODIUM (COLACE) 100 MG CAPSULE    Take 100 mg by mouth daily.   FUROSEMIDE (LASIX) 20 MG TABLET    Take by mouth.   PANTOPRAZOLE (PROTONIX) 40 MG TABLET    Take by mouth.     Note:  This document was prepared using Dragon voice recognition software and may include unintentional dictation errors.    Hinda Kehr, MD 07/10/16 [phone]

## 2016-07-10 NOTE — ED Notes (Signed)
Pt states he had syncopal episode trying to have a bowel movement

## 2016-07-10 NOTE — ED Triage Notes (Signed)
SOB x 2 days, states feels like he cant get a full breath. States had dark BM 3 days ago after taking laxative.

## 2016-07-10 NOTE — H&P (Signed)
[facility] NAME: Theodore Padilla    MR#:  [id number]  DATE OF BIRTH:  28-Aug-1921  DATE OF ADMISSION:  07/10/2016  PRIMARY CARE PHYSICIAN: Madelyn Brunner, MD   REQUESTING/REFERRING PHYSICIAN:   CHIEF COMPLAINT:   Chief Complaint  Patient presents with  . Shortness of Breath    HISTORY OF PRESENT ILLNESS: Theodore Padilla  is a 83 y.o. male with a known history of Arthritis, benign prostate hypertrophy, chronic constipation, coronary artery disease, gout, chronic kidney disease, hyperlipidemia, hypertension presented to the emergency room with black stool for the last 3-4 days. Patient usually uses laxatives for chronic constipation. He has noticed dark stool and he also felt dizzy and about possible when he went for bowel movement. Patient was evaluated in the emergency room hemoglobin was greater than 11. Patient has generalized weakness. He also felt short of breath and fatigue on day to day activities. Patient lives alone at home and uses a scooter to move around. Hospitalist service was consulted for further care of the patient. Has mild abdominal pain. No complaints of nausea and vomiting. No complaints of any coughing of blood.  PAST MEDICAL HISTORY:   Past Medical History:  Diagnosis Date  . Arthritis   . BPH (benign prostatic hyperplasia)   . Chronic constipation   . Coronary artery disease    with stenting x 1  . Gout   . History of kidney stones   . HLD (hyperlipidemia)   . Hypertension   . Shortness of breath    occasional  . Sleep apnea   . Urinary hesitancy   . Urinary retention     PAST SURGICAL HISTORY: Past Surgical History:  Procedure Laterality Date  . APPENDECTOMY    . CARDIAC CATHETERIZATION  2000   with stent  . FOREIGN BODY REMOVAL     L knee  . HERNIA REPAIR     umbilicus (below)   . KIDNEY STONE SURGERY    . LITHOTRIPSY    . LUMBAR LAMINECTOMY/DECOMPRESSION MICRODISCECTOMY  01/22/2012    Procedure: LUMBAR LAMINECTOMY/DECOMPRESSION MICRODISCECTOMY 1 LEVEL;  Surgeon: Ophelia Charter, MD;  Location: [facility];  Service: Neurosurgery;  Laterality: N/A;  Lumbar two and lumbar three laminectomies  . TONSILLECTOMY    . TOTAL HIP ARTHROPLASTY     bilat    SOCIAL HISTORY:  Social History  Substance Use Topics  . Smoking status: Former Research scientist (life sciences)  . Smokeless tobacco: Never Used     Comment: quit 1960  . Alcohol use 0.0 oz/week     Comment: occasional wine    FAMILY HISTORY:  Family History  Problem Relation Age of Onset  . Kidney Stones    . Kidney disease Neg Hx   . Prostate cancer Neg Hx     DRUG ALLERGIES:  Allergies  Allergen Reactions  . Penicillins Anaphylaxis  . Ciprofloxacin Other (See Comments)  . Melatonin Nausea And Vomiting  . Tramadol Nausea And Vomiting  . Rofecoxib Rash    Other Reaction: Not Assessed    REVIEW OF SYSTEMS:   CONSTITUTIONAL: No fever, has fatigue, weakness.  EYES: No blurred or double vision.  EARS, NOSE, AND THROAT: No tinnitus or ear pain.  RESPIRATORY: No cough, an episode of shortness of breath,  No wheezing or hemoptysis.  CARDIOVASCULAR: No chest pain, orthopnea, edema.  GASTROINTESTINAL: No nausea, vomiting, diarrhea,  mild abdominal pain.  Has constipation and black stool. GENITOURINARY: No dysuria, hematuria.  ENDOCRINE: No polyuria, nocturia,  HEMATOLOGY: No anemia, easy bruising or bleeding SKIN: No rash or lesion. MUSCULOSKELETAL: No joint pain or arthritis.   NEUROLOGIC: No tingling, numbness, weakness.  PSYCHIATRY: No anxiety or depression.   MEDICATIONS AT HOME:  Prior to Admission medications   Medication Sig Start Date End Date Taking? Authorizing Provider  allopurinol (ZYLOPRIM) 100 MG tablet Take 100 mg by mouth daily.   Yes Historical Provider, MD  aspirin 81 MG tablet Take 81 mg by mouth daily.   Yes Historical Provider, MD  Cyanocobalamin (VITAMIN B 12 PO) Take 1 tablet by mouth daily.   Yes  Historical Provider, MD  finasteride (PROSCAR) 5 MG tablet TAKE ONE TABLET BY MOUTH ONCE DAILY 10/14/14  Yes Shannon A McGowan, PA-C  lisinopril-hydrochlorothiazide (PRINZIDE,ZESTORETIC) 10-12.5 MG per tablet Take 1 tablet by mouth daily.   Yes Historical Provider, MD  Tamsulosin HCl (FLOMAX) 0.4 MG CAPS Take 1 capsule (0.4 mg total) by mouth at bedtime. Patient not taking: Reported on 07/10/2016 01/26/12   Newman Pies, MD  triamcinolone cream (KENALOG) 0.1 % Use over affected areas sparingly as needed for the face 09/22/14   Historical Provider, MD      PHYSICAL EXAMINATION:   VITAL SIGNS: Blood pressure (!) 111/50, pulse 95, temperature 97.5 F (36.4 C), temperature source Oral, resp. rate 20, height 5\' 6"  (1.676 m), weight 100.7 kg (222 lb), SpO2 100 %.  GENERAL:  83 y.o.-year-old patient lying in the bed with no acute distress.  EYES: Pupils equal, round, reactive to light and accommodation. No scleral icterus. Extraocular muscles intact.  HEENT: Head atraumatic, normocephalic. Oropharynx and nasopharynx clear.  NECK:  Supple, no jugular venous distention. No thyroid enlargement, no tenderness.  LUNGS: Normal breath sounds bilaterally, no wheezing, rales,rhonchi or crepitation. No use of accessory muscles of respiration.  CARDIOVASCULAR: S1, S2 normal. No murmurs, rubs, or gallops.  ABDOMEN: Soft, mild tenderness around umbilicus, nondistended. Bowel sounds present. No organomegaly or mass.  EXTREMITIES: No pedal edema, cyanosis, or clubbing.  NEUROLOGIC: Cranial nerves II through XII are intact. Muscle strength 5/5 in all extremities. Sensation intact. Gait not checked.  PSYCHIATRIC: The patient is alert and oriented x 3.  SKIN: No obvious rash, lesion, or ulcer.   LABORATORY PANEL:   CBC  Recent Labs Lab 07/10/16 1445  WBC 13.3*  HGB 11.4*  HCT 33.0*  PLT 312  MCV 95.2  MCH 32.9  MCHC 34.5  RDW 14.2  LYMPHSABS 1.4  MONOABS 0.9  EOSABS 0.3  BASOSABS 0.1    ------------------------------------------------------------------------------------------------------------------  Chemistries   Recent Labs Lab 07/10/16 1445  NA 139  K 5.3*  CL 105  CO2 24  GLUCOSE 176*  BUN 62*  CREATININE 1.68*  CALCIUM 8.6*  AST 27  ALT 20  ALKPHOS 62  BILITOT 0.5   ------------------------------------------------------------------------------------------------------------------ estimated creatinine clearance is 31.2 mL/min (by C-G formula based on SCr of 1.68 mg/dL (H)). ------------------------------------------------------------------------------------------------------------------ No results for input(s): TSH, T4TOTAL, T3FREE, THYROIDAB in the last 72 hours.  Invalid input(s): FREET3   Coagulation profile No results for input(s): INR, PROTIME in the last 168 hours. ------------------------------------------------------------------------------------------------------------------- No results for input(s): DDIMER in the last 72 hours. -------------------------------------------------------------------------------------------------------------------  Cardiac Enzymes  Recent Labs Lab 07/10/16 1445  TROPONINI <0.03   ------------------------------------------------------------------------------------------------------------------ Invalid input(s): POCBNP  ---------------------------------------------------------------------------------------------------------------  Urinalysis    Component Value Date/Time   COLORURINE Yellow 08/29/2014 1638   APPEARANCEUR Clear 03/10/2015 1102   LABSPEC 1.013 08/29/2014 1638   PHURINE 5.0 08/29/2014 1638   GLUCOSEU  Negative 03/10/2015 1102   GLUCOSEU Negative 08/29/2014 1638   HGBUR 1+ 08/29/2014 1638   BILIRUBINUR Negative 03/10/2015 1102   BILIRUBINUR Negative 08/29/2014 1638   KETONESUR Negative 08/29/2014 1638   PROTEINUR 2+ (A) 03/10/2015 1102   PROTEINUR Negative 08/29/2014 1638   NITRITE  Negative 03/10/2015 1102   NITRITE Negative 08/29/2014 1638   LEUKOCYTESUR Negative 03/10/2015 1102   LEUKOCYTESUR Negative 08/29/2014 1638     RADIOLOGY: Dg Chest Portable 1 View  Result Date: 07/10/2016 CLINICAL DATA:  Shortness of Breath EXAM: PORTABLE CHEST 1 VIEW COMPARISON:  09/11/2013 FINDINGS: Cardiac shadow is mildly enlarged but stable. Aortic calcifications are again seen. Lungs are well aerated bilaterally. Stable nodular density is noted in the left base dating back to 2013 consistent with a benign etiology. No focal infiltrate or sizable effusion is seen. No bony abnormality is noted. IMPRESSION: No acute abnormality seen. Electronically Signed   By: Inez Catalina M.D.   On: 07/10/2016 15:03    EKG: Orders placed or performed during the hospital encounter of 07/10/16  . ED EKG  . ED EKG    IMPRESSION AND PLAN: 83 year old elderly male patient with history of arthritis, gout, hyperlipidemia, hypertension presented to the emergency room with black stool, dizziness and fatigue. Admitting diagnosis 1. Gastrointestinal bleeding 2. Mild hyperkalemia 3. Near syncope 4. Hypertension 5. Hyperlipidemia 6. Gout 7. Constipation Treatment plan Admit patient to medical floor Clear liquid diet IV fluid hydration Serial hemoglobin monitoring Follow-up electrolytes Gastroenterology consultation DVT prophylaxis sequential compression device to Lower extremities Supportive care.  All the records are reviewed and case discussed with ED provider. Management plans discussed with the patient, family and they are in agreement.  CODE STATUS:FULL CODE Code Status History    This patient does not have a recorded code status. Please follow your organizational policy for patients in this situation.       TOTAL TIME TAKING CARE OF THIS PATIENT: 52 minutes.    Saundra Shelling M.D on 07/10/2016 at 4:37 PM  Between 7am to 6pm - Pager - [phone]  After 6pm go to [email or web address] -  password [email or web address] [facility]  Office  [phone]  CC: Primary care physician; Madelyn Brunner, MD

## 2016-07-11 ENCOUNTER — Inpatient Hospital Stay: Payer: Medicare Other

## 2016-07-11 DIAGNOSIS — K5730 Diverticulosis of large intestine without perforation or abscess without bleeding: Secondary | ICD-10-CM | POA: Diagnosis not present

## 2016-07-11 DIAGNOSIS — K921 Melena: Secondary | ICD-10-CM

## 2016-07-11 LAB — BASIC METABOLIC PANEL WITH GFR
Anion gap: 5 (ref 5–15)
BUN: 59 mg/dL — ABNORMAL HIGH (ref 6–20)
CO2: 25 mmol/L (ref 22–32)
Calcium: 7.8 mg/dL — ABNORMAL LOW (ref 8.9–10.3)
Chloride: 106 mmol/L (ref 101–111)
Creatinine, Ser: 1.33 mg/dL — ABNORMAL HIGH (ref 0.61–1.24)
GFR calc Af Amer: 52 mL/min — ABNORMAL LOW (ref >60)
GFR calc non Af Amer: 45 mL/min — ABNORMAL LOW (ref >60)
Glucose, Bld: 103 mg/dL — ABNORMAL HIGH (ref 65–99)
Potassium: 5.2 mmol/L — ABNORMAL HIGH (ref 3.5–5.1)
Sodium: 136 mmol/L (ref 135–145)

## 2016-07-11 LAB — HEMOGLOBIN
Hemoglobin: 9.4 g/dL — ABNORMAL LOW (ref 13.0–18.0)
Hemoglobin: 9.5 g/dL — ABNORMAL LOW (ref 13.0–18.0)
Hemoglobin: 9.9 g/dL — ABNORMAL LOW (ref 13.0–18.0)

## 2016-07-11 MED ORDER — BISACODYL 10 MG RE SUPP
10.0000 mg | Freq: Every day | RECTAL | Status: DC | PRN
  Administered 2016-07-11: 10 mg via RECTAL
  Filled 2016-07-11: qty 1

## 2016-07-11 NOTE — Consult Note (Signed)
Jonathon Bellows MD  [address] [place], [place] [address] Phone: [phone] Fax : [phone]  Consultation  Referring Provider:    Dr Estanislado Pandy Primary Care Physician:  Madelyn Brunner, MD Primary Gastroenterologist:  None         Reason for Consultation:     GI bleed  Date of Admission:  07/10/2016 Date of Consultation:  07/11/2016         HPI:   Theodore Padilla is a 83 y.o. male presented to the ER yesterday with 4 days of black stools. On admission Hb was 11.5 . Subsequent Hb was 9.9 and 9.4 . Last Hb in 08/2014 was 8.4 grams . MCV 95.   He says that a month back he had tarry black stools and had some lab work with his physician. Subsequently resolved and recurred after a bowel movement on Friday . Since then he has had none. He says he has had generalized abdominal pain for many years. He says that he has a bowel movement every 4 days only after a dose of milk of magnesia. Presently denies any discomfort. Denies any NSAID use. On baby asprin.   Past Medical History:  Diagnosis Date  . Arthritis   . BPH (benign prostatic hyperplasia)   . Chronic constipation   . Coronary artery disease    with stenting x 1  . Gout   . History of kidney stones   . HLD (hyperlipidemia)   . Hypertension   . Shortness of breath    occasional  . Sleep apnea   . Urinary hesitancy   . Urinary retention     Past Surgical History:  Procedure Laterality Date  . APPENDECTOMY    . CARDIAC CATHETERIZATION  2000   with stent  . FOREIGN BODY REMOVAL     L knee  . HERNIA REPAIR     umbilicus (below)   . KIDNEY STONE SURGERY    . LITHOTRIPSY    . LUMBAR LAMINECTOMY/DECOMPRESSION MICRODISCECTOMY  01/22/2012   Procedure: LUMBAR LAMINECTOMY/DECOMPRESSION MICRODISCECTOMY 1 LEVEL;  Surgeon: Ophelia Charter, MD;  Location: [facility];  Padilla: Neurosurgery;  Laterality: N/A;  Lumbar two and lumbar three laminectomies  . TONSILLECTOMY    . TOTAL HIP ARTHROPLASTY     bilat    Prior to Admission  medications   Medication Sig Start Date End Date Taking? Authorizing Provider  allopurinol (ZYLOPRIM) 100 MG tablet Take 100 mg by mouth daily.   Yes Historical Provider, MD  aspirin 81 MG tablet Take 81 mg by mouth daily.   Yes Historical Provider, MD  Cyanocobalamin (VITAMIN B 12 PO) Take 1 tablet by mouth daily.   Yes Historical Provider, MD  finasteride (PROSCAR) 5 MG tablet TAKE ONE TABLET BY MOUTH ONCE DAILY 10/14/14  Yes Shannon A McGowan, PA-C  lisinopril-hydrochlorothiazide (PRINZIDE,ZESTORETIC) 10-12.5 MG per tablet Take 1 tablet by mouth daily.   Yes Historical Provider, MD  Tamsulosin HCl (FLOMAX) 0.4 MG CAPS Take 1 capsule (0.4 mg total) by mouth at bedtime. Patient not taking: Reported on 07/10/2016 01/26/12   Newman Pies, MD  triamcinolone cream (KENALOG) 0.1 % Use over affected areas sparingly as needed for the face 09/22/14   Historical Provider, MD    Family History  Problem Relation Age of Onset  . Kidney Stones    . Kidney disease Neg Hx   . Prostate cancer Neg Hx      Social History  Substance Use Topics  . Smoking status: Former Research scientist (life sciences)  .  Smokeless tobacco: Never Used     Comment: quit 1960  . Alcohol use 0.0 oz/week     Comment: occasional wine    Allergies as of 07/10/2016 - Review Complete 07/10/2016  Allergen Reaction Noted  . Penicillins Anaphylaxis 01/12/2012  . Ciprofloxacin Other (See Comments) 03/10/2015  . Melatonin Nausea And Vomiting 03/10/2015  . Tramadol Nausea And Vomiting 01/12/2012  . Rofecoxib Rash 03/10/2015    Review of Systems:    All systems reviewed and negative except where noted in HPI.   Physical Exam:  Vital signs in last 24 hours: Temp:  [97.5 F (36.4 C)-98.2 F (36.8 C)] 97.8 F (36.6 C) (02/27 0627) Pulse Rate:  [57-95] 64 (02/27 0627) Resp:  [18-26] 18 (02/27 0627) BP: (102-120)/(39-83) 113/83 (02/27 0627) SpO2:  [97 %-100 %] 100 % (02/27 0627) Weight:  [222 lb (100.7 kg)-225 lb 6.4 oz (102.2 kg)] 225 lb 6.4 oz  (102.2 kg) (02/26 1956) Last BM Date: 07/07/16 General:   Pleasant, cooperative in NAD Head:  Normocephalic and atraumatic. Eyes:   No icterus.   Conjunctiva pink. PERRLA. Ears:  Normal auditory acuity. Neck:  Supple; no masses or thyroidomegaly Lungs: Respirations even and unlabored. Lungs clear to auscultation bilaterally.   No wheezes, crackles, or rhonchi.  Heart:  Regular rate and rhythm;  Without murmur, clicks, rubs or gallops Abdomen:  Soft, nondistended, nontender. Normal bowel sounds. No appreciable masses or hepatomegaly.  No rebound or guarding.  Rectal:  Not performed. Msk:  Symmetrical without gross deformities.   Extremities:  Without edema, cyanosis or clubbing. Neurologic:  Alert and oriented x3;  grossly normal neurologically. Cervical Nodes:  No significant cervical adenopathy. Psych:  Alert and cooperative. Normal affect.  LAB RESULTS:  Recent Labs  07/10/16 1445 07/10/16 1834 07/11/16 0013 07/11/16 0602  WBC 13.3*  --   --   --   HGB 11.4* 11.2* 9.9* 9.4*  HCT 33.0*  --   --   --   PLT 312  --   --   --    BMET  Recent Labs  07/10/16 1445 07/11/16 0602  NA 139 136  K 5.3* 5.2*  CL 105 106  CO2 24 25  GLUCOSE 176* 103*  BUN 62* 59*  CREATININE 1.68* 1.33*  CALCIUM 8.6* 7.8*   LFT  Recent Labs  07/10/16 1445  PROT 6.6  ALBUMIN 3.3*  AST 27  ALT 20  ALKPHOS 62  BILITOT 0.5   PT/INR  Recent Labs  07/10/16 1555  LABPROT 14.5  INR 1.12    STUDIES: Dg Chest Portable 1 View  Result Date: 07/10/2016 CLINICAL DATA:  Shortness of Breath EXAM: PORTABLE CHEST 1 VIEW COMPARISON:  09/11/2013 FINDINGS: Cardiac shadow is mildly enlarged but stable. Aortic calcifications are again seen. Lungs are well aerated bilaterally. Stable nodular density is noted in the left base dating back to 2013 consistent with a benign etiology. No focal infiltrate or sizable effusion is seen. No bony abnormality is noted. IMPRESSION: No acute abnormality seen.  Electronically Signed   By: Inez Catalina M.D.   On: 07/10/2016 15:03      Impression / Plan:   Theodore Padilla is a 83 y.o. y/o male with history of on and off tarry black stool , drop in Hb with rehydration since admission but no further bowel movements. He is at his baseline Hb compared to a year back . Since this has recurred on two occasions will perform an EGD  Plan   1. EGD  tomorrow 2. PPI 3. Daily miralax for chronic constipation.    Thank you for involving me in the care of this patient.    I have discussed alternative options, risks & benefits,  which include, but are not limited to, bleeding, infection, perforation,respiratory complication & drug reaction.  The patient agrees with this plan & written consent will be obtained.      LOS: 1 day   Jonathon Bellows, MD  07/11/2016, 10:18 AM

## 2016-07-11 NOTE — Evaluation (Signed)
Physical Therapy Evaluation Patient Details Name: Theodore Padilla MRN: [id number] DOB: June 22, 1921 Today's Date: 07/11/2016   History of Present Illness  Pt is a 83 y.o. male presenting to hospital with SOB, constipation, black stools, dizzyness/lightheadedness, and episode of passing out (trying to have BM).  Pt admitted with GI bleeding, mild hyperkalemia, htn, gout, constipation, and near syncope.  PMH includes CAD with stenting x1, htn, B THA, h/o L-spine surgery; chronic back issues per pt  Clinical Impression  Prior to hospital admission, pt was modified independent with functional mobility (walked short distances with SPC or used motorized scooter d/t h/o back issues).  Pt lives alone in 1 level home with ramp to enter.  Currently pt is CGA with transfers and CGA to min assist walking a few feet bed to chair with SPC (pt holding onto furniture with other UE for stability and appears to be a "furniture cruiser" at home; pt declined use of RW d/t preference for [facility] use).  Pt did demonstrate some SOB with activity but O2 sats >95% on room air and HR 70-88 bpm during session.  Pt would benefit from skilled PT to address noted impairments and functional limitations.  Recommend pt discharge to home with HHPT when medically appropriate.    Follow Up Recommendations Home health PT    Equipment Recommendations  Rolling walker with 5" wheels    Recommendations for Other Services       Precautions / Restrictions Precautions Precautions: Fall Restrictions Weight Bearing Restrictions: No      Mobility  Bed Mobility Overal bed mobility: Needs Assistance Bed Mobility: Supine to Sit     Supine to sit: Mod assist;HOB elevated     General bed mobility comments: assist for trunk; pt reports he sleeps in recliner and not in bed d/t back issues  Transfers Overall transfer level: Needs assistance Equipment used: Straight cane Transfers: Sit to/from Stand Sit to Stand: Min guard          General transfer comment: fairly strong stand from bed; use of SPC and bed rail for support  Ambulation/Gait Ambulation/Gait assistance: Min guard;Min assist Ambulation Distance (Feet): 3 Feet (bed to recliner) Assistive device: Straight cane   Gait velocity: decreased   General Gait Details: decreased B step length/foot clearance/heelstrike; pt using SPC in one UE and reaching for furniture to steady self with other UE  Stairs            Wheelchair Mobility    Modified Rankin (Stroke Patients Only)       Balance Overall balance assessment: Needs assistance;History of Falls Sitting-balance support: Bilateral upper extremity supported;Feet supported Sitting balance-Leahy Scale: Good Sitting balance - Comments: reaching within BOS sitting edge of bed   Standing balance support: Single extremity supported (UE support on SPC) Standing balance-Leahy Scale: Fair Standing balance comment: static standing briefly (d/t back pain)                             Pertinent Vitals/Pain Pain Assessment: 0-10 Pain Score: 5  Pain Location: chronic back pain Pain Descriptors / Indicators: Constant;Aching Pain Intervention(s): Limited activity within patient's tolerance;Monitored during session;Repositioned    Home Living Family/patient expects to be discharged to:: Private residence Living Arrangements: Alone   Type of Home: House Home Access: Ramped entrance     Home Layout: One level Home Equipment: [employer] - single point;Electric scooter      Prior Function Level of Independence: Independent with assistive device(s)  Comments: Walks at most 100 feet with SPC (requires rest breaks); otherwise uses scooter in home as needed (d/t back issues) and also in community for groceries/errands.  Pt reports 3 falls in past 6 months (2 falls forward and 1 to side).     Hand Dominance        Extremity/Trunk Assessment   Upper Extremity Assessment Upper  Extremity Assessment: Generalized weakness    Lower Extremity Assessment Lower Extremity Assessment: Generalized weakness       Communication   Communication: HOH  Cognition Arousal/Alertness: Awake/alert Behavior During Therapy: WFL for tasks assessed/performed Overall Cognitive Status: Within Functional Limits for tasks assessed                      General Comments   Nursing cleared pt for participation in physical therapy.  Pt agreeable to PT session.    Exercises     Assessment/Plan    PT Assessment Patient needs continued PT services  PT Problem List Decreased strength;Decreased activity tolerance;Decreased balance;Decreased mobility;Pain       PT Treatment Interventions DME instruction;Gait training;Functional mobility training;Therapeutic activities;Therapeutic exercise;Balance training;Patient/family education    PT Goals (Current goals can be found in the Care Plan section)  Acute Rehab PT Goals Patient Stated Goal: to go home PT Goal Formulation: With patient Time For Goal Achievement: 07/25/16 Potential to Achieve Goals: Good    Frequency Min 2X/week   Barriers to discharge        Co-evaluation               End of Session Equipment Utilized During Treatment: Gait belt Activity Tolerance: Patient limited by pain Patient left: in chair;with call bell/phone within reach;with chair alarm set Nurse Communication: Mobility status;Precautions (via white board) PT Visit Diagnosis: Difficulty in walking, not elsewhere classified (R26.2);History of falling (Z91.81)         Time: [id number] PT Time Calculation (min) (ACUTE ONLY): 18 min   Charges:   PT Evaluation $PT Eval Low Complexity: 1 Procedure     PT G CodesLeitha Bleak, PT 07/11/16, 2:33 PM [phone]

## 2016-07-11 NOTE — Progress Notes (Signed)
[facility] [place] NAME: Theodore Padilla    MR#:  [id number]  DATE OF BIRTH:  June 14, 1921  SUBJECTIVE:   Pt. here due to shortness of breath and noted to have melanotic stools.  Hemoglobin has slightly trended down. Shortness of breath much improved. Seen by gastroenterology plan for endoscopy tomorrow.  REVIEW OF SYSTEMS:    Review of Systems  Constitutional: Negative for chills and fever.  HENT: Negative for congestion and tinnitus.   Eyes: Negative for blurred vision and double vision.  Respiratory: Negative for cough, shortness of breath and wheezing.   Cardiovascular: Negative for chest pain, orthopnea and PND.  Gastrointestinal: Negative for abdominal pain, diarrhea, nausea and vomiting.  Genitourinary: Negative for dysuria and hematuria.  Neurological: Negative for dizziness, sensory change and focal weakness.  All other systems reviewed and are negative.   Nutrition: Clear liquid Tolerating Diet: Yes Tolerating PT: Bedbound at baseline   DRUG ALLERGIES:   Allergies  Allergen Reactions  . Penicillins Anaphylaxis  . Ciprofloxacin Other (See Comments)  . Melatonin Nausea And Vomiting  . Tramadol Nausea And Vomiting  . Rofecoxib Rash    Other Reaction: Not Assessed    VITALS:  Blood pressure (!) 120/40, pulse (!) 56, temperature 97.9 F (36.6 C), temperature source Oral, resp. rate 17, height 5\' 6"  (1.676 m), weight 102.2 kg (225 lb 6.4 oz), SpO2 97 %.  PHYSICAL EXAMINATION:   Physical Exam  GENERAL:  83 y.o.-year-old patient lying in bed in no acute distress.  EYES: Pupils equal, round, reactive to light and accommodation. No scleral icterus. Extraocular muscles intact.  HEENT: Head atraumatic, normocephalic. Oropharynx and nasopharynx clear.  NECK:  Supple, no jugular venous distention. No thyroid enlargement, no tenderness.  LUNGS: Normal breath sounds bilaterally, no wheezing, rales, rhonchi. No use of accessory  muscles of respiration.  CARDIOVASCULAR: S1, S2 normal. No murmurs, rubs, or gallops.  ABDOMEN: Soft, nontender, nondistended. Bowel sounds present. No organomegaly or mass.  EXTREMITIES: No cyanosis, clubbing or edema b/l.    NEUROLOGIC: Cranial nerves II through XII are intact. No focal Motor or sensory deficits b/l.   PSYCHIATRIC: The patient is alert and oriented x 3.  SKIN: No obvious rash, lesion, or ulcer.    LABORATORY PANEL:   CBC  Recent Labs Lab 07/10/16 1445  07/11/16 1235  WBC 13.3*  --   --   HGB 11.4*  < > 9.5*  HCT 33.0*  --   --   PLT 312  --   --   < > = values in this interval not displayed. ------------------------------------------------------------------------------------------------------------------  Chemistries   Recent Labs Lab 07/10/16 1445 07/11/16 0602  NA 139 136  K 5.3* 5.2*  CL 105 106  CO2 24 25  GLUCOSE 176* 103*  BUN 62* 59*  CREATININE 1.68* 1.33*  CALCIUM 8.6* 7.8*  AST 27  --   ALT 20  --   ALKPHOS 62  --   BILITOT 0.5  --    ------------------------------------------------------------------------------------------------------------------  Cardiac Enzymes  Recent Labs Lab 07/10/16 1445  TROPONINI <0.03   ------------------------------------------------------------------------------------------------------------------  RADIOLOGY:  Dg Abd 1 View  Result Date: 07/11/2016 CLINICAL DATA:  83 year old male with constipation. Abdominal pain and distention. Initial encounter. EXAM: ABDOMEN - 1 VIEW COMPARISON:  09/02/2014 CT. FINDINGS: Moderate stool rectosigmoid region. Mild stool transverse colon and descending colon. No plain film evidence of small bowel obstruction. The possibility of free intraperitoneal air cannot be assessed on a supine view.  Vascular calcifications.  Suggestion of right renal calculi. Scoliosis lumbar spine convex right. Post bilateral hip replacement. IMPRESSION: Moderate stool rectosigmoid region. Mild  stool transverse colon and descending colon. No plain film evidence of small bowel obstruction. Electronically Signed   By: Genia Del M.D.   On: 07/11/2016 10:50   Dg Chest Portable 1 View  Result Date: 07/10/2016 CLINICAL DATA:  Shortness of Breath EXAM: PORTABLE CHEST 1 VIEW COMPARISON:  09/11/2013 FINDINGS: Cardiac shadow is mildly enlarged but stable. Aortic calcifications are again seen. Lungs are well aerated bilaterally. Stable nodular density is noted in the left base dating back to 2013 consistent with a benign etiology. No focal infiltrate or sizable effusion is seen. No bony abnormality is noted. IMPRESSION: No acute abnormality seen. Electronically Signed   By: Inez Catalina M.D.   On: 07/10/2016 15:03     ASSESSMENT AND PLAN:   83 year old male with past medical history of hypertension, hyperlipidemia, BPH, chronic constipation, obstructive sleep apnea, osteoarthritis who presents to the hospital due to shortness of breath and also noted to have heme positive stools.  1. GI bleed-patient presented to the hospital with melanotic stools. His hemoglobin has drifted down from 11-9. -No acute bleeding overnight of this morning. -Hold aspirin, appreciate gastroenterology input and plan for upper GI endoscopy tomorrow. -Continue PPI twice a day.  2. Chronic constipation-patient's KUB today showing some moderate stool in the rectosigmoid area.  -I will order a Dulcolax suppository.  3. BPH-continue Flomax, finasteride.  4. Hyperkalemia - mild.  Will monitor.  Hold Lisinopril/HCTZ for now.    All the records are reviewed and case discussed with Care Management/Social Worker. Management plans discussed with the patient, family and they are in agreement.  CODE STATUS: Full code  DVT Prophylaxis: TEd's and SCD's.   TOTAL TIME TAKING CARE OF THIS PATIENT: 30 minutes.   POSSIBLE D/C IN 1-2 DAYS, DEPENDING ON CLINICAL CONDITION.   Henreitta Leber M.D on 07/11/2016 at 2:33  PM  Between 7am to 6pm - Pager - [phone]  After 6pm go to [email or web address] - Proofreader  [facility]  Office  [phone]  CC: Primary care physician; Madelyn Brunner, MD

## 2016-07-12 ENCOUNTER — Encounter: Payer: Self-pay | Admitting: *Deleted

## 2016-07-12 ENCOUNTER — Other Ambulatory Visit: Payer: Self-pay

## 2016-07-12 ENCOUNTER — Inpatient Hospital Stay: Payer: Medicare Other | Admitting: Anesthesiology

## 2016-07-12 ENCOUNTER — Encounter
Admission: EM | Disposition: A | Discharge status: Home / Self Care | Payer: Self-pay | Source: Home / Self Care | Attending: Specialist

## 2016-07-12 DIAGNOSIS — K269 Duodenal ulcer, unspecified as acute or chronic, without hemorrhage or perforation: Secondary | ICD-10-CM | POA: Diagnosis not present

## 2016-07-12 DIAGNOSIS — K2971 Gastritis, unspecified, with bleeding: Secondary | ICD-10-CM

## 2016-07-12 DIAGNOSIS — K921 Melena: Principal | ICD-10-CM

## 2016-07-12 HISTORY — PX: ESOPHAGOGASTRODUODENOSCOPY (EGD) WITH PROPOFOL: SHX5813

## 2016-07-12 LAB — CBC
HEMATOCRIT: 29.2 % — AB (ref 40.0–52.0)
Hemoglobin: 9.7 g/dL — ABNORMAL LOW (ref 13.0–18.0)
MCH: 31.8 pg (ref 26.0–34.0)
MCHC: 33.1 g/dL (ref 32.0–36.0)
MCV: 96 fL (ref 80.0–100.0)
PLATELETS: 244 10*3/uL (ref 150–440)
RBC: 3.05 MIL/uL — AB (ref 4.40–5.90)
RDW: 13.8 % (ref 11.5–14.5)
WBC: 5.7 10*3/uL (ref 3.8–10.6)

## 2016-07-12 LAB — BASIC METABOLIC PANEL
ANION GAP: 5 (ref 5–15)
BUN: 42 mg/dL — ABNORMAL HIGH (ref 6–20)
CHLORIDE: 107 mmol/L (ref 101–111)
CO2: 26 mmol/L (ref 22–32)
Calcium: 8.1 mg/dL — ABNORMAL LOW (ref 8.9–10.3)
Creatinine, Ser: 1.18 mg/dL (ref 0.61–1.24)
GFR calc non Af Amer: 52 mL/min — ABNORMAL LOW (ref >60)
GFR, EST AFRICAN AMERICAN: 60 mL/min — AB (ref >60)
Glucose, Bld: 114 mg/dL — ABNORMAL HIGH (ref 65–99)
Potassium: 4.5 mmol/L (ref 3.5–5.1)
Sodium: 138 mmol/L (ref 135–145)

## 2016-07-12 SURGERY — ESOPHAGOGASTRODUODENOSCOPY (EGD) WITH PROPOFOL
Anesthesia: General

## 2016-07-12 MED ORDER — PANTOPRAZOLE SODIUM 40 MG PO TBEC: 40.0000 mg | DELAYED_RELEASE_TABLET | Freq: Every day | ORAL | 1 refills | Status: DC

## 2016-07-12 MED ORDER — PANTOPRAZOLE SODIUM 40 MG PO TBEC: 40.0000 mg | DELAYED_RELEASE_TABLET | Freq: Two times a day (BID) | ORAL | 1 refills | Status: DC

## 2016-07-12 MED ORDER — SODIUM CHLORIDE 0.9 % IV SOLN
INTRAVENOUS | Status: DC
  Administered 2016-07-12 (×2): via INTRAVENOUS

## 2016-07-12 MED ORDER — PROPOFOL 500 MG/50ML IV EMUL
INTRAVENOUS | Status: AC
  Filled 2016-07-12: qty 50

## 2016-07-12 MED ORDER — PROPOFOL 10 MG/ML IV BOLUS
INTRAVENOUS | Status: DC | PRN
  Administered 2016-07-12: 10 mg via INTRAVENOUS
  Administered 2016-07-12: 30 mg via INTRAVENOUS

## 2016-07-12 NOTE — Progress Notes (Signed)
Patient discharge teaching given, including activity, diet, follow-up appoints, and medications. Patient verbalized understanding of all discharge instructions. IV accesses were d/c'd. Vitals are stable. Skin is intact except as charted in most recent assessments. Pt to be escorted out by NT, to be driven home by family.  Donnella Morford [employer]

## 2016-07-12 NOTE — Plan of Care (Signed)
Problem: Bowel/Gastric: Goal: Will not experience complications related to bowel motility Outcome: Not Progressing No BMs

## 2016-07-12 NOTE — Anesthesia Preprocedure Evaluation (Signed)
Anesthesia Evaluation  Patient identified by MRN, date of birth, ID band Patient awake    Reviewed: Allergy & Precautions, H&P , NPO status , Patient's Chart, lab work & pertinent test results  History of Anesthesia Complications Negative for: history of anesthetic complications  Airway Mallampati: III  TM Distance: <3 FB Neck ROM: limited    Dental  (+) Poor Dentition, Chipped, Lower Dentures, Partial Upper   Pulmonary shortness of breath and with exertion, sleep apnea , former smoker,    Pulmonary exam normal breath sounds clear to auscultation       Cardiovascular Exercise Tolerance: Poor hypertension, (-) angina+ CAD  (-) Past MI Normal cardiovascular exam+ Valvular Problems/Murmurs MR  Rhythm:regular Rate:Normal     Neuro/Psych negative neurological ROS  negative psych ROS   GI/Hepatic negative GI ROS, Neg liver ROS, neg GERD  ,  Endo/Other  negative endocrine ROS  Renal/GU negative Renal ROS  negative genitourinary   Musculoskeletal   Abdominal   Peds  Hematology negative hematology ROS (+)   Anesthesia Other Findings Past Medical History: No date: Arthritis No date: BPH (benign prostatic hyperplasia) No date: Chronic constipation No date: Coronary artery disease     Comment: with stenting x 1 No date: Gout No date: History of kidney stones No date: HLD (hyperlipidemia) No date: Hypertension No date: Shortness of breath     Comment: occasional No date: Sleep apnea No date: Urinary hesitancy No date: Urinary retention  Past Surgical History: No date: APPENDECTOMY 2000: CARDIAC CATHETERIZATION     Comment: with stent No date: FOREIGN BODY REMOVAL     Comment: L knee No date: HERNIA REPAIR     Comment: umbilicus (below)  No date: KIDNEY STONE SURGERY No date: LITHOTRIPSY 01/22/2012: LUMBAR LAMINECTOMY/DECOMPRESSION MICRODISCECTO*     Comment: Procedure: LUMBAR LAMINECTOMY/DECOMPRESSION        MICRODISCECTOMY 1 LEVEL;  Surgeon: Ophelia Charter, MD;  Location: [facility];  Service:              Neurosurgery;  Laterality: N/A;  Lumbar two and              lumbar three laminectomies No date: TONSILLECTOMY No date: TOTAL HIP ARTHROPLASTY     Comment: bilat  BMI    Body Mass Index:  36.38 kg/m      Reproductive/Obstetrics negative OB ROS                             Anesthesia Physical Anesthesia Plan  ASA: III  Anesthesia Plan: General   Post-op Pain Management:    Induction:   Airway Management Planned:   Additional Equipment:   Intra-op Plan:   Post-operative Plan:   Informed Consent: I have reviewed the patients History and Physical, chart, labs and discussed the procedure including the risks, benefits and alternatives for the proposed anesthesia with the patient or authorized representative who has indicated his/her understanding and acceptance.   Dental Advisory Given  Plan Discussed with: Anesthesiologist, CRNA and Surgeon  Anesthesia Plan Comments:         Anesthesia Quick Evaluation

## 2016-07-12 NOTE — Progress Notes (Signed)
EGD  1. Large non bleeding clean based duodenal ulcer. Biopsy taken from edge   Plan   1. Advance diet  2. No NSAID's 3. F/u biopsy 4. H pylori stool antigen  5. Omeprazole 40 mg OD for 6 weeks 6. F/u in my clinic in 2 weeks  I will sign off.  Please call me if any further GI concerns or questions.  We would like to thank you for the opportunity to participate in the care of Theodore Padilla.   Dr Jonathon Bellows  Gastroenterology/Hepatology Pager: [phone]

## 2016-07-12 NOTE — Progress Notes (Signed)
PT Cancellation Note:  Pt currently off floor for procedure and not available for PT session.  Will re-attempt PT treatment session at a later date/time.  Leitha Bleak, PT 07/12/16, 12:19 PM [phone]

## 2016-07-12 NOTE — Discharge Summary (Signed)
[name]: Theodore Padilla    MR#:  [id number]  DATE OF BIRTH:  31-Oct-1921  DATE OF ADMISSION:  07/10/2016 ADMITTING PHYSICIAN: Saundra Shelling, MD  DATE OF DISCHARGE: 07/12/2016  PRIMARY CARE PHYSICIAN: Madelyn Brunner, MD    ADMISSION DIAGNOSIS:  Melena [K92.1] Symptomatic anemia [D64.9]  DISCHARGE DIAGNOSIS:  Principal Problem:   GI bleed   SECONDARY DIAGNOSIS:   Past Medical History:  Diagnosis Date  . Arthritis   . BPH (benign prostatic hyperplasia)   . Chronic constipation   . Coronary artery disease    with stenting x 1  . Gout   . History of kidney stones   . HLD (hyperlipidemia)   . Hypertension   . Shortness of breath    occasional  . Sleep apnea   . Urinary hesitancy   . Urinary retention     HOSPITAL COURSE:   83 year old male with past medical history of hypertension, hyperlipidemia, BPH, chronic constipation, obstructive sleep apnea, osteoarthritis who presents to the hospital due to shortness of breath and also noted to have heme positive stools.  1. GI bleed-patient presented to the hospital with melanotic stools. His hemoglobin drifted down from 11-9. -Patient was seen by gastroenterology and underwent an upper GI endoscopy which showed a nonbleeding duodenal ulcer which was biopsied. Patient's hemoglobin has remained stable, he is tolerating a regular diet well now. He is being discharged home on Protonix daily and a follow-up with gastroenterology in 2 weeks.  2. Chronic constipation-patient's KUB showing some moderate stool in the rectosigmoid area. Patient was given a Dulcolax suppository with some response. He will continue his milk of magnesia as an outpatient.  3. BPH- pt. Will continue  finasteride.  4. Hyperkalemia - resolved w/ IV fluid hydration. Pt. Will resume his HCTZ/Lisinopril.   5. HTN - pt. Will resume his Lisinopril/HCTZ upon discharge.   DISCHARGE CONDITIONS:    Stable.   CONSULTS OBTAINED:  Treatment Team:  Jonathon Bellows, MD  DRUG ALLERGIES:   Allergies  Allergen Reactions  . Penicillins Anaphylaxis  . Ciprofloxacin Other (See Comments)  . Melatonin Nausea And Vomiting  . Tramadol Nausea And Vomiting  . Rofecoxib Rash    Other Reaction: Not Assessed    DISCHARGE MEDICATIONS:   Allergies as of 07/12/2016      Reactions   Penicillins Anaphylaxis   Ciprofloxacin Other (See Comments)   Melatonin Nausea And Vomiting   Tramadol Nausea And Vomiting   Rofecoxib Rash   Other Reaction: Not Assessed      Medication List    STOP taking these medications   tamsulosin 0.4 MG Caps capsule Commonly known as:  FLOMAX     TAKE these medications   allopurinol 100 MG tablet Commonly known as:  ZYLOPRIM Take 100 mg by mouth daily.   aspirin 81 MG tablet Take 81 mg by mouth daily.   finasteride 5 MG tablet Commonly known as:  PROSCAR TAKE ONE TABLET BY MOUTH ONCE DAILY   lisinopril-hydrochlorothiazide 10-12.5 MG tablet Commonly known as:  PRINZIDE,ZESTORETIC Take 1 tablet by mouth daily.   pantoprazole 40 MG tablet Commonly known as:  PROTONIX Take 1 tablet (40 mg total) by mouth daily.   triamcinolone cream 0.1 % Commonly known as:  KENALOG Use over affected areas sparingly as needed for the face   VITAMIN B 12 PO Take 1 tablet by mouth daily.         DISCHARGE INSTRUCTIONS:  DIET:  Cardiac diet  DISCHARGE CONDITION:  Stable  ACTIVITY:  Activity as tolerated  OXYGEN:  Home Oxygen: No.   Oxygen Delivery: room air  DISCHARGE LOCATION:  home   If you experience worsening of your admission symptoms, develop shortness of breath, life threatening emergency, suicidal or homicidal thoughts you must seek medical attention immediately by calling 911 or calling your MD immediately  if symptoms less severe.  You Must read complete instructions/literature along with all the possible adverse reactions/side effects for  all the Medicines you take and that have been prescribed to you. Take any new Medicines after you have completely understood and accpet all the possible adverse reactions/side effects.   Please note  You were cared for by a hospitalist during your hospital stay. If you have any questions about your discharge medications or the care you received while you were in the hospital after you are discharged, you can call the unit and asked to speak with the hospitalist on call if the hospitalist that took care of you is not available. Once you are discharged, your primary care physician will handle any further medical issues. Please note that NO REFILLS for any discharge medications will be authorized once you are discharged, as it is imperative that you return to your primary care physician (or establish a relationship with a primary care physician if you do not have one) for your aftercare needs so that they can reassess your need for medications and monitor your lab values.     Today   NO abdominal pain, shortness of breath.  NO N/V. S/p Endoscopy showing non-bleeding duodenal ulcer.    VITAL SIGNS:  Blood pressure (!) 149/53, pulse 60, temperature 97.6 F (36.4 C), temperature source Oral, resp. rate 19, height 5\' 6"  (1.676 m), weight 102.2 kg (225 lb 6.4 oz), SpO2 98 %.  I/O:   Intake/Output Summary (Last 24 hours) at 07/12/16 1452 Last data filed at 07/12/16 1251  Gross per 24 hour  Intake              140 ml  Output              700 ml  Net             -560 ml    PHYSICAL EXAMINATION:   GENERAL:  83 y.o.-year-old patient lying in bed in no acute distress.  EYES: Pupils equal, round, reactive to light and accommodation. No scleral icterus. Extraocular muscles intact.  HEENT: Head atraumatic, normocephalic. Oropharynx and nasopharynx clear.  NECK:  Supple, no jugular venous distention. No thyroid enlargement, no tenderness.  LUNGS: Normal breath sounds bilaterally, no wheezing, rales,  rhonchi. No use of accessory muscles of respiration.  CARDIOVASCULAR: S1, S2 normal. No murmurs, rubs, or gallops.  ABDOMEN: Soft, nontender, nondistended. Bowel sounds present. No organomegaly or mass.  EXTREMITIES: No cyanosis, clubbing or edema b/l.    NEUROLOGIC: Cranial nerves II through XII are intact. No focal Motor or sensory deficits b/l.   PSYCHIATRIC: The patient is alert and oriented x 3.  SKIN: No obvious rash, lesion, or ulcer.   DATA REVIEW:   CBC  Recent Labs Lab 07/12/16 0454  WBC 5.7  HGB 9.7*  HCT 29.2*  PLT 244    Chemistries   Recent Labs Lab 07/10/16 1445  07/12/16 0454  NA 139  < > 138  K 5.3*  < > 4.5  CL 105  < > 107  CO2 24  < > 26  GLUCOSE 176*  < > 114*  BUN 62*  < > 42*  CREATININE 1.68*  < > 1.18  CALCIUM 8.6*  < > 8.1*  AST 27  --   --   ALT 20  --   --   ALKPHOS 62  --   --   BILITOT 0.5  --   --   < > = values in this interval not displayed.  Cardiac Enzymes  Recent Labs Lab 07/10/16 1445  TROPONINI <0.03    Microbiology Results  Results for orders placed or performed during the hospital encounter of 07/10/16  MRSA PCR Screening     Status: None   Collection Time: 07/10/16  6:55 PM  Result Value Ref Range Status   MRSA by PCR NEGATIVE NEGATIVE Final    Comment:        The GeneXpert MRSA Assay (FDA approved for NASAL specimens only), is one component of a comprehensive MRSA colonization surveillance program. It is not intended to diagnose MRSA infection nor to guide or monitor treatment for MRSA infections.     RADIOLOGY:  Dg Abd 1 View  Result Date: 07/11/2016 CLINICAL DATA:  83 year old male with constipation. Abdominal pain and distention. Initial encounter. EXAM: ABDOMEN - 1 VIEW COMPARISON:  09/02/2014 CT. FINDINGS: Moderate stool rectosigmoid region. Mild stool transverse colon and descending colon. No plain film evidence of small bowel obstruction. The possibility of free intraperitoneal air cannot be  assessed on a supine view. Vascular calcifications.  Suggestion of right renal calculi. Scoliosis lumbar spine convex right. Post bilateral hip replacement. IMPRESSION: Moderate stool rectosigmoid region. Mild stool transverse colon and descending colon. No plain film evidence of small bowel obstruction. Electronically Signed   By: Genia Del M.D.   On: 07/11/2016 10:50   Dg Chest Portable 1 View  Result Date: 07/10/2016 CLINICAL DATA:  Shortness of Breath EXAM: PORTABLE CHEST 1 VIEW COMPARISON:  09/11/2013 FINDINGS: Cardiac shadow is mildly enlarged but stable. Aortic calcifications are again seen. Lungs are well aerated bilaterally. Stable nodular density is noted in the left base dating back to 2013 consistent with a benign etiology. No focal infiltrate or sizable effusion is seen. No bony abnormality is noted. IMPRESSION: No acute abnormality seen. Electronically Signed   By: Inez Catalina M.D.   On: 07/10/2016 15:03      Management plans discussed with the patient, family and they are in agreement.  CODE STATUS:     Code Status Orders        Start     Ordered   07/10/16 1815  Full code  Continuous     07/10/16 1814    Code Status History    Date Active Date Inactive Code Status Order ID Comments User Context   This patient has a current code status but no historical code status.    Advance Directive Documentation   [place] Most Recent Value  Type of Advance Directive  Living will  Pre-existing out of facility DNR order (yellow form or pink MOST form)  No data  "MOST" Form in Place?  No data      TOTAL TIME TAKING CARE OF THIS PATIENT: 40 minutes.    Henreitta Leber M.D on 07/12/2016 at 2:52 PM  Between 7am to 6pm - Pager - [phone]  After 6pm go to [email or web address] - Proofreader  [facility]  Office  [phone]  CC: Primary care physician; Madelyn Brunner, MD

## 2016-07-12 NOTE — H&P (Signed)
Jonathon Bellows MD [address] [place], [place] [address] Phone: [phone] Fax : [phone]  Primary Care Physician:  Madelyn Brunner, MD Primary Gastroenterologist:  Dr. Jonathon Bellows   Pre-Procedure History & Physical: HPI:  Theodore Padilla is a 83 y.o. male is here for an endoscopy.   Past Medical History:  Diagnosis Date  . Arthritis   . BPH (benign prostatic hyperplasia)   . Chronic constipation   . Coronary artery disease    with stenting x 1  . Gout   . History of kidney stones   . HLD (hyperlipidemia)   . Hypertension   . Shortness of breath    occasional  . Sleep apnea   . Urinary hesitancy   . Urinary retention     Past Surgical History:  Procedure Laterality Date  . APPENDECTOMY    . CARDIAC CATHETERIZATION  2000   with stent  . FOREIGN BODY REMOVAL     L knee  . HERNIA REPAIR     umbilicus (below)   . KIDNEY STONE SURGERY    . LITHOTRIPSY    . LUMBAR LAMINECTOMY/DECOMPRESSION MICRODISCECTOMY  01/22/2012   Procedure: LUMBAR LAMINECTOMY/DECOMPRESSION MICRODISCECTOMY 1 LEVEL;  Surgeon: Ophelia Charter, MD;  Location: [facility];  Service: Neurosurgery;  Laterality: N/A;  Lumbar two and lumbar three laminectomies  . TONSILLECTOMY    . TOTAL HIP ARTHROPLASTY     bilat    Prior to Admission medications   Medication Sig Start Date End Date Taking? Authorizing Provider  allopurinol (ZYLOPRIM) 100 MG tablet Take 100 mg by mouth daily.   Yes Historical Provider, MD  aspirin 81 MG tablet Take 81 mg by mouth daily.   Yes Historical Provider, MD  Cyanocobalamin (VITAMIN B 12 PO) Take 1 tablet by mouth daily.   Yes Historical Provider, MD  finasteride (PROSCAR) 5 MG tablet TAKE ONE TABLET BY MOUTH ONCE DAILY 10/14/14  Yes Shannon A McGowan, PA-C  lisinopril-hydrochlorothiazide (PRINZIDE,ZESTORETIC) 10-12.5 MG per tablet Take 1 tablet by mouth daily.   Yes Historical Provider, MD  Tamsulosin HCl (FLOMAX) 0.4 MG CAPS Take 1 capsule (0.4 mg total) by mouth  at bedtime. Patient not taking: Reported on 07/10/2016 01/26/12   Newman Pies, MD  triamcinolone cream (KENALOG) 0.1 % Use over affected areas sparingly as needed for the face 09/22/14   Historical Provider, MD    Allergies as of 07/10/2016 - Review Complete 07/10/2016  Allergen Reaction Noted  . Penicillins Anaphylaxis 01/12/2012  . Ciprofloxacin Other (See Comments) 03/10/2015  . Melatonin Nausea And Vomiting 03/10/2015  . Tramadol Nausea And Vomiting 01/12/2012  . Rofecoxib Rash 03/10/2015    Family History  Problem Relation Age of Onset  . Kidney Stones    . Kidney disease Neg Hx   . Prostate cancer Neg Hx     Social History   Social History  . Marital status: Married    Spouse name: N/A  . Number of children: N/A  . Years of education: N/A   Occupational History  . retired    Social History Main Topics  . Smoking status: Former Research scientist (life sciences)  . Smokeless tobacco: Never Used     Comment: quit 1960  . Alcohol use 0.0 oz/week     Comment: occasional wine  . Drug use: No  . Sexual activity: Not on file   Other Topics Concern  . Not on file   Social History Narrative  . No narrative on file    Review of  Systems: See HPI, otherwise negative ROS  Physical Exam: BP (!) 147/54   Pulse 61   Temp 97.2 F (36.2 C) (Tympanic)   Resp 20   Ht 5\' 6"  (1.676 m)   Wt 225 lb 6.4 oz (102.2 kg)   SpO2 98%   BMI 36.38 kg/m  General:   Alert,  pleasant and cooperative in NAD Head:  Normocephalic and atraumatic. Neck:  Supple; no masses or thyromegaly. Lungs:  Clear throughout to auscultation.    Heart:  Regular rate and rhythm. Abdomen:  Soft, nontender and nondistended. Normal bowel sounds, without guarding, and without rebound.   Neurologic:  Alert and  oriented x4;  grossly normal neurologically.  Impression/Plan: Theodore Padilla is here for an endoscopy to be performed for gi bleed  Risks, benefits, limitations, and alternatives regarding  endoscopy have been reviewed  with the patient.  Questions have been answered.  All parties agreeable.   Jonathon Bellows, MD  07/12/2016, 12:37 PM

## 2016-07-12 NOTE — Anesthesia Postprocedure Evaluation (Signed)
Anesthesia Post Note  Patient: Theodore Padilla  Procedure(s) Performed: Procedure(s) (LRB): ESOPHAGOGASTRODUODENOSCOPY (EGD) WITH PROPOFOL (N/A)  Patient location during evaluation: Endoscopy Anesthesia Type: General Level of consciousness: awake and alert Pain management: pain level controlled Vital Signs Assessment: post-procedure vital signs reviewed and stable Respiratory status: spontaneous breathing, nonlabored ventilation, respiratory function stable and patient connected to nasal cannula oxygen Cardiovascular status: blood pressure returned to baseline and stable Postop Assessment: no signs of nausea or vomiting Anesthetic complications: no     Last Vitals:  Vitals:   07/12/16 1325 07/12/16 1351  BP: 130/62 (!) 149/53  Pulse: (!) 58 60  Resp: 19   Temp:  36.4 C    Last Pain:  Vitals:   07/12/16 1351  TempSrc: Oral  PainSc:                  Precious Haws Piscitello

## 2016-07-12 NOTE — Care Management Important Message (Signed)
Important Message  Patient Details  Name: Theodore Padilla MRN: [id number] Date of Birth: [birth year]   Medicare Important Message Given:  Yes    Beverly Sessions, RN 07/12/2016, 4:44 PM

## 2016-07-12 NOTE — Care Management (Signed)
Patient admitted for GI bleed.  Patient lives at home alone.  Patient states that he has a "friend" who helps him out, and a brother who lives locally.  PCP Walker.  Patient has cane, walker, and electric scooter in the home.  Patient is independent and still drives.  PT has assessed patient and recommended home health PT.  Patient was offered home health agency preference, and declined home health services.  Patient states "what I could use is some people to check on me and help vacuum".  I offered patient list of PCS services.  Patient states I have had people like that come in my home before, and don't want them coming back out.  No further RNCM needs identified.  RNCM signing off.

## 2016-07-12 NOTE — Transfer of Care (Signed)
Immediate Anesthesia Transfer of Care Note  Patient: Theodore Padilla  Procedure(s) Performed: Procedure(s): ESOPHAGOGASTRODUODENOSCOPY (EGD) WITH PROPOFOL (N/A)  Patient Location: PACU and Endoscopy Unit  Anesthesia Type:General  Level of Consciousness: awake, alert  and oriented  Airway & Oxygen Therapy: Patient Spontanous Breathing and Patient connected to nasal cannula oxygen  Post-op Assessment: Report given to RN and Post -op Vital signs reviewed and stable  Post vital signs: Reviewed and stable  Last Vitals:  Vitals:   07/12/16 1217 07/12/16 1255  BP: (!) 147/54 97/75  Pulse: 61 67  Resp: 20 (!) 21  Temp: 36.2 C (!) 35.8 C    Last Pain:  Vitals:   07/12/16 1255  TempSrc: Tympanic  PainSc:          Complications: No apparent anesthesia complications

## 2016-07-12 NOTE — Op Note (Signed)
[facility] Gastroenterology Patient Name: Theodore Padilla Procedure Date: 07/12/2016 12:41 PM MRN: [id number] Account #: [id number] Date of Birth: December 16, 1921 Admit Type: Inpatient Age: 83 Room: [facility] ENDO ROOM 3 Gender: Male Note Status: Finalized Procedure:            Upper GI endoscopy Indications:          Melena Providers:            Jonathon Bellows MD, MD Referring MD:         Hewitt Blade. Sarina Ser, MD (Referring MD) Medicines:            Monitored Anesthesia Care Complications:        No immediate complications. Procedure:            Pre-Anesthesia Assessment:                       - Prior to the procedure, a History and Physical was                        performed, and patient medications, allergies and                        sensitivities were reviewed. The patient's tolerance of                        previous anesthesia was reviewed.                       - The risks and benefits of the procedure and the                        sedation options and risks were discussed with the                        patient. All questions were answered and informed                        consent was obtained.                       - ASA Grade Assessment: III - A patient with severe                        systemic disease.                       After obtaining informed consent, the endoscope was                        passed under direct vision. Throughout the procedure,                        the patient's blood pressure, pulse, and oxygen                        saturations were monitored continuously. The Endoscope                        was introduced through the mouth, and advanced to the  third part of duodenum. The upper GI endoscopy was                        accomplished with ease. The patient tolerated the                        procedure well. Findings:      The esophagus was normal.      The stomach was normal.      One non-bleeding cratered  duodenal ulcer with a clean ulcer base       (Forrest Class III) was found in the first portion of the duodenum. The       lesion was 8 mm in largest dimension. Biopsies were taken with a cold       forceps for histology. Biopsy taken from edge of the ulcer Impression:           - Normal esophagus.                       - Normal stomach.                       - One non-bleeding duodenal ulcer with a clean ulcer                        base (Forrest Class III). Biopsied. Recommendation:       - Return patient to hospital ward for ongoing care.                       - Advance diet as tolerated today.                       - Use Prilosec (omeprazole) 40 mg PO daily for 6 weeks.                       - Perform an H. pylori stool antigen (HpSA) test [Day].                       - Await pathology results.                       - Return to my office in 2 weeks. Procedure Code(s):    --- Professional ---                       [id number], Esophagogastroduodenoscopy, flexible, transoral;                        with biopsy, single or multiple Diagnosis Code(s):    --- Professional ---                       K26.9, Duodenal ulcer, unspecified as acute or chronic,                        without hemorrhage or perforation                       K92.1, Melena (includes Hematochezia) CPT copyright 2016 American [employer] Association. All rights reserved. The codes documented in this report are preliminary and upon coder review may  be revised to meet current compliance requirements. Bailey Mech  Vicente Males, MD Jonathon Bellows MD, MD 07/12/2016 12:53:34 PM This report has been signed electronically. Number of Addenda: 0 Note Initiated On: 07/12/2016 12:41 PM      [facility]

## 2016-07-12 NOTE — Anesthesia Post-op Follow-up Note (Cosign Needed)
Anesthesia QCDR form completed.        

## 2016-07-13 ENCOUNTER — Encounter: Payer: Self-pay | Admitting: Gastroenterology

## 2016-07-14 LAB — SURGICAL PATHOLOGY

## 2016-07-17 ENCOUNTER — Other Ambulatory Visit: Payer: Self-pay

## 2016-07-18 ENCOUNTER — Other Ambulatory Visit: Payer: Self-pay

## 2016-07-18 ENCOUNTER — Ambulatory Visit (INDEPENDENT_AMBULATORY_CARE_PROVIDER_SITE_OTHER): Payer: Medicare Other | Admitting: Gastroenterology

## 2016-07-18 ENCOUNTER — Other Ambulatory Visit
Admission: RE | Admit: 2016-07-18 | Discharge: 2016-07-18 | Disposition: A | Discharge status: Home / Self Care | Payer: Medicare Other | Source: Ambulatory Visit | Attending: Gastroenterology | Admitting: Gastroenterology

## 2016-07-18 ENCOUNTER — Encounter: Payer: Self-pay | Admitting: Gastroenterology

## 2016-07-18 VITALS — BP 118/62 | Ht 66.0 in | Wt 223.0 lb

## 2016-07-18 DIAGNOSIS — K5900 Constipation, unspecified: Secondary | ICD-10-CM | POA: Insufficient documentation

## 2016-07-18 DIAGNOSIS — K269 Duodenal ulcer, unspecified as acute or chronic, without hemorrhage or perforation: Secondary | ICD-10-CM | POA: Diagnosis not present

## 2016-07-18 DIAGNOSIS — K921 Melena: Secondary | ICD-10-CM

## 2016-07-18 LAB — CBC WITH DIFFERENTIAL/PLATELET
BASOS ABS: 0.1 10*3/uL (ref 0–0.1)
Basophils Relative: 1 %
EOS PCT: 3 %
Eosinophils Absolute: 0.2 10*3/uL (ref 0–0.7)
HCT: 29.4 % — ABNORMAL LOW (ref 40.0–52.0)
Hemoglobin: 10 g/dL — ABNORMAL LOW (ref 13.0–18.0)
Lymphocytes Relative: 18 %
Lymphs Abs: 1.2 10*3/uL (ref 1.0–3.6)
MCH: 32.6 pg (ref 26.0–34.0)
MCHC: 33.9 g/dL (ref 32.0–36.0)
MCV: 96.2 fL (ref 80.0–100.0)
MONO ABS: 0.5 10*3/uL (ref 0.2–1.0)
MONOS PCT: 8 %
Neutro Abs: 4.9 10*3/uL (ref 1.4–6.5)
Neutrophils Relative %: 70 %
PLATELETS: 327 10*3/uL (ref 150–440)
RBC: 3.06 MIL/uL — ABNORMAL LOW (ref 4.40–5.90)
RDW: 14.7 % — AB (ref 11.5–14.5)
WBC: 6.8 10*3/uL (ref 3.8–10.6)

## 2016-07-18 MED ORDER — POLYETHYLENE GLYCOL 3350 17 G PO PACK: 17.0000 g | PACK | Freq: Every day | ORAL | Status: AC

## 2016-07-18 MED ORDER — SUCRALFATE 1 GM/10ML PO SUSP: 1.0000 g | Freq: Four times a day (QID) | ORAL | 1 refills | Status: DC

## 2016-07-18 NOTE — Progress Notes (Signed)
Primary Care Physician: Madelyn Brunner, MD  Primary Gastroenterologist:  Dr. Jonathon Bellows   Chief Complaint  Patient presents with  . Blood In Stools  . Constipation    HPI: Theodore Padilla is a 83 y.o. male here .  He is here today as a hospital follow up. I was consulted to see him on 07/11/16 when he was admitted for a GI bleed with 4 days history of black stools. Drop in Hb from 11.5 to 9.4 . No prior NSAID use. I performed an EGD the next day and there was a large duodenal ulcer with a clean base. I took some biopsies from the edge of the ulcer and it demonstrated gastric metaplasia with villous blunting .  Interval history 07/12/16-07/18/16  No repeat CBC since discharge. H pylori stool antigen not checked.   He was given protonix and took it only twice and says it caused him to get headaches and had difficulty with breathing , got nauseous.   Denies any NSAID's. Last bowel movement was 3 days back , was dark brown but not black .      Current Outpatient Prescriptions  Medication Sig Dispense Refill  . allopurinol (ZYLOPRIM) 100 MG tablet Take 100 mg by mouth daily.    Marland Kitchen aspirin 81 MG tablet Take 81 mg by mouth daily.    . Cyanocobalamin (VITAMIN B 12 PO) Take 1 tablet by mouth daily.    . finasteride (PROSCAR) 5 MG tablet TAKE ONE TABLET BY MOUTH ONCE DAILY 90 tablet 3  . lisinopril-hydrochlorothiazide (PRINZIDE,ZESTORETIC) 10-12.5 MG per tablet Take 1 tablet by mouth daily.    . tamsulosin (FLOMAX) 0.4 MG CAPS capsule Take by mouth.    . docusate sodium (COLACE) 100 MG capsule Take by mouth.    . fluorouracil (EFUDEX) 5 % cream Apply twice daily for 3 weeks to pre-cancer spots on the forehead and right scalp    . furosemide (LASIX) 20 MG tablet Take by mouth.    . magnesium hydroxide (MILK OF MAGNESIA) 400 MG/5ML suspension Take by mouth.    . pantoprazole (PROTONIX) 40 MG tablet Take 1 tablet (40 mg total) by mouth daily. (Patient not taking: Reported on 07/18/2016) 60  tablet 1  . predniSONE (DELTASONE) 10 MG tablet     . triamcinolone cream (KENALOG) 0.1 % Use over affected areas sparingly as needed for the face     No current facility-administered medications for this visit.     Allergies as of 07/18/2016 - Review Complete 07/12/2016  Allergen Reaction Noted  . Ciprofloxacin Other (See Comments) and Shortness Of Breath 09/22/2014  . Penicillins Anaphylaxis and Swelling 01/12/2012  . Melatonin Nausea And Vomiting 03/10/2015  . Rofecoxib Rash and Other (See Comments) 09/22/2014  . Tramadol Nausea And Vomiting 01/12/2012    ROS:  General: Negative for anorexia, weight loss, fever, chills, fatigue, weakness.  ENT: Negative for hoarseness, difficulty swallowing , nasal congestion. CV: Negative for chest pain, angina, palpitations, dyspnea on exertion, peripheral edema.  Respiratory: Negative for dyspnea at rest, dyspnea on exertion, cough, sputum, wheezing.  GI: See history of present illness. GU:  Negative for dysuria, hematuria, urinary incontinence, urinary frequency, nocturnal urination.  Endo: Negative for unusual weight change.    Physical Examination:   BP 118/62   Ht 5\' 6"  (1.676 m)   Wt 223 lb (101.2 kg)   BMI 35.99 kg/m   General: Well-nourished, well-developed in no acute distress. In a wheel chair  Eyes: No icterus. Conjunctivae  pink. Mouth: Oropharyngeal mucosa moist and pink , no lesions erythema or exudate. Lungs: Clear to auscultation bilaterally. Non-labored. Heart: Regular rate and rhythm, no murmurs rubs or gallops.  Abdomen: Bowel sounds are normal, nontender, nondistended, no hepatosplenomegaly or masses, no abdominal bruits or hernia , no rebound or guarding.   Neuro: Alert and oriented x 3.  Grossly intact. Skin: Warm and dry, no jaundice.   Psych: Alert and cooperative, normal mood and affect.  Labs:    Imaging Studies: Dg Abd 1 View  Result Date: 07/11/2016 CLINICAL DATA:  83 year old male with constipation.  Abdominal pain and distention. Initial encounter. EXAM: ABDOMEN - 1 VIEW COMPARISON:  09/02/2014 CT. FINDINGS: Moderate stool rectosigmoid region. Mild stool transverse colon and descending colon. No plain film evidence of small bowel obstruction. The possibility of free intraperitoneal air cannot be assessed on a supine view. Vascular calcifications.  Suggestion of right renal calculi. Scoliosis lumbar spine convex right. Post bilateral hip replacement. IMPRESSION: Moderate stool rectosigmoid region. Mild stool transverse colon and descending colon. No plain film evidence of small bowel obstruction. Electronically Signed   By: Genia Del M.D.   On: 07/11/2016 10:50   Dg Chest Portable 1 View  Result Date: 07/10/2016 CLINICAL DATA:  Shortness of Breath EXAM: PORTABLE CHEST 1 VIEW COMPARISON:  09/11/2013 FINDINGS: Cardiac shadow is mildly enlarged but stable. Aortic calcifications are again seen. Lungs are well aerated bilaterally. Stable nodular density is noted in the left base dating back to 2013 consistent with a benign etiology. No focal infiltrate or sizable effusion is seen. No bony abnormality is noted. IMPRESSION: No acute abnormality seen. Electronically Signed   By: Inez Catalina M.D.   On: 07/10/2016 15:03    Assessment and Plan:   Theodore Padilla 83 y.o. male is here for a hospital follow up. He was admitted 2 weeks back with melena and an EGD showed a large non bleeding duodenal ulcer. Biopsies showed gastric metaplasia and villous blunting .  Plan  1. Celiac serology  2. H pylori stool antigen 3. Continue PPI daily 4. No NSAID's 5. EGD in 8-10 weeks  6. CBC 7. Miralax daily for constipation  8. Samples provided for Dexilant for 2 weeks, daily carafate    Dr Jonathon Bellows  MD

## 2016-07-19 LAB — H PYLORI, IGM, IGG, IGA AB
H PYLORI IGG: 6.79 {index_val} — AB (ref 0.00–0.79)
H. Pylogi, Iga Abs: 16.3 units — ABNORMAL HIGH (ref 0.0–8.9)
H. Pylogi, Igm Abs: <9 units (ref 0.0–8.9)

## 2016-07-19 LAB — MISC LABCORP TEST (SEND OUT): Labcorp test code: 163683

## 2016-07-24 ENCOUNTER — Telehealth: Payer: Self-pay

## 2016-07-24 ENCOUNTER — Other Ambulatory Visit: Payer: Self-pay

## 2016-07-24 DIAGNOSIS — K2971 Gastritis, unspecified, with bleeding: Secondary | ICD-10-CM

## 2016-07-24 NOTE — Telephone Encounter (Signed)
-----   Message from Jonathon Bellows, MD sent at 07/24/2016 10:15 AM EDT ----- For some reason lab done H pylori serology which is not done anymore, need to do H Pylori stool antigen

## 2016-07-24 NOTE — Telephone Encounter (Signed)
Advised patient to pick-up stool testing container at the [facility].   Bring sample back to the [facility] for drop-off.

## 2016-07-25 ENCOUNTER — Other Ambulatory Visit
Admission: RE | Admit: 2016-07-25 | Discharge: 2016-07-25 | Disposition: A | Discharge status: Home / Self Care | Payer: Medicare Other | Source: Ambulatory Visit | Attending: Gastroenterology | Admitting: Gastroenterology

## 2016-07-25 DIAGNOSIS — K2971 Gastritis, unspecified, with bleeding: Secondary | ICD-10-CM | POA: Insufficient documentation

## 2016-07-27 LAB — H. PYLORI ANTIGEN, STOOL: H. PYLORI STOOL AG, EIA: NEGATIVE

## 2016-08-09 ENCOUNTER — Ambulatory Visit: Payer: Medicare Other | Admitting: Gastroenterology

## 2016-08-25 ENCOUNTER — Telehealth: Payer: Self-pay | Admitting: Gastroenterology

## 2016-08-25 NOTE — Telephone Encounter (Signed)
Spoke with Anda Kraft at [facility] required for EGD [phone] / K26.9.

## 2016-08-28 ENCOUNTER — Encounter: Payer: Self-pay | Admitting: *Deleted

## 2016-08-28 ENCOUNTER — Other Ambulatory Visit: Payer: Self-pay | Admitting: Orthopedic Surgery

## 2016-08-28 DIAGNOSIS — R109 Unspecified abdominal pain: Secondary | ICD-10-CM

## 2016-08-29 ENCOUNTER — Ambulatory Visit: Admission: RE | Admit: 2016-08-29 | Payer: Medicare Other | Source: Ambulatory Visit | Admitting: Gastroenterology

## 2016-08-29 ENCOUNTER — Encounter: Admission: RE | Payer: Self-pay | Source: Ambulatory Visit

## 2016-08-29 SURGERY — ESOPHAGOGASTRODUODENOSCOPY (EGD) WITH PROPOFOL
Anesthesia: General

## 2016-08-31 ENCOUNTER — Ambulatory Visit
Admission: RE | Admit: 2016-08-31 | Discharge: 2016-08-31 | Disposition: A | Discharge status: Home / Self Care | Payer: Medicare Other | Source: Ambulatory Visit | Attending: Orthopedic Surgery | Admitting: Orthopedic Surgery

## 2016-08-31 DIAGNOSIS — N281 Cyst of kidney, acquired: Secondary | ICD-10-CM | POA: Insufficient documentation

## 2016-08-31 DIAGNOSIS — R109 Unspecified abdominal pain: Secondary | ICD-10-CM | POA: Insufficient documentation

## 2016-09-18 ENCOUNTER — Other Ambulatory Visit
Admission: RE | Admit: 2016-09-18 | Discharge: 2016-09-18 | Disposition: A | Discharge status: Home / Self Care | Payer: Medicare Other | Source: Ambulatory Visit | Attending: Gastroenterology | Admitting: Gastroenterology

## 2016-09-18 ENCOUNTER — Other Ambulatory Visit: Payer: Self-pay

## 2016-09-18 ENCOUNTER — Encounter: Payer: Self-pay | Admitting: Gastroenterology

## 2016-09-18 ENCOUNTER — Ambulatory Visit (INDEPENDENT_AMBULATORY_CARE_PROVIDER_SITE_OTHER): Payer: Medicare Other | Admitting: Gastroenterology

## 2016-09-18 VITALS — BP 145/79 | HR 66 | Temp 97.6°F | Ht 66.0 in | Wt 215.2 lb

## 2016-09-18 DIAGNOSIS — K269 Duodenal ulcer, unspecified as acute or chronic, without hemorrhage or perforation: Secondary | ICD-10-CM

## 2016-09-18 NOTE — Progress Notes (Signed)
Primary Care Physician: Madelyn Brunner, MD  Primary Gastroenterologist:  Dr. Jonathon Bellows   No chief complaint on file.   HPI: Theodore Padilla is a 83 y.o. male   Summary of history : He is here today for a follow up . I was consulted to see him on 07/11/16 when he was admitted for a GI bleed with 4 days history of black stools. Drop in Hb from 11.5 to 9.4 . No prior NSAID use. I performed an EGD and there was a large duodenal ulcer with a clean base. I took some biopsies from the edge of the ulcer and it demonstrated gastric metaplasia with villous blunting .  Interval history 07/18/16-09/18/2016  H pylori stool antigen negative Hb 07/18/16 -Hb 10 grams  Stool has been "tan colored" not black , he stopped taking the dexilant as" I feel good"  Denies any NSAID's.   Current Outpatient Prescriptions  Medication Sig Dispense Refill  . allopurinol (ZYLOPRIM) 100 MG tablet Take 100 mg by mouth daily.    Marland Kitchen aspirin 81 MG tablet Take 81 mg by mouth daily.    Marland Kitchen aspirin EC 81 MG tablet Take by mouth.    . Cyanocobalamin (VITAMIN B 12 PO) Take 1 tablet by mouth daily.    Marland Kitchen docusate sodium (COLACE) 100 MG capsule Take by mouth.    . erythromycin ophthalmic ointment     . finasteride (PROSCAR) 5 MG tablet TAKE ONE TABLET BY MOUTH ONCE DAILY 90 tablet 3  . fluorouracil (EFUDEX) 5 % cream Apply twice daily for 3 weeks to pre-cancer spots on the forehead and right scalp    . furosemide (LASIX) 20 MG tablet Take by mouth.    Marland Kitchen lisinopril-hydrochlorothiazide (PRINZIDE,ZESTORETIC) 10-12.5 MG per tablet Take 1 tablet by mouth daily.    Marland Kitchen lisinopril-hydrochlorothiazide (PRINZIDE,ZESTORETIC) 10-12.5 MG tablet TAKE ONE TABLET BY MOUTH ONCE DAILY    . magnesium hydroxide (MILK OF MAGNESIA) 400 MG/5ML suspension Take by mouth.    . pantoprazole (PROTONIX) 40 MG tablet     . predniSONE (DELTASONE) 10 MG tablet     . predniSONE (DELTASONE) 50 MG tablet     . sucralfate (CARAFATE) 1 GM/10ML suspension Take  10 mLs (1 g total) by mouth 4 (four) times daily. 420 mL 1  . tamsulosin (FLOMAX) 0.4 MG CAPS capsule Take by mouth.    . triamcinolone cream (KENALOG) 0.1 % Use over affected areas sparingly as needed for the face     Current Facility-Administered Medications  Medication Dose Route Frequency Provider Last Rate Last Dose  . polyethylene glycol (MIRALAX / GLYCOLAX) packet 17 g  17 g Oral Daily Jonathon Bellows, MD        Allergies as of 09/18/2016 - Review Complete 08/28/2016  Allergen Reaction Noted  . Ciprofloxacin Other (See Comments) and Shortness Of Breath 09/22/2014  . Penicillins Anaphylaxis and Swelling 01/12/2012  . Allopurinol Other (See Comments) 08/23/2016  . Melatonin Nausea And Vomiting 03/10/2015  . Pantoprazole Nausea Only 07/19/2016  . Rofecoxib Rash and Other (See Comments) 09/22/2014  . Tramadol Nausea And Vomiting 01/12/2012    ROS:  General: Negative for anorexia, weight loss, fever, chills, fatigue, weakness. ENT: Negative for hoarseness, difficulty swallowing , nasal congestion. CV: Negative for chest pain, angina, palpitations, dyspnea on exertion, peripheral edema.  Respiratory: Negative for dyspnea at rest, dyspnea on exertion, cough, sputum, wheezing.  GI: See history of present illness. GU:  Negative for dysuria, hematuria, urinary incontinence, urinary frequency, nocturnal urination.  Endo: Negative for unusual weight change.    Physical Examination:   There were no vitals taken for this visit.  General: sitting comfortably in his wheel chair  Eyes: No icterus. Conjunctivae pink. Mouth: Oropharyngeal mucosa moist and pink , no lesions erythema or exudate. Lungs: Clear to auscultation bilaterally. Non-labored. Heart: Regular rate and rhythm, no murmurs rubs or gallops.  Abdomen: Bowel sounds are normal, nontender, nondistended, no hepatosplenomegaly or masses, no abdominal bruits or hernia , no rebound or guarding.   Extremities: No lower extremity edema.  No clubbing or deformities. Neuro: Alert and oriented x 3.  Grossly intact. Skin: Warm and dry, no jaundice.   Psych: Alert and cooperative, normal mood and affect. ng Studies: US Renal  Result Date: 09/01/2016 CLINICAL DATA:  Left flank pain. EXAM: RENAL / URINARY TRACT ULTRASOUND COMPLETE COMPARISON:  Abdominal series 07/11/2016.  CT 09/02/2014. FINDINGS: Right Kidney: Length: 10.7 cm. Echogenicity within normal limits. No hydronephrosis visualized. 1.9 cm simple cyst lower pole. Left Kidney: Length: 10.6 cm. Echogenicity within normal limits. No hydronephrosis visualized. Innumerable cysts are again noted with the largest measuring 3.9 cm. 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 This could represent previously identified lesion and interval growth cannot be excluded. Gadolinium-enhanced MRI of the kidneys can be obtained if further evaluation is needed. Bladder: Appears normal for degree of bladder distention. Bilateral ureteral jets noted. Prostate appears prominent. IMPRESSION: 1. Innumerable left renal simple cysts are again noted. Largest measures 3.9 cm in maximum diameter. 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 This could represent previously identified lesion and interval growth cannot be excluded. Gadolinium-enhanced MRI of the kidneys can be obtained if further evaluation needed. 2.  1.9 cm simple cyst right kidney. 3.  Prominent prostate.  No bladder distention.  No hydronephrosis . Electronically Signed   By: Marcello Moores  Register   On: 09/01/2016 07:49    Assessment and Plan:   Theodore Padilla is a 83 y.o. y/o male here  for follow up. He was admitted in 06/2016  melena and an EGD showed a large non bleeding duodenal ulcer. Biopsies  showed gastric metaplasia and villous blunting .He has stopped his PPI but is doing well. He is very reluctant to go on either Zantac or PPI for the future. I did explain that I am not sure why he developed a duodenal ulcer since he was not on any NSAID's and only on a baby asprin. I explained that he should stay on atleast some Zantac while he is on asprin to prevent another Ulcer. He says he will think about it. Blunting of the duodenal villi is not only seen in celiac disease but also in peptic injury. I wish to  repeat EGD to check for healing of the ulcer and check his celiac serology . He is not keen on an EGD at this time and is aware that ulcers may be secondary to neoplasm rarely.   Plan  1. Celiac serology  2.  No NSAID's 3. EGD to check for healing of the ulcer , will revisit in 2-3 months  4. CBC  Dr Jonathon Bellows  MD Follow up in 8-12 weeks

## 2016-09-19 LAB — HEMOGLOBIN A1C
Hgb A1c MFr Bld: 5.8 % — ABNORMAL HIGH (ref 4.8–5.6)
MEAN PLASMA GLUCOSE: 120 mg/dL

## 2016-09-20 LAB — CELIAC DISEASE PANEL
ENDOMYSIAL ANTIBODY IGA: NEGATIVE
IGA: 315 mg/dL (ref 61–437)

## 2016-09-28 ENCOUNTER — Telehealth: Payer: Self-pay

## 2016-09-28 ENCOUNTER — Other Ambulatory Visit: Payer: Self-pay

## 2016-09-28 DIAGNOSIS — K269 Duodenal ulcer, unspecified as acute or chronic, without hemorrhage or perforation: Secondary | ICD-10-CM

## 2016-09-28 NOTE — Telephone Encounter (Signed)
Calling to advise patient of repeat CBC labs due.  Will schedule reminder call.

## 2016-10-02 ENCOUNTER — Telehealth: Payer: Self-pay

## 2016-10-02 ENCOUNTER — Other Ambulatory Visit
Admission: RE | Admit: 2016-10-02 | Discharge: 2016-10-02 | Disposition: A | Discharge status: Home / Self Care | Payer: Medicare Other | Source: Ambulatory Visit | Attending: Gastroenterology | Admitting: Gastroenterology

## 2016-10-02 DIAGNOSIS — K269 Duodenal ulcer, unspecified as acute or chronic, without hemorrhage or perforation: Secondary | ICD-10-CM | POA: Diagnosis present

## 2016-10-02 LAB — CBC WITH DIFFERENTIAL/PLATELET
BASOS PCT: 1 %
Basophils Absolute: 0.1 10*3/uL (ref 0–0.1)
EOS ABS: 0.5 10*3/uL (ref 0–0.7)
EOS PCT: 9 %
HCT: 38.3 % — ABNORMAL LOW (ref 40.0–52.0)
HEMOGLOBIN: 12.7 g/dL — AB (ref 13.0–18.0)
LYMPHS ABS: 1.4 10*3/uL (ref 1.0–3.6)
Lymphocytes Relative: 24 %
MCH: 28.5 pg (ref 26.0–34.0)
MCHC: 33.2 g/dL (ref 32.0–36.0)
MCV: 85.7 fL (ref 80.0–100.0)
MONOS PCT: 8 %
Monocytes Absolute: 0.5 10*3/uL (ref 0.2–1.0)
NEUTROS PCT: 58 %
Neutro Abs: 3.5 10*3/uL (ref 1.4–6.5)
PLATELETS: 266 10*3/uL (ref 150–440)
RBC: 4.47 MIL/uL (ref 4.40–5.90)
RDW: 15.4 % — ABNORMAL HIGH (ref 11.5–14.5)
WBC: 6.1 10*3/uL (ref 3.8–10.6)

## 2016-10-02 NOTE — Telephone Encounter (Signed)
Advised patient its time for his CBC.   Pt states he will go today.

## 2016-12-07 ENCOUNTER — Emergency Department
Admission: EM | Admit: 2016-12-07 | Discharge: 2016-12-07 | Disposition: A | Discharge status: Home / Self Care | Payer: Medicare Other | Attending: Emergency Medicine | Admitting: Emergency Medicine

## 2016-12-07 DIAGNOSIS — Z79899 Other long term (current) drug therapy: Secondary | ICD-10-CM | POA: Insufficient documentation

## 2016-12-07 DIAGNOSIS — Z87891 Personal history of nicotine dependence: Secondary | ICD-10-CM | POA: Insufficient documentation

## 2016-12-07 DIAGNOSIS — I10 Essential (primary) hypertension: Secondary | ICD-10-CM | POA: Insufficient documentation

## 2016-12-07 DIAGNOSIS — Z955 Presence of coronary angioplasty implant and graft: Secondary | ICD-10-CM | POA: Insufficient documentation

## 2016-12-07 DIAGNOSIS — Z7982 Long term (current) use of aspirin: Secondary | ICD-10-CM | POA: Insufficient documentation

## 2016-12-07 DIAGNOSIS — R040 Epistaxis: Secondary | ICD-10-CM | POA: Insufficient documentation

## 2016-12-07 DIAGNOSIS — I2510 Atherosclerotic heart disease of native coronary artery without angina pectoris: Secondary | ICD-10-CM | POA: Insufficient documentation

## 2016-12-07 NOTE — ED Provider Notes (Signed)
[facility] Emergency Department Provider Note  ____________________________________________  Time seen: Approximately 8:02 AM  I have reviewed the triage vital signs and the nursing notes.   HISTORY  Chief Complaint Epistaxis    HPI Theodore Padilla is a 83 y.o. male who complains of bleeding from the right nostril on and off for the past week. Called his doctor who did some outpatient labs which were unremarkable. I reviewed these as well in the electronic medical record. Patient stopped his baby aspirin a few days ago thinking that might be worsening the bleeding. He does report that he uses his fingernail to scratch inside of his nose from time to time. He is also been blowing his nose frequently this week, especially after the bleeding to try and cleared out. He is also been stuffing tissues into the nose which seemed to help stop the bleeding but when he removes the tissues or starts bleeding again. Doesn't take any blood thinners. No trauma. No fevers or chills. Eating and drinking normally. Urinating normally.     Past Medical History:  Diagnosis Date  . Arthritis   . BPH (benign prostatic hyperplasia)   . Chronic constipation   . Coronary artery disease    with stenting x 1  . Gout   . History of kidney stones   . HLD (hyperlipidemia)   . Hypertension   . Shortness of breath    occasional  . Sleep apnea   . Urinary hesitancy   . Urinary retention      Patient Active Problem List   Diagnosis Date Noted  . GI bleed 07/10/2016  . B12 deficiency 02/23/2016  . Microscopic hematuria 03/10/2015  . Left renal mass 03/10/2015  . BPH with obstruction/lower urinary tract symptoms 03/10/2015  . Moderate mitral insufficiency 09/30/2014  . Moderate tricuspid insufficiency 09/30/2014  . Benign essential hypertension 09/25/2014  . Hyperlipidemia, mixed 09/25/2014  . Chronic constipation 03/12/2014  . Chest pain 10/01/2013  . Shortness of breath  10/01/2013  . Adiposity 09/24/2013  . CAD (coronary artery disease), native coronary artery 09/24/2013  . Kidney stone 09/24/2013  . Coronary arteriosclerosis in native artery 09/24/2013  . DJD (degenerative joint disease), lumbar 09/24/2013  . Hyperlipidemia 09/24/2013  . Hypertension 09/24/2013  . Nephrolithiasis 09/24/2013  . Obesity 09/24/2013  . Osteoarthritis of lumbar spine 09/24/2013     Past Surgical History:  Procedure Laterality Date  . APPENDECTOMY    . CARDIAC CATHETERIZATION  2000   with stent  . ESOPHAGOGASTRODUODENOSCOPY (EGD) WITH PROPOFOL N/A 07/12/2016   Procedure: ESOPHAGOGASTRODUODENOSCOPY (EGD) WITH PROPOFOL;  Surgeon: Jonathon Bellows, MD;  Location: [facility] ENDOSCOPY;  Service: Endoscopy;  Laterality: N/A;  . FOREIGN BODY REMOVAL     L knee  . HERNIA REPAIR     umbilicus (below)   . JOINT REPLACEMENT     total hip arthroplasty  . KIDNEY STONE SURGERY    . LITHOTRIPSY    . LUMBAR LAMINECTOMY/DECOMPRESSION MICRODISCECTOMY  01/22/2012   Procedure: LUMBAR LAMINECTOMY/DECOMPRESSION MICRODISCECTOMY 1 LEVEL;  Surgeon: Ophelia Charter, MD;  Location: [facility];  Service: Neurosurgery;  Laterality: N/A;  Lumbar two and lumbar three laminectomies  . TONSILLECTOMY    . TOTAL HIP ARTHROPLASTY     bilat     Prior to Admission medications   Medication Sig Start Date End Date Taking? Authorizing Provider  allopurinol (ZYLOPRIM) 100 MG tablet Take 100 mg by mouth daily.    [provider]  aspirin 81 MG tablet Take 81  mg by mouth daily.    [provider]  aspirin EC 81 MG tablet Take by mouth.    [provider]  Cyanocobalamin (VITAMIN B 12 PO) Take 1 tablet by mouth daily.    [provider]  docusate sodium (COLACE) 100 MG capsule Take by mouth.    [provider]  erythromycin ophthalmic ointment  09/11/16   [provider]  finasteride (PROSCAR) 5 MG tablet TAKE ONE TABLET BY MOUTH ONCE DAILY 10/14/14   Ernestine Conrad,  Larene Beach A, PA-C  fluorouracil (EFUDEX) 5 % cream Apply twice daily for 3 weeks to pre-cancer spots on the forehead and right scalp 06/20/16   [provider]  furosemide (LASIX) 20 MG tablet Take by mouth. 09/23/14   [provider]  lisinopril-hydrochlorothiazide (PRINZIDE,ZESTORETIC) 10-12.5 MG per tablet Take 1 tablet by mouth daily.    [provider]  lisinopril-hydrochlorothiazide (PRINZIDE,ZESTORETIC) 10-12.5 MG tablet TAKE ONE TABLET BY MOUTH ONCE DAILY 08/08/16   [provider]  magnesium hydroxide (MILK OF MAGNESIA) 400 MG/5ML suspension Take by mouth.    [provider]  pantoprazole (PROTONIX) 40 MG tablet  07/13/16   [provider]  predniSONE (DELTASONE) 10 MG tablet  06/12/16   [provider]  predniSONE (DELTASONE) 50 MG tablet  08/17/16   [provider]  sucralfate (CARAFATE) 1 GM/10ML suspension Take 10 mLs (1 g total) by mouth 4 (four) times daily. 07/18/16 09/17/16  Jonathon Bellows, MD  tamsulosin (FLOMAX) 0.4 MG CAPS capsule Take by mouth. 05/10/16 05/10/17  [provider]  triamcinolone cream (KENALOG) 0.1 % Use over affected areas sparingly as needed for the face 09/22/14   [provider]     Allergies Ciprofloxacin; Penicillins; Allopurinol; Melatonin; Pantoprazole; Rofecoxib; and Tramadol   Family History  Problem Relation Age of Onset  . Kidney Stones Unknown   . Kidney disease Neg Hx   . Prostate cancer Neg Hx     Social History Social History  Substance Use Topics  . Smoking status: Former Research scientist (life sciences)  . Smokeless tobacco: Never Used     Comment: quit 1960  . Alcohol use 0.0 oz/week     Comment: occasional wine    Review of Systems  Constitutional:   No fever or chills.  ENT:   No sore throat. No rhinorrhea.Positive nosebleed Cardiovascular:   No chest pain or syncope. Respiratory:   No dyspnea or cough. Gastrointestinal:   Negative for abdominal pain, vomiting and diarrhea.   Musculoskeletal:   Positive swelling around left ankle. All other systems reviewed and are negative except as documented above in ROS and HPI.  ____________________________________________   PHYSICAL EXAM:  VITAL SIGNS: ED Triage Vitals [12/07/16 0622]  Enc Vitals Group     BP      Pulse      Resp      Temp      Temp src      SpO2      Weight 227 lb (103 kg)     Height 5\' 6"  (1.676 m)     Head Circumference      Peak Flow      Pain Score      Pain Loc      Pain Edu?      Excl. in [place]?     Vital signs reviewed, nursing assessments reviewed.   Constitutional:   Alert and oriented. Well appearing and in no distress. Eyes:   No scleral icterus.  EOMI. No nystagmus. No  conjunctival pallor. PERRL. ENT   Head:   Normocephalic and atraumatic.   Nose:  Congestion of left nostril with thick mucus. Right nostril with dried blood in the area of recent bleeding from abraded mucosa on the right medial distal naris, now hemostatic.    Mouth/Throat:   MMM, no pharyngeal erythema. No peritonsillar mass. No blood in oropharynx   Neck:   No meningismus. Full ROM Hematological/Lymphatic/Immunilogical:   No cervical lymphadenopathy. Cardiovascular:   RRR. Symmetric bilateral radial and DP pulses.  No murmurs.  Respiratory:   Normal respiratory effort without tachypnea/retractions. Breath sounds are clear and equal bilaterally. No wheezes/rales/rhonchi. Gastrointestinal:   Soft and nontender. Non distended. There is no CVA tenderness.  No rebound, rigidity, or guarding. Genitourinary:   deferred Musculoskeletal:   Normal range of motion in all extremities. No joint effusions.  No lower extremity tenderness.  Trace bilateral peripheral edema. Neurologic:   Normal speech and language.  Motor grossly intact. No gross focal neurologic deficits are appreciated.  Skin:    Skin is warm, dry and intact. No rash noted.  No petechiae, purpura, or  bullae.  ____________________________________________    LABS (pertinent positives/negatives) (all labs ordered are listed, but only abnormal results are displayed) Labs Reviewed - No data to display ____________________________________________   EKG    ____________________________________________    RADIOLOGY  No results found.  ____________________________________________   PROCEDURES .Epistaxis Management Date/Time: 12/07/2016 8:05 AM Performed by: Joni Fears, Aisling Emigh Authorized by: Carrie Mew   Consent:    Consent obtained:  Verbal   Consent given by:  Patient   Risks discussed:  Pain   Alternatives discussed:  No treatment Procedure details:    Treatment site:  R anterior   Repair method: nose clamp.   Treatment complexity:  Limited   Treatment episode: initial   Post-procedure details:    Assessment:  Bleeding stopped   Patient tolerance of procedure:  Tolerated well, no immediate complications    ____________________________________________   INITIAL IMPRESSION / ASSESSMENT AND PLAN / ED COURSE  Pertinent labs & imaging results that were available during my care of the patient were reviewed by me and considered in my medical decision making (see chart for details).  Patient well appearing no acute distress, presents with anterior epistaxis due to likely abrasion from fingernails and frequent nose blowing. Patient counseled on avoiding this. Counseled to resume his aspirin tomorrow. Follow-up with primary care.      ____________________________________________   FINAL CLINICAL IMPRESSION(S) / ED DIAGNOSES  Final diagnoses:  Right-sided epistaxis  Anterior epistaxis      New Prescriptions   No medications on file     Portions of this note were generated with dragon dictation software. Dictation errors may occur despite best attempts at proofreading.    Carrie Mew, MD 12/07/16 [phone]

## 2016-12-07 NOTE — ED Triage Notes (Addendum)
Pt to room 12 via w/c with no distress noted; reports right nosebleed since 445am; denies any HA or dizziness; st has been having intermittent nosebleeds all week which resolved on its own; called his PCP who ordered outpatient labs but was not seen; st labs were WNL; nose clamp applied

## 2016-12-07 NOTE — ED Notes (Signed)
Pt discharged home after verbalizing understanding of discharge instructions; nad noted. 

## 2016-12-18 ENCOUNTER — Ambulatory Visit: Payer: Medicare Other | Admitting: Gastroenterology

## 2016-12-18 ENCOUNTER — Ambulatory Visit (INDEPENDENT_AMBULATORY_CARE_PROVIDER_SITE_OTHER): Payer: Medicare Other | Admitting: Gastroenterology

## 2016-12-18 ENCOUNTER — Encounter: Payer: Self-pay | Admitting: Gastroenterology

## 2016-12-18 VITALS — BP 129/72 | Temp 97.8°F | Ht 66.0 in | Wt 217.8 lb

## 2016-12-18 DIAGNOSIS — K269 Duodenal ulcer, unspecified as acute or chronic, without hemorrhage or perforation: Secondary | ICD-10-CM | POA: Diagnosis not present

## 2016-12-18 NOTE — Progress Notes (Signed)
Theodore Bellows MD, MRCP(U.K) [address]  [place]  [place], [place] [address]  Main: [phone]  Fax: [phone]   Primary Care Physician: Madelyn Brunner, MD  Primary Gastroenterologist:  Dr. Jonathon Padilla   Chief Complaint  Patient presents with  . Constipation    HPI: Theodore Padilla is a 83 y.o. male   Summary of history : He is here today for a follow up . I was consulted to see him on 07/11/16 when he was admitted for a GI bleed with 4 days history of black stools. Drop in Hb from 11.5 to 9.4 . No prior NSAID use. I performed an EGD and there was a large duodenal ulcer with a clean base. I took some biopsies from the edge of the ulcer and it demonstrated gastric metaplasia with villous blunting .H pylori stool antigen negative  Interval history 09/18/2016 -12/2016  TTG IGA- negative. Hb 09/2016 - Hb12.7   Stool is normal in color. He had some nasal bleeds recently   Denies any NSAID's. Denies use of PPI or Zantac.   Current Outpatient Prescriptions  Medication Sig Dispense Refill  . allopurinol (ZYLOPRIM) 100 MG tablet Take 100 mg by mouth daily.    Marland Kitchen aspirin 81 MG tablet Take 81 mg by mouth daily.    . Cyanocobalamin (VITAMIN B 12 PO) Take 1 tablet by mouth daily.    Marland Kitchen docusate sodium (COLACE) 100 MG capsule Take by mouth.    . erythromycin ophthalmic ointment     . finasteride (PROSCAR) 5 MG tablet TAKE ONE TABLET BY MOUTH ONCE DAILY 90 tablet 3  . fluorouracil (EFUDEX) 5 % cream Apply twice daily for 3 weeks to pre-cancer spots on the forehead and right scalp    . furosemide (LASIX) 20 MG tablet Take by mouth.    Marland Kitchen lisinopril-hydrochlorothiazide (PRINZIDE,ZESTORETIC) 10-12.5 MG per tablet Take 1 tablet by mouth daily.    Marland Kitchen lisinopril-hydrochlorothiazide (PRINZIDE,ZESTORETIC) 10-12.5 MG tablet TAKE ONE TABLET BY MOUTH ONCE DAILY    . magnesium hydroxide (MILK OF MAGNESIA) 400 MG/5ML suspension Take by mouth.    Marland Kitchen aspirin EC 81 MG tablet Take by mouth.    .  pantoprazole (PROTONIX) 40 MG tablet     . predniSONE (DELTASONE) 10 MG tablet     . predniSONE (DELTASONE) 50 MG tablet     . sucralfate (CARAFATE) 1 GM/10ML suspension Take 10 mLs (1 g total) by mouth 4 (four) times daily. 420 mL 1  . tamsulosin (FLOMAX) 0.4 MG CAPS capsule Take by mouth.    . triamcinolone cream (KENALOG) 0.1 % Use over affected areas sparingly as needed for the face     Current Facility-Administered Medications  Medication Dose Route Frequency Provider Last Rate Last Dose  . polyethylene glycol (MIRALAX / GLYCOLAX) packet 17 g  17 g Oral Daily Theodore Bellows, MD        Allergies as of 12/18/2016 - Review Complete 12/18/2016  Allergen Reaction Noted  . Ciprofloxacin Other (See Comments) and Shortness Of Breath 09/22/2014  . Penicillins Anaphylaxis and Swelling 01/12/2012  . Allopurinol Other (See Comments) 08/23/2016  . Melatonin Nausea And Vomiting 03/10/2015  . Pantoprazole Nausea Only 07/19/2016  . Rofecoxib Rash and Other (See Comments) 09/22/2014  . Tramadol Nausea And Vomiting 01/12/2012    ROS:  General: Negative for anorexia, weight loss, fever, chills, fatigue, weakness. ENT: Negative for hoarseness, difficulty swallowing , nasal congestion. CV: Negative for chest pain, angina, palpitations, dyspnea on exertion, peripheral edema.  Respiratory: Negative for dyspnea at rest, dyspnea on exertion, cough, sputum, wheezing.  GI: See history of present illness. GU:  Negative for dysuria, hematuria, urinary incontinence, urinary frequency, nocturnal urination.  Endo: Negative for unusual weight change.    Physical Examination:   BP 129/72   Temp 97.8 F (36.6 C) (Oral)   Ht 5\' 6"  (1.676 m)   Wt 217 lb 12.8 oz (98.8 kg)   BMI 35.15 kg/m   General: Well-nourished, well-developed in no acute distress. In room on his scooter Eyes: No icterus. Conjunctivae pink. Mouth: Oropharyngeal mucosa moist and pink , no lesions erythema or exudate. Lungs: Clear to  auscultation bilaterally. Non-labored. Heart: Regular rate and rhythm, no murmurs rubs or gallops.  Abdomen: Bowel sounds are normal, nontender, nondistended, no hepatosplenomegaly or masses, no abdominal bruits or hernia , no rebound or guarding.   Extremities: No lower extremity edema. No clubbing or deformities. Neuro: Alert and oriented x 3.  Grossly intact. Skin: Warm and dry, no jaundice.   Psych: Alert and cooperative, normal mood and affect.   Imaging Studies: No results found.  Assessment and Plan:   Theodore Padilla is a 83 y.o. y/o male  here  for follow up. He was admitted in 06/2016  melena and an EGD showed a large non bleeding duodenal ulcer. Biopsies showed gastric metaplasia and villous blunting .He is not on PPI or Zantac as he feels he is doing well .  Similar to his last visit he is not keen on an EGD to check for healing of his duodenal ulcer and is aware of the risks of doing so   Plan F/u as needed    Dr Theodore Bellows  MD,MRCP [facility])

## 2017-01-08 ENCOUNTER — Emergency Department
Admission: EM | Admit: 2017-01-08 | Discharge: 2017-01-08 | Disposition: A | Discharge status: Home / Self Care | Payer: Medicare Other | Attending: Emergency Medicine | Admitting: Emergency Medicine

## 2017-01-08 DIAGNOSIS — Z7982 Long term (current) use of aspirin: Secondary | ICD-10-CM | POA: Diagnosis not present

## 2017-01-08 DIAGNOSIS — Z79899 Other long term (current) drug therapy: Secondary | ICD-10-CM | POA: Insufficient documentation

## 2017-01-08 DIAGNOSIS — R040 Epistaxis: Secondary | ICD-10-CM | POA: Insufficient documentation

## 2017-01-08 DIAGNOSIS — I2510 Atherosclerotic heart disease of native coronary artery without angina pectoris: Secondary | ICD-10-CM | POA: Insufficient documentation

## 2017-01-08 DIAGNOSIS — I10 Essential (primary) hypertension: Secondary | ICD-10-CM | POA: Insufficient documentation

## 2017-01-08 DIAGNOSIS — Z87891 Personal history of nicotine dependence: Secondary | ICD-10-CM | POA: Diagnosis not present

## 2017-01-08 NOTE — ED Provider Notes (Signed)
[facility] Emergency Department Provider Note   ____________________________________________   I have reviewed the triage vital signs and the nursing notes.   HISTORY  Chief Complaint Epistaxis    HPI Theodore Padilla is a 83 y.o. male Presents the emergency department with epistaxis of the right nostril. Patient reports this pain the fourth nosebleed since last night. He has been experiencing multiple nosebleeds over the last month he has been able to manage however, he is concerned of a constant occurrence of nosebleeds. Patient does not take blood thinners, aspirin or other medications that thin his blood. Patient has had recent labs that were unremarkable to explain frequent nosebleeds. Patient reports twisting a tissue and occluding his right nostril to stop bleeding.Patient called ENT however he has been unable to get an appointment until October.Patient denies feeling weak, dizzy, presyncopal. He denies any syncopal episodes or recent falls. Patient denies fever, chills, headache, vision changes, chest pain, chest tightness, shortness of breath, abdominal pain, nausea and vomiting.  Past Medical History:  Diagnosis Date  . Arthritis   . BPH (benign prostatic hyperplasia)   . Chronic constipation   . Coronary artery disease    with stenting x 1  . Gout   . History of kidney stones   . HLD (hyperlipidemia)   . Hypertension   . Shortness of breath    occasional  . Sleep apnea   . Urinary hesitancy   . Urinary retention     Patient Active Problem List   Diagnosis Date Noted  . GI bleed 07/10/2016  . B12 deficiency 02/23/2016  . Microscopic hematuria 03/10/2015  . Left renal mass 03/10/2015  . BPH with obstruction/lower urinary tract symptoms 03/10/2015  . Moderate mitral insufficiency 09/30/2014  . Moderate tricuspid insufficiency 09/30/2014  . Benign essential hypertension 09/25/2014  . Hyperlipidemia, mixed 09/25/2014  . Chronic constipation  03/12/2014  . Chest pain 10/01/2013  . Shortness of breath 10/01/2013  . Adiposity 09/24/2013  . CAD (coronary artery disease), native coronary artery 09/24/2013  . Kidney stone 09/24/2013  . Coronary arteriosclerosis in native artery 09/24/2013  . DJD (degenerative joint disease), lumbar 09/24/2013  . Hyperlipidemia 09/24/2013  . Hypertension 09/24/2013  . Nephrolithiasis 09/24/2013  . Obesity 09/24/2013  . Osteoarthritis of lumbar spine 09/24/2013    Past Surgical History:  Procedure Laterality Date  . APPENDECTOMY    . CARDIAC CATHETERIZATION  2000   with stent  . ESOPHAGOGASTRODUODENOSCOPY (EGD) WITH PROPOFOL N/A 07/12/2016   Procedure: ESOPHAGOGASTRODUODENOSCOPY (EGD) WITH PROPOFOL;  Surgeon: Jonathon Bellows, MD;  Location: [facility] ENDOSCOPY;  Service: Endoscopy;  Laterality: N/A;  . FOREIGN BODY REMOVAL     L knee  . HERNIA REPAIR     umbilicus (below)   . JOINT REPLACEMENT     total hip arthroplasty  . KIDNEY STONE SURGERY    . LITHOTRIPSY    . LUMBAR LAMINECTOMY/DECOMPRESSION MICRODISCECTOMY  01/22/2012   Procedure: LUMBAR LAMINECTOMY/DECOMPRESSION MICRODISCECTOMY 1 LEVEL;  Surgeon: Ophelia Charter, MD;  Location: [facility];  Service: Neurosurgery;  Laterality: N/A;  Lumbar two and lumbar three laminectomies  . TONSILLECTOMY    . TOTAL HIP ARTHROPLASTY     bilat    Prior to Admission medications   Medication Sig Start Date End Date Taking? Authorizing Provider  allopurinol (ZYLOPRIM) 100 MG tablet Take 100 mg by mouth daily.    [provider]  aspirin 81 MG tablet Take 81 mg by mouth daily.    [provider]  aspirin EC 81 MG tablet Take by mouth.    [provider]  Cyanocobalamin (VITAMIN B 12 PO) Take 1 tablet by mouth daily.    [provider]  docusate sodium (COLACE) 100 MG capsule Take by mouth.    [provider]  erythromycin ophthalmic ointment  09/11/16   [provider]  finasteride (PROSCAR) 5 MG tablet  TAKE ONE TABLET BY MOUTH ONCE DAILY 10/14/14   Ernestine Conrad, Larene Beach A, PA-C  fluorouracil (EFUDEX) 5 % cream Apply twice daily for 3 weeks to pre-cancer spots on the forehead and right scalp 06/20/16   [provider]  furosemide (LASIX) 20 MG tablet Take by mouth. 09/23/14   [provider]  lisinopril-hydrochlorothiazide (PRINZIDE,ZESTORETIC) 10-12.5 MG per tablet Take 1 tablet by mouth daily.    [provider]  lisinopril-hydrochlorothiazide (PRINZIDE,ZESTORETIC) 10-12.5 MG tablet TAKE ONE TABLET BY MOUTH ONCE DAILY 08/08/16   [provider]  magnesium hydroxide (MILK OF MAGNESIA) 400 MG/5ML suspension Take by mouth.    [provider]  pantoprazole (PROTONIX) 40 MG tablet  07/13/16   [provider]  predniSONE (DELTASONE) 10 MG tablet  06/12/16   [provider]  predniSONE (DELTASONE) 50 MG tablet  08/17/16   [provider]  sucralfate (CARAFATE) 1 GM/10ML suspension Take 10 mLs (1 g total) by mouth 4 (four) times daily. 07/18/16 09/17/16  Jonathon Bellows, MD  tamsulosin (FLOMAX) 0.4 MG CAPS capsule Take by mouth. 05/10/16 05/10/17  [provider]  triamcinolone cream (KENALOG) 0.1 % Use over affected areas sparingly as needed for the face 09/22/14   [provider]    Allergies Ciprofloxacin; Penicillins; Allopurinol; Melatonin; Pantoprazole; Rofecoxib; and Tramadol  Family History  Problem Relation Age of Onset  . Kidney Stones Unknown   . Kidney disease Neg Hx   . Prostate cancer Neg Hx     Social History Social History  Substance Use Topics  . Smoking status: Former Research scientist (life sciences)  . Smokeless tobacco: Never Used     Comment: quit 1960  . Alcohol use 0.0 oz/week     Comment: occasional wine    Review of Systems Constitutional: Negative for fever/chills Eyes: No visual changes. ENT:  Negative for sore throat and for difficulty swallowing. Positive for nosebleed. Cardiovascular: Denies chest  pain. Respiratory: Denies cough. Denies shortness of breath. Gastrointestinal: No abdominal pain.  No nausea, vomiting, diarrhea. Genitourinary: Negative for dysuria. Musculoskeletal: Negative for back pain. Skin: Negative for rash. Neurological: Negative for headaches.  Negative focal weakness or numbness. Negative for loss of consciousness. Able to ambulate. ____________________________________________   PHYSICAL EXAM:  VITAL SIGNS: ED Triage Vitals  Enc Vitals Group     BP 01/08/17 0841 (!) 117/48     Pulse Rate 01/08/17 0841 69     Resp 01/08/17 0841 18     Temp 01/08/17 0841 97.6 F (36.4 C)     Temp Source 01/08/17 0841 Oral     SpO2 01/08/17 0841 96 %     Weight 01/08/17 0843 217 lb (98.4 kg)     Height 01/08/17 0843 5\' 6"  (1.676 m)     Head Circumference --      Peak Flow --      Pain Score --      Pain Loc --      Pain Edu? --      Excl. in [place]? --     Constitutional: Alert and oriented. Well appearing and in no acute distress.  Eyes: Conjunctivae  are normal. PERRL. EOMI  Head: Normocephalic and atraumatic. ENT:      Ears: Canals clear. TMs intact bilaterally.      Nose: Left nostril with mucus. Right nostril with dried blood.Mucosa erythematous and abraded from recent bleeding. Now hemostatic.       Mouth/Throat: Mucous membranes are moist.  Neck:Supple. No thyromegaly. No stridor.  Cardiovascular: Normal rate, regular rhythm. Normal S1 and S2.  Good peripheral circulation. Respiratory: Normal respiratory effort without tachypnea or retractions. Lungs CTAB. No wheezes/rales/rhonchi. Good air entry to the bases with no decreased or absent breath sounds. Hematological/Lymphatic/Immunological: No cervical lymphadenopathy. Cardiovascular: Normal rate, regular rhythm. Normal distal pulses. Gastrointestinal: Bowel sounds 4 quadrants. Soft and nontender to palpation.  Musculoskeletal: Nontender with normal range of motion in all extremities. Neurologic: Normal speech  and language.  Skin:  Skin is warm, dry and intact. No rash noted. Psychiatric: Mood and affect are normal. Speech and behavior are normal. Patient exhibits appropriate insight and judgement.  ____________________________________________   LABS (all labs ordered are listed, but only abnormal results are displayed)  Labs Reviewed - No data to display ____________________________________________  EKG none ____________________________________________  RADIOLOGY none ____________________________________________   PROCEDURES  Procedure(s) performed: none   Critical Care performed: no ____________________________________________   INITIAL IMPRESSION / ASSESSMENT AND PLAN / ED COURSE  Pertinent labs & imaging results that were available during my care of the patient were reviewed by me and considered in my medical decision making (see chart for details).  Patient presents to emergency department with right nostril epistaxis. History and physical exam findings are reassuring no further bleeding is present. Patient provided referral information to follow up with ENT for continued care. During discharge, nursing called ENT to assist with moving up his scheduled appointment Recommend he avoid putting his fingernails or any other objects that can abrade the skin inside the nostril. Advised him to return the emergency bleeding returned and he was unable to stop nostril bleeding. Patient informed of clinical course, understand medical decision-making process, and agree with plan.        ____________________________________________   FINAL CLINICAL IMPRESSION(S) / ED DIAGNOSES  Final diagnoses:  Right-sided epistaxis       NEW MEDICATIONS STARTED DURING THIS VISIT:  Discharge Medication List as of 01/08/2017  9:59 AM       Note:  This document was prepared using Dragon voice recognition software and may include unintentional dictation errors.    Jerolyn Shin,  PA-C 01/08/17 1611    Lavonia Drafts, MD 01/09/17 [phone]

## 2017-01-08 NOTE — ED Notes (Addendum)
See triage note states he has had intermittent nosebleeds for couple of days   Denies any trauma  No bleeding at present

## 2017-01-08 NOTE — ED Triage Notes (Signed)
Pt c/o bleeding from the right nare since last night, pt takes 81mg  but did not take it today, does not take any other blood thinners.

## 2017-01-08 NOTE — Discharge Instructions (Signed)
Call to schedule an appointment with ENT as soon as possible.  If you experience another nosebleed do not hesitate to return to the emergency department.

## 2017-04-09 ENCOUNTER — Other Ambulatory Visit: Payer: Self-pay

## 2017-04-09 ENCOUNTER — Encounter: Payer: Self-pay | Admitting: *Deleted

## 2017-04-17 NOTE — Discharge Instructions (Signed)

## 2017-04-18 ENCOUNTER — Ambulatory Visit: Payer: Medicare Other | Admitting: Anesthesiology

## 2017-04-18 ENCOUNTER — Ambulatory Visit
Admission: RE | Admit: 2017-04-18 | Discharge: 2017-04-18 | Disposition: A | Discharge status: Home / Self Care | Payer: Medicare Other | Source: Ambulatory Visit | Attending: Ophthalmology | Admitting: Ophthalmology

## 2017-04-18 ENCOUNTER — Encounter
Admission: RE | Disposition: A | Discharge status: Home / Self Care | Payer: Self-pay | Source: Ambulatory Visit | Attending: Ophthalmology

## 2017-04-18 DIAGNOSIS — Z880 Allergy status to penicillin: Secondary | ICD-10-CM | POA: Diagnosis not present

## 2017-04-18 DIAGNOSIS — Z888 Allergy status to other drugs, medicaments and biological substances status: Secondary | ICD-10-CM | POA: Diagnosis not present

## 2017-04-18 DIAGNOSIS — M1990 Unspecified osteoarthritis, unspecified site: Secondary | ICD-10-CM | POA: Diagnosis not present

## 2017-04-18 DIAGNOSIS — Z87891 Personal history of nicotine dependence: Secondary | ICD-10-CM | POA: Insufficient documentation

## 2017-04-18 DIAGNOSIS — Z955 Presence of coronary angioplasty implant and graft: Secondary | ICD-10-CM | POA: Diagnosis not present

## 2017-04-18 DIAGNOSIS — Z9889 Other specified postprocedural states: Secondary | ICD-10-CM | POA: Diagnosis not present

## 2017-04-18 DIAGNOSIS — I2510 Atherosclerotic heart disease of native coronary artery without angina pectoris: Secondary | ICD-10-CM | POA: Diagnosis not present

## 2017-04-18 DIAGNOSIS — Z87442 Personal history of urinary calculi: Secondary | ICD-10-CM | POA: Diagnosis not present

## 2017-04-18 DIAGNOSIS — G4730 Sleep apnea, unspecified: Secondary | ICD-10-CM | POA: Diagnosis not present

## 2017-04-18 DIAGNOSIS — H2511 Age-related nuclear cataract, right eye: Secondary | ICD-10-CM | POA: Insufficient documentation

## 2017-04-18 DIAGNOSIS — Z881 Allergy status to other antibiotic agents status: Secondary | ICD-10-CM | POA: Diagnosis not present

## 2017-04-18 DIAGNOSIS — N400 Enlarged prostate without lower urinary tract symptoms: Secondary | ICD-10-CM | POA: Insufficient documentation

## 2017-04-18 DIAGNOSIS — M4800 Spinal stenosis, site unspecified: Secondary | ICD-10-CM | POA: Insufficient documentation

## 2017-04-18 DIAGNOSIS — M109 Gout, unspecified: Secondary | ICD-10-CM | POA: Insufficient documentation

## 2017-04-18 DIAGNOSIS — Z7982 Long term (current) use of aspirin: Secondary | ICD-10-CM | POA: Insufficient documentation

## 2017-04-18 DIAGNOSIS — Z79899 Other long term (current) drug therapy: Secondary | ICD-10-CM | POA: Diagnosis not present

## 2017-04-18 DIAGNOSIS — Z9049 Acquired absence of other specified parts of digestive tract: Secondary | ICD-10-CM | POA: Diagnosis not present

## 2017-04-18 DIAGNOSIS — Z885 Allergy status to narcotic agent status: Secondary | ICD-10-CM | POA: Insufficient documentation

## 2017-04-18 DIAGNOSIS — I10 Essential (primary) hypertension: Secondary | ICD-10-CM | POA: Insufficient documentation

## 2017-04-18 HISTORY — DX: Presence of external hearing-aid: Z97.4

## 2017-04-18 HISTORY — PX: CATARACT EXTRACTION W/PHACO: SHX586

## 2017-04-18 HISTORY — DX: Presence of dental prosthetic device (complete) (partial): Z97.2

## 2017-04-18 SURGERY — PHACOEMULSIFICATION, CATARACT, WITH IOL INSERTION
Anesthesia: General | Laterality: Right | Wound class: Clean

## 2017-04-18 MED ORDER — LACTATED RINGERS IV SOLN: INTRAVENOUS | Status: DC

## 2017-04-18 MED ORDER — POLYMYXIN B-TRIMETHOPRIM 10000-0.1 UNIT/ML-% OP SOLN
1.0000 [drp] | OPHTHALMIC | Status: DC | PRN
  Administered 2017-04-18 (×3): 1 [drp] via OPHTHALMIC

## 2017-04-18 MED ORDER — ACETAMINOPHEN 325 MG PO TABS: 650.0000 mg | ORAL_TABLET | Freq: Once | ORAL | Status: DC | PRN

## 2017-04-18 MED ORDER — CEFUROXIME OPHTHALMIC INJECTION 1 MG/0.1 ML
INJECTION | OPHTHALMIC | Status: DC | PRN
  Administered 2017-04-18: 0.1 mL via INTRACAMERAL

## 2017-04-18 MED ORDER — ONDANSETRON HCL 4 MG/2ML IJ SOLN: 4.0000 mg | Freq: Once | INTRAMUSCULAR | Status: DC | PRN

## 2017-04-18 MED ORDER — EPINEPHRINE PF 1 MG/ML IJ SOLN
INTRAOCULAR | Status: DC | PRN
  Administered 2017-04-18: 98 mL via OPHTHALMIC

## 2017-04-18 MED ORDER — MIDAZOLAM HCL 2 MG/2ML IJ SOLN
INTRAMUSCULAR | Status: DC | PRN
  Administered 2017-04-18: 1 mg via INTRAVENOUS

## 2017-04-18 MED ORDER — LIDOCAINE HCL (PF) 2 % IJ SOLN
INTRAOCULAR | Status: DC | PRN
  Administered 2017-04-18: 1 mL via INTRAMUSCULAR

## 2017-04-18 MED ORDER — ACETAMINOPHEN 160 MG/5ML PO SOLN: 325.0000 mg | ORAL | Status: DC | PRN

## 2017-04-18 MED ORDER — ARMC OPHTHALMIC DILATING DROPS
1.0000 "application " | OPHTHALMIC | Status: DC | PRN
  Administered 2017-04-18 (×3): 1 via OPHTHALMIC

## 2017-04-18 MED ORDER — FENTANYL CITRATE (PF) 100 MCG/2ML IJ SOLN
INTRAMUSCULAR | Status: DC | PRN
  Administered 2017-04-18: 50 ug via INTRAVENOUS

## 2017-04-18 MED ORDER — BRIMONIDINE TARTRATE-TIMOLOL 0.2-0.5 % OP SOLN
OPHTHALMIC | Status: DC | PRN
  Administered 2017-04-18: 1 [drp] via OPHTHALMIC

## 2017-04-18 MED ORDER — NA HYALUR & NA CHOND-NA HYALUR 0.4-0.35 ML IO KIT
PACK | INTRAOCULAR | Status: DC | PRN
  Administered 2017-04-18: 1 mL via INTRAOCULAR

## 2017-04-18 SURGICAL SUPPLY — 25 items
CANNULA ANT/CHMB 27GA (MISCELLANEOUS) ×3 IMPLANT
CARTRIDGE ABBOTT (MISCELLANEOUS) IMPLANT
GLOVE SURG LX 7.5 STRW (GLOVE) ×2
GLOVE SURG LX STRL 7.5 STRW (GLOVE) ×1 IMPLANT
GLOVE SURG TRIUMPH 8.0 PF LTX (GLOVE) ×3 IMPLANT
GOWN STRL REUS W/ TWL LRG LVL3 (GOWN DISPOSABLE) ×2 IMPLANT
GOWN STRL REUS W/TWL LRG LVL3 (GOWN DISPOSABLE) ×4
LENS IOL TECNIS ITEC 22.5 (Intraocular Lens) ×3 IMPLANT
MARKER SKIN DUAL TIP RULER LAB (MISCELLANEOUS) ×3 IMPLANT
NDL RETROBULBAR .5 NSTRL (NEEDLE) IMPLANT
NEEDLE FILTER BLUNT 18X 1/2SAF (NEEDLE) ×2
NEEDLE FILTER BLUNT 18X1 1/2 (NEEDLE) ×1 IMPLANT
PACK CATARACT BRASINGTON (MISCELLANEOUS) ×3 IMPLANT
PACK EYE AFTER SURG (MISCELLANEOUS) ×3 IMPLANT
PACK OPTHALMIC (MISCELLANEOUS) ×3 IMPLANT
RING MALYGIN 7.0 (MISCELLANEOUS) IMPLANT
SUT ETHILON 10-0 CS-B-6CS-B-6 (SUTURE)
SUT VICRYL  9 0 (SUTURE)
SUT VICRYL 9 0 (SUTURE) IMPLANT
SUTURE EHLN 10-0 CS-B-6CS-B-6 (SUTURE) IMPLANT
SYR 3ML LL SCALE MARK (SYRINGE) ×3 IMPLANT
SYR 5ML LL (SYRINGE) ×3 IMPLANT
SYR TB 1ML LUER SLIP (SYRINGE) ×3 IMPLANT
WATER STERILE IRR 250ML POUR (IV SOLUTION) ×3 IMPLANT
WIPE NON LINTING 3.25X3.25 (MISCELLANEOUS) ×3 IMPLANT

## 2017-04-18 NOTE — Op Note (Signed)
LOCATION:  [facility]   PREOPERATIVE DIAGNOSIS:    Nuclear sclerotic cataract right eye. H25.11   POSTOPERATIVE DIAGNOSIS:  Nuclear sclerotic cataract right eye.     PROCEDURE:  Phacoemusification with posterior chamber intraocular lens placement of the right eye   LENS:   Implant Name Type Inv. Item Serial No. Manufacturer Lot No. LRB No. Used  LENS IOL DIOP 22.5 - [id number] Intraocular Lens LENS IOL DIOP 22.5 [id number]  Right 1        ULTRASOUND TIME: 16 % of 1 minutes, 47 seconds.  CDE 16.7   SURGEON:  Wyonia Hough, MD   ANESTHESIA:  Topical with tetracaine drops and 2% Xylocaine jelly, augmented with 1% preservative-free intracameral lidocaine.    COMPLICATIONS:  None.   DESCRIPTION OF PROCEDURE:  The patient was identified in the holding room and transported to the operating room and placed in the supine position under the operating microscope.  The right eye was identified as the operative eye and it was prepped and draped in the usual sterile ophthalmic fashion.   A 1 millimeter clear-corneal paracentesis was made at the 12:00 position.  0.5 ml of preservative-free 1% lidocaine was injected into the anterior chamber. The anterior chamber was filled with Viscoat viscoelastic.  A 2.4 millimeter keratome was used to make a near-clear corneal incision at the 9:00 position.  A curvilinear capsulorrhexis was made with a cystotome and capsulorrhexis forceps.  Balanced salt solution was used to hydrodissect and hydrodelineate the nucleus.   Phacoemulsification was then used in stop and chop fashion to remove the lens nucleus and epinucleus.  The remaining cortex was then removed using the irrigation and aspiration handpiece. Provisc was then placed into the capsular bag to distend it for lens placement.  A lens was then injected into the capsular bag.  The remaining viscoelastic was aspirated.   Wounds were hydrated with balanced salt solution.  The anterior  chamber was inflated to a physiologic pressure with balanced salt solution.  No wound leaks were noted. Cefuroxime 0.1 ml of a 10mg /ml solution was injected into the anterior chamber for a dose of 1 mg of intracameral antibiotic at the completion of the case.   Timolol and Brimonidine drops were applied to the eye.  The patient was taken to the recovery room in stable condition without complications of anesthesia or surgery.   Theodore Padilla 04/18/2017, 11:20 AM

## 2017-04-18 NOTE — H&P (Signed)
The History and Physical notes are on paper, have been signed, and are to be scanned. The patient remains stable and unchanged from the H&P.   Previous H&P reviewed, patient examined, and there are no changes.  Theodore Padilla 04/18/2017 9:52 AM

## 2017-04-18 NOTE — Anesthesia Preprocedure Evaluation (Signed)
Anesthesia Evaluation  Patient identified by MRN, date of birth, ID band Patient awake    History of Anesthesia Complications Negative for: history of anesthetic complications  Airway Mallampati: I  TM Distance: >3 FB Neck ROM: Full    Dental  (+) Lower Dentures, Upper Dentures   Pulmonary sleep apnea , former smoker (quit over 50 years ago),    Pulmonary exam normal breath sounds clear to auscultation       Cardiovascular Exercise Tolerance: Good hypertension, + CAD and + Cardiac Stents (2004)  Normal cardiovascular exam+ Valvular Problems/Murmurs (MR, TR)  Rhythm:Regular Rate:Normal     Neuro/Psych negative neurological ROS     GI/Hepatic negative GI ROS,   Endo/Other  negative endocrine ROS  Renal/GU Renal disease (hx nephrolithiasis)     Musculoskeletal  (+) Arthritis , Osteoarthritis,  Gout    Abdominal   Peds  Hematology negative hematology ROS (+)   Anesthesia Other Findings BPH  Reproductive/Obstetrics                             Anesthesia Physical Anesthesia Plan  ASA: III  Anesthesia Plan: General   Post-op Pain Management:    Induction: Intravenous  PONV Risk Score and Plan: 1  Airway Management Planned: Natural Airway  Additional Equipment:   Intra-op Plan:   Post-operative Plan:   Informed Consent: I have reviewed the patients History and Physical, chart, labs and discussed the procedure including the risks, benefits and alternatives for the proposed anesthesia with the patient or authorized representative who has indicated his/her understanding and acceptance.     Plan Discussed with: CRNA  Anesthesia Plan Comments:         Anesthesia Quick Evaluation

## 2017-04-18 NOTE — Anesthesia Postprocedure Evaluation (Signed)
Anesthesia Post Note  Patient: Theodore Padilla  Procedure(s) Performed: CATARACT EXTRACTION PHACO AND INTRAOCULAR LENS PLACEMENT (IOC) RIGHT (Right )  Patient location during evaluation: PACU Anesthesia Type: General Level of consciousness: awake and alert, oriented and patient cooperative Pain management: pain level controlled Vital Signs Assessment: post-procedure vital signs reviewed and stable Respiratory status: spontaneous breathing, nonlabored ventilation and respiratory function stable Cardiovascular status: blood pressure returned to baseline and stable Postop Assessment: adequate PO intake Anesthetic complications: no    Darrin Nipper

## 2017-04-18 NOTE — Transfer of Care (Signed)
Immediate Anesthesia Transfer of Care Note  Patient: Theodore Padilla  Procedure(s) Performed: CATARACT EXTRACTION PHACO AND INTRAOCULAR LENS PLACEMENT (IOC) RIGHT (Right )  Patient Location: PACU  Anesthesia Type: General  Level of Consciousness: awake, alert  and patient cooperative  Airway and Oxygen Therapy: Patient Spontanous Breathing and Patient connected to supplemental oxygen  Post-op Assessment: Post-op Vital signs reviewed, Patient's Cardiovascular Status Stable, Respiratory Function Stable, Patent Airway and No signs of Nausea or vomiting  Post-op Vital Signs: Reviewed and stable  Complications: No apparent anesthesia complications

## 2017-04-18 NOTE — Anesthesia Procedure Notes (Signed)
Procedure Name: MAC Performed by: Leeroy Lovings, CRNA Pre-anesthesia Checklist: Patient identified, Emergency Drugs available, Suction available, Timeout performed and Patient being monitored Patient Re-evaluated:Patient Re-evaluated prior to induction Oxygen Delivery Method: Nasal cannula Placement Confirmation: positive ETCO2       

## 2017-04-19 ENCOUNTER — Encounter: Payer: Self-pay | Admitting: Ophthalmology

## 2017-05-09 ENCOUNTER — Other Ambulatory Visit: Payer: Self-pay

## 2017-05-09 ENCOUNTER — Encounter: Payer: Self-pay | Admitting: Emergency Medicine

## 2017-05-09 DIAGNOSIS — Z5321 Procedure and treatment not carried out due to patient leaving prior to being seen by health care provider: Secondary | ICD-10-CM | POA: Insufficient documentation

## 2017-05-09 DIAGNOSIS — H9202 Otalgia, left ear: Secondary | ICD-10-CM | POA: Diagnosis not present

## 2017-05-09 NOTE — ED Triage Notes (Signed)
Pt arrived to the ED for complaints of left ear bleeding. Pt reports that he was about to go into the shower when he noticed the bleeding, then decided to come to  The ED. Pt denies any pain or other symptoms. Pt is AOx4 in no apparent distress.

## 2017-05-10 ENCOUNTER — Emergency Department
Admission: EM | Admit: 2017-05-10 | Discharge: 2017-05-10 | Discharge status: Against Medical Advice | Payer: Medicare Other | Attending: Emergency Medicine | Admitting: Emergency Medicine

## 2017-05-10 NOTE — ED Notes (Signed)
Pt sitting in flex wait with no distress noted, pt called to exam room and st does not want to be seen now

## 2017-05-15 DIAGNOSIS — R600 Localized edema: Secondary | ICD-10-CM

## 2017-05-15 DIAGNOSIS — Q7649 Other congenital malformations of spine, not associated with scoliosis: Secondary | ICD-10-CM

## 2017-05-15 HISTORY — DX: Localized edema: R60.0

## 2017-05-15 HISTORY — DX: Other congenital malformations of spine, not associated with scoliosis: Q76.49

## 2017-05-16 ENCOUNTER — Encounter: Payer: Self-pay | Admitting: *Deleted

## 2017-05-16 ENCOUNTER — Other Ambulatory Visit: Payer: Self-pay

## 2017-05-16 NOTE — Discharge Instructions (Signed)

## 2017-05-23 ENCOUNTER — Ambulatory Visit: Payer: Medicare Other | Admitting: Anesthesiology

## 2017-05-23 ENCOUNTER — Encounter
Admission: RE | Disposition: A | Discharge status: Home / Self Care | Payer: Self-pay | Source: Ambulatory Visit | Attending: Ophthalmology

## 2017-05-23 ENCOUNTER — Ambulatory Visit
Admission: RE | Admit: 2017-05-23 | Discharge: 2017-05-23 | Disposition: A | Discharge status: Home / Self Care | Payer: Medicare Other | Source: Ambulatory Visit | Attending: Ophthalmology | Admitting: Ophthalmology

## 2017-05-23 DIAGNOSIS — M109 Gout, unspecified: Secondary | ICD-10-CM | POA: Diagnosis not present

## 2017-05-23 DIAGNOSIS — H2512 Age-related nuclear cataract, left eye: Secondary | ICD-10-CM | POA: Insufficient documentation

## 2017-05-23 DIAGNOSIS — M1990 Unspecified osteoarthritis, unspecified site: Secondary | ICD-10-CM | POA: Insufficient documentation

## 2017-05-23 DIAGNOSIS — I10 Essential (primary) hypertension: Secondary | ICD-10-CM | POA: Diagnosis not present

## 2017-05-23 DIAGNOSIS — Z87891 Personal history of nicotine dependence: Secondary | ICD-10-CM | POA: Diagnosis not present

## 2017-05-23 DIAGNOSIS — N400 Enlarged prostate without lower urinary tract symptoms: Secondary | ICD-10-CM | POA: Insufficient documentation

## 2017-05-23 DIAGNOSIS — G4730 Sleep apnea, unspecified: Secondary | ICD-10-CM | POA: Insufficient documentation

## 2017-05-23 DIAGNOSIS — Z955 Presence of coronary angioplasty implant and graft: Secondary | ICD-10-CM | POA: Insufficient documentation

## 2017-05-23 DIAGNOSIS — I2510 Atherosclerotic heart disease of native coronary artery without angina pectoris: Secondary | ICD-10-CM | POA: Insufficient documentation

## 2017-05-23 HISTORY — PX: CATARACT EXTRACTION W/PHACO: SHX586

## 2017-05-23 SURGERY — PHACOEMULSIFICATION, CATARACT, WITH IOL INSERTION
Anesthesia: General | Site: Eye | Laterality: Left | Wound class: Clean

## 2017-05-23 MED ORDER — MEPERIDINE HCL 25 MG/ML IJ SOLN
6.2500 mg | INTRAMUSCULAR | Status: DC | PRN
Start: 2017-05-23 — End: 2017-05-23

## 2017-05-23 MED ORDER — LACTATED RINGERS IV SOLN: INTRAVENOUS | Status: DC

## 2017-05-23 MED ORDER — EPINEPHRINE PF 1 MG/ML IJ SOLN
INTRAOCULAR | Status: DC | PRN
  Administered 2017-05-23: 55 mL via OPHTHALMIC

## 2017-05-23 MED ORDER — PROMETHAZINE HCL 25 MG/ML IJ SOLN: 6.2500 mg | INTRAMUSCULAR | Status: DC | PRN

## 2017-05-23 MED ORDER — OXYCODONE HCL 5 MG PO TABS: 5.0000 mg | ORAL_TABLET | Freq: Once | ORAL | Status: DC | PRN

## 2017-05-23 MED ORDER — BRIMONIDINE TARTRATE-TIMOLOL 0.2-0.5 % OP SOLN
OPHTHALMIC | Status: DC | PRN
  Administered 2017-05-23: 1 [drp] via OPHTHALMIC

## 2017-05-23 MED ORDER — CEFUROXIME OPHTHALMIC INJECTION 1 MG/0.1 ML
INJECTION | OPHTHALMIC | Status: DC | PRN
  Administered 2017-05-23: 0.1 mL via INTRACAMERAL

## 2017-05-23 MED ORDER — MIDAZOLAM HCL 2 MG/2ML IJ SOLN
INTRAMUSCULAR | Status: DC | PRN
  Administered 2017-05-23: 1 mg via INTRAVENOUS

## 2017-05-23 MED ORDER — POLYMYXIN B-TRIMETHOPRIM 10000-0.1 UNIT/ML-% OP SOLN
1.0000 [drp] | OPHTHALMIC | Status: DC | PRN
  Administered 2017-05-23 (×3): 1 [drp] via OPHTHALMIC

## 2017-05-23 MED ORDER — ARMC OPHTHALMIC DILATING DROPS
1.0000 "application " | OPHTHALMIC | Status: DC | PRN
  Administered 2017-05-23 (×3): 1 via OPHTHALMIC

## 2017-05-23 MED ORDER — FENTANYL CITRATE (PF) 100 MCG/2ML IJ SOLN
25.0000 ug | INTRAMUSCULAR | Status: DC | PRN
  Administered 2017-05-23: 50 ug via INTRAVENOUS

## 2017-05-23 MED ORDER — LIDOCAINE HCL (PF) 2 % IJ SOLN
INTRAOCULAR | Status: DC | PRN
  Administered 2017-05-23: 1 mL

## 2017-05-23 MED ORDER — OXYCODONE HCL 5 MG/5ML PO SOLN: 5.0000 mg | Freq: Once | ORAL | Status: DC | PRN

## 2017-05-23 MED ORDER — NA HYALUR & NA CHOND-NA HYALUR 0.4-0.35 ML IO KIT
PACK | INTRAOCULAR | Status: DC | PRN
  Administered 2017-05-23: 1 mL via INTRAOCULAR

## 2017-05-23 SURGICAL SUPPLY — 25 items
CANNULA ANT/CHMB 27GA (MISCELLANEOUS) ×3 IMPLANT
CARTRIDGE ABBOTT (MISCELLANEOUS) IMPLANT
GLOVE SURG LX 7.5 STRW (GLOVE) ×4
GLOVE SURG LX STRL 7.5 STRW (GLOVE) ×2 IMPLANT
GLOVE SURG TRIUMPH 8.0 PF LTX (GLOVE) ×3 IMPLANT
GOWN STRL REUS W/ TWL LRG LVL3 (GOWN DISPOSABLE) ×2 IMPLANT
GOWN STRL REUS W/TWL LRG LVL3 (GOWN DISPOSABLE) ×4
LENS IOL TECNIS ITEC 22.0 (Intraocular Lens) ×3 IMPLANT
MARKER SKIN DUAL TIP RULER LAB (MISCELLANEOUS) ×3 IMPLANT
NDL RETROBULBAR .5 NSTRL (NEEDLE) IMPLANT
NEEDLE FILTER BLUNT 18X 1/2SAF (NEEDLE) ×2
NEEDLE FILTER BLUNT 18X1 1/2 (NEEDLE) ×1 IMPLANT
PACK CATARACT BRASINGTON (MISCELLANEOUS) ×3 IMPLANT
PACK EYE AFTER SURG (MISCELLANEOUS) ×3 IMPLANT
PACK OPTHALMIC (MISCELLANEOUS) ×3 IMPLANT
RING MALYGIN 7.0 (MISCELLANEOUS) IMPLANT
SUT ETHILON 10-0 CS-B-6CS-B-6 (SUTURE)
SUT VICRYL  9 0 (SUTURE)
SUT VICRYL 9 0 (SUTURE) IMPLANT
SUTURE EHLN 10-0 CS-B-6CS-B-6 (SUTURE) IMPLANT
SYR 3ML LL SCALE MARK (SYRINGE) ×3 IMPLANT
SYR 5ML LL (SYRINGE) ×3 IMPLANT
SYR TB 1ML LUER SLIP (SYRINGE) ×3 IMPLANT
WATER STERILE IRR 250ML POUR (IV SOLUTION) ×3 IMPLANT
WIPE NON LINTING 3.25X3.25 (MISCELLANEOUS) ×3 IMPLANT

## 2017-05-23 NOTE — Op Note (Signed)
OPERATIVE NOTE  Theodore Padilla [id number] 05/23/2017   PREOPERATIVE DIAGNOSIS:  Nuclear sclerotic cataract left eye. H25.12   POSTOPERATIVE DIAGNOSIS:    Nuclear sclerotic cataract left eye.     PROCEDURE:  Phacoemusification with posterior chamber intraocular lens placement of the left eye   LENS:   Implant Name Type Inv. Item Serial No. Manufacturer Lot No. LRB No. Used  LENS IOL DIOP 22.0 - [id number] Intraocular Lens LENS IOL DIOP 22.0 [id number] AMO  Left 1        ULTRASOUND TIME: 13  % of 1 minutes 24 seconds, CDE 11.1  SURGEON:  Wyonia Hough, MD   ANESTHESIA:  Topical with tetracaine drops and 2% Xylocaine jelly, augmented with 1% preservative-free intracameral lidocaine.    COMPLICATIONS:  None.   DESCRIPTION OF PROCEDURE:  The patient was identified in the holding room and transported to the operating room and placed in the supine position under the operating microscope.  The left eye was identified as the operative eye and it was prepped and draped in the usual sterile ophthalmic fashion.   A 1 millimeter clear-corneal paracentesis was made at the 1:30 position.  0.5 ml of preservative-free 1% lidocaine was injected into the anterior chamber.  The anterior chamber was filled with Viscoat viscoelastic.  A 2.4 millimeter keratome was used to make a near-clear corneal incision at the 10:30 position.  .  A curvilinear capsulorrhexis was made with a cystotome and capsulorrhexis forceps.  Balanced salt solution was used to hydrodissect and hydrodelineate the nucleus.   Phacoemulsification was then used in stop and chop fashion to remove the lens nucleus and epinucleus.  The remaining cortex was then removed using the irrigation and aspiration handpiece. Provisc was then placed into the capsular bag to distend it for lens placement.  A lens was then injected into the capsular bag.  The remaining viscoelastic was aspirated.   Wounds were hydrated with balanced salt solution.   The anterior chamber was inflated to a physiologic pressure with balanced salt solution.  No wound leaks were noted. Cefuroxime 0.1 ml of a 10mg /ml solution was injected into the anterior chamber for a dose of 1 mg of intracameral antibiotic at the completion of the case.   Timolol and Brimonidine drops were applied to the eye.  The patient was taken to the recovery room in stable condition without complications of anesthesia or surgery.  [name] 05/23/2017, 11:33 AM

## 2017-05-23 NOTE — Anesthesia Postprocedure Evaluation (Signed)
Anesthesia Post Note  Patient: Theodore Padilla  Procedure(s) Performed: CATARACT EXTRACTION PHACO AND INTRAOCULAR LENS PLACEMENT (IOC) LEFT (Left Eye)  Patient location during evaluation: PACU Anesthesia Type: General Level of consciousness: awake and alert Pain management: pain level controlled Vital Signs Assessment: post-procedure vital signs reviewed and stable Respiratory status: spontaneous breathing, nonlabored ventilation, respiratory function stable and patient connected to nasal cannula oxygen Cardiovascular status: blood pressure returned to baseline and stable Postop Assessment: no apparent nausea or vomiting Anesthetic complications: no    SCOURAS, NICOLE ELAINE

## 2017-05-23 NOTE — Transfer of Care (Signed)
Immediate Anesthesia Transfer of Care Note  Patient: Theodore Padilla  Procedure(s) Performed: CATARACT EXTRACTION PHACO AND INTRAOCULAR LENS PLACEMENT (IOC) LEFT (Left Eye)  Patient Location: PACU  Anesthesia Type: General  Level of Consciousness: awake, alert  and patient cooperative  Airway and Oxygen Therapy: Patient Spontanous Breathing and Patient connected to supplemental oxygen  Post-op Assessment: Post-op Vital signs reviewed, Patient's Cardiovascular Status Stable, Respiratory Function Stable, Patent Airway and No signs of Nausea or vomiting  Post-op Vital Signs: Reviewed and stable  Complications: No apparent anesthesia complications

## 2017-05-23 NOTE — Anesthesia Preprocedure Evaluation (Signed)
Anesthesia Evaluation  Patient identified by MRN, date of birth, ID band Patient awake    History of Anesthesia Complications Negative for: history of anesthetic complications  Airway Mallampati: I  TM Distance: >3 FB Neck ROM: Full    Dental  (+) Lower Dentures, Upper Dentures   Pulmonary sleep apnea , former smoker,    Pulmonary exam normal breath sounds clear to auscultation       Cardiovascular Exercise Tolerance: Good hypertension, + CAD and + Cardiac Stents (2004)  Normal cardiovascular exam+ Valvular Problems/Murmurs (MR, TR)  Rhythm:Regular Rate:Normal     Neuro/Psych negative neurological ROS     GI/Hepatic negative GI ROS,   Endo/Other  negative endocrine ROS  Renal/GU Renal disease (hx nephrolithiasis)     Musculoskeletal  (+) Arthritis , Osteoarthritis,  Gout    Abdominal   Peds  Hematology negative hematology ROS (+)   Anesthesia Other Findings BPH  Reproductive/Obstetrics                             Anesthesia Physical  Anesthesia Plan  ASA: III  Anesthesia Plan: General   Post-op Pain Management:    Induction: Intravenous  PONV Risk Score and Plan: 1  Airway Management Planned: Natural Airway  Additional Equipment:   Intra-op Plan:   Post-operative Plan:   Informed Consent: I have reviewed the patients History and Physical, chart, labs and discussed the procedure including the risks, benefits and alternatives for the proposed anesthesia with the patient or authorized representative who has indicated his/her understanding and acceptance.     Plan Discussed with: CRNA  Anesthesia Plan Comments:         Anesthesia Quick Evaluation

## 2017-05-23 NOTE — H&P (Signed)
The History and Physical notes are on paper, have been signed, and are to be scanned. The patient remains stable and unchanged from the H&P.   Previous H&P reviewed, patient examined, and there are no changes.  Theodore Padilla 05/23/2017 10:21 AM

## 2017-05-23 NOTE — Anesthesia Procedure Notes (Signed)
Procedure Name: MAC Date/Time: 05/23/2017 11:17 AM Performed by: Janna Arch, CRNA Pre-anesthesia Checklist: Patient identified, Emergency Drugs available, Suction available and Patient being monitored Patient Re-evaluated:Patient Re-evaluated prior to induction Oxygen Delivery Method: Nasal cannula

## 2017-06-29 ENCOUNTER — Encounter: Payer: Self-pay | Admitting: Emergency Medicine

## 2017-06-29 ENCOUNTER — Emergency Department
Admission: EM | Admit: 2017-06-29 | Discharge: 2017-06-29 | Disposition: A | Discharge status: Home / Self Care | Payer: Medicare Other | Attending: Emergency Medicine | Admitting: Emergency Medicine

## 2017-06-29 ENCOUNTER — Other Ambulatory Visit: Payer: Self-pay

## 2017-06-29 DIAGNOSIS — Z87891 Personal history of nicotine dependence: Secondary | ICD-10-CM | POA: Insufficient documentation

## 2017-06-29 DIAGNOSIS — Z7982 Long term (current) use of aspirin: Secondary | ICD-10-CM | POA: Diagnosis not present

## 2017-06-29 DIAGNOSIS — I10 Essential (primary) hypertension: Secondary | ICD-10-CM | POA: Insufficient documentation

## 2017-06-29 DIAGNOSIS — E785 Hyperlipidemia, unspecified: Secondary | ICD-10-CM | POA: Insufficient documentation

## 2017-06-29 DIAGNOSIS — I2510 Atherosclerotic heart disease of native coronary artery without angina pectoris: Secondary | ICD-10-CM | POA: Diagnosis not present

## 2017-06-29 DIAGNOSIS — Z79899 Other long term (current) drug therapy: Secondary | ICD-10-CM | POA: Diagnosis not present

## 2017-06-29 DIAGNOSIS — R339 Retention of urine, unspecified: Secondary | ICD-10-CM | POA: Insufficient documentation

## 2017-06-29 DIAGNOSIS — R319 Hematuria, unspecified: Secondary | ICD-10-CM | POA: Insufficient documentation

## 2017-06-29 LAB — CBC
HCT: 38.2 % — ABNORMAL LOW (ref 40.0–52.0)
Hemoglobin: 12.7 g/dL — ABNORMAL LOW (ref 13.0–18.0)
MCH: 31 pg (ref 26.0–34.0)
MCHC: 33.1 g/dL (ref 32.0–36.0)
MCV: 93.7 fL (ref 80.0–100.0)
PLATELETS: 235 10*3/uL (ref 150–440)
RBC: 4.08 MIL/uL — AB (ref 4.40–5.90)
RDW: 13.7 % (ref 11.5–14.5)
WBC: 7.4 10*3/uL (ref 3.8–10.6)

## 2017-06-29 LAB — COMPREHENSIVE METABOLIC PANEL
ALK PHOS: 68 U/L (ref 38–126)
ALT: 11 U/L — AB (ref 17–63)
AST: 20 U/L (ref 15–41)
Albumin: 3.5 g/dL (ref 3.5–5.0)
Anion gap: 9 (ref 5–15)
BUN: 37 mg/dL — AB (ref 6–20)
CALCIUM: 8.6 mg/dL — AB (ref 8.9–10.3)
CO2: 23 mmol/L (ref 22–32)
CREATININE: 1.33 mg/dL — AB (ref 0.61–1.24)
Chloride: 105 mmol/L (ref 101–111)
GFR calc non Af Amer: 44 mL/min — ABNORMAL LOW (ref >60)
GFR, EST AFRICAN AMERICAN: 51 mL/min — AB (ref >60)
GLUCOSE: 137 mg/dL — AB (ref 65–99)
Potassium: 5.1 mmol/L (ref 3.5–5.1)
Sodium: 137 mmol/L (ref 135–145)
Total Bilirubin: 0.8 mg/dL (ref 0.3–1.2)
Total Protein: 7.4 g/dL (ref 6.5–8.1)

## 2017-06-29 LAB — URINALYSIS, COMPLETE (UACMP) WITH MICROSCOPIC
BACTERIA UA: NONE SEEN
Bilirubin Urine: NEGATIVE
GLUCOSE, UA: NEGATIVE mg/dL
Ketones, ur: NEGATIVE mg/dL
Leukocytes, UA: NEGATIVE
Nitrite: NEGATIVE
PH: 5 (ref 5.0–8.0)
PROTEIN: 30 mg/dL — AB
SPECIFIC GRAVITY, URINE: 1.02 (ref 1.005–1.030)

## 2017-06-29 LAB — LIPASE, BLOOD: Lipase: 24 U/L (ref 11–51)

## 2017-06-29 NOTE — ED Triage Notes (Signed)
Pt to ed with c/o urinary retention and no bm x 3 days.  Pt with c/o abd pain severe, 10/10.  Bladder scan done at triage.  Pt states no urine output x 2 days.

## 2017-06-29 NOTE — ED Notes (Signed)
Bladder scan done, noted 852ml in bladder.

## 2017-06-29 NOTE — ED Notes (Signed)
Pt alert and oriented X4, active, cooperative, pt in NAD. RR even and unlabored, color WNL.  Pt left with foley in place, leg bag in place and instructed on use. Left with all of belongings including cane and glasses.

## 2017-06-29 NOTE — ED Notes (Signed)
Urine output in foley collection bag, clear. No more pink tinged output.

## 2017-06-29 NOTE — ED Provider Notes (Signed)
[facility] Emergency Department Provider Note  ____________________________________________   First MD Initiated Contact with Patient 06/29/17 1054     (approximate)  I have reviewed the triage vital signs and the nursing notes.   HISTORY  Chief Complaint Urinary Retention and Constipation   HPI Theodore Padilla is a 84 y.o. male with a history of enlarged prostate and chronic constipation was presented to the emergency department lower abdominal pain and difficulty urinating.  He has had a Foley placed before the past but does not know the exact indication.  Says that he also has not a bowel movement in 2 days but sometimes will not have a bowel movement for up to 3-4 days and he is to take magnesium citrate.  He says that he took magnesium citrate this morning and had a small bowel movement but is still feeling pressure in his rectum.  Past Medical History:  Diagnosis Date  . Arthritis   . BPH (benign prostatic hyperplasia)   . Chronic constipation   . Coronary artery disease    with stenting x 1  . Gout   . Hearing aid worn    bilateral  . History of kidney stones   . HLD (hyperlipidemia)   . Hypertension   . Shortness of breath    occasional  . Sleep apnea    no CPAP, pt denies OSA  . Urinary hesitancy   . Urinary retention   . Wears dentures    partial upper and lower    Patient Active Problem List   Diagnosis Date Noted  . GI bleed 07/10/2016  . B12 deficiency 02/23/2016  . Microscopic hematuria 03/10/2015  . Left renal mass 03/10/2015  . BPH with obstruction/lower urinary tract symptoms 03/10/2015  . Moderate mitral insufficiency 09/30/2014  . Moderate tricuspid insufficiency 09/30/2014  . Benign essential hypertension 09/25/2014  . Hyperlipidemia, mixed 09/25/2014  . Chronic constipation 03/12/2014  . Chest pain 10/01/2013  . Shortness of breath 10/01/2013  . Adiposity 09/24/2013  . CAD (coronary artery disease), native coronary  artery 09/24/2013  . Kidney stone 09/24/2013  . Coronary arteriosclerosis in native artery 09/24/2013  . DJD (degenerative joint disease), lumbar 09/24/2013  . Hyperlipidemia 09/24/2013  . Hypertension 09/24/2013  . Nephrolithiasis 09/24/2013  . Obesity 09/24/2013  . Osteoarthritis of lumbar spine 09/24/2013    Past Surgical History:  Procedure Laterality Date  . APPENDECTOMY    . CARDIAC CATHETERIZATION  2000   with stent  . CATARACT EXTRACTION W/PHACO Right 04/18/2017   Procedure: CATARACT EXTRACTION PHACO AND INTRAOCULAR LENS PLACEMENT ([place]) RIGHT;  Surgeon: Leandrew Koyanagi, MD;  Location: [place];  Service: Ophthalmology;  Laterality: Right;  . CATARACT EXTRACTION W/PHACO Left 05/23/2017   Procedure: CATARACT EXTRACTION PHACO AND INTRAOCULAR LENS PLACEMENT ([place]) LEFT;  Surgeon: Leandrew Koyanagi, MD;  Location: [place];  Service: Ophthalmology;  Laterality: Left;  . ESOPHAGOGASTRODUODENOSCOPY (EGD) WITH PROPOFOL N/A 07/12/2016   Procedure: ESOPHAGOGASTRODUODENOSCOPY (EGD) WITH PROPOFOL;  Surgeon: Jonathon Bellows, MD;  Location: [facility] END[facility]COPY;  Service: Endoscopy;  Laterality: N/A;  . FOREIGN BODY REMOVAL     L knee  . HERNIA REPAIR     umbilicus (below)   . JOINT REPLACEMENT     total hip arthroplasty  . KIDNEY STONE SURGERY    . LITHOTRIPSY    . LUMBAR LAMINECTOMY/DECOMPRESSION MICRODISCECTOMY  01/22/2012   Procedure: LUMBAR LAMINECTOMY/DECOMPRESSION MICRODISCECTOMY 1 LEVEL;  Surgeon: Ophelia Charter, MD;  Location: [place] [facility];  Service: Neurosurgery;  Laterality:  N/A;  Lumbar two and lumbar three laminectomies  . TONSILLECTOMY    . TOTAL HIP ARTHROPLASTY     bilat    Prior to Admission medications   Medication Sig Start Date End Date Taking? Authorizing Provider  aspirin EC 81 MG tablet Take by mouth.   Yes [provider]  Cyanocobalamin (VITAMIN B 12 PO) Take 1 tablet by mouth daily.   Yes [provider]  docusate sodium  (COLACE) 100 MG capsule Take by mouth daily as needed.    Yes [provider]  fluorouracil (EFUDEX) 5 % cream Apply twice daily for 3 weeks to pre-cancer spots on the forehead and right scalp 06/20/16  Yes [provider]  lisinopril-hydrochlorothiazide (PRINZIDE,ZESTORETIC) 10-12.5 MG tablet TAKE ONE TABLET BY MOUTH ONCE DAILY 08/08/16  Yes [provider]  magnesium hydroxide (MILK OF MAGNESIA) 400 MG/5ML suspension Take by mouth daily as needed.    Yes [provider]  Misc Natural Products (TART CHERRY ADVANCED PO) Take by mouth daily.   Yes [provider]  tamsulosin (FLOMAX) 0.4 MG CAPS capsule Take 0.4 mg by mouth daily.   Yes [provider]  triamcinolone cream (KENALOG) 0.1 % Use over affected areas sparingly as needed for the face 09/22/14  Yes [provider]  allopurinol (ZYLOPRIM) 100 MG tablet Take 100 mg by mouth daily as needed.     [provider]  finasteride (PROSCAR) 5 MG tablet TAKE ONE TABLET BY MOUTH ONCE DAILY Patient not taking: Reported on 05/23/2017 10/14/14   Zara Council A, PA-C    Allergies Ciprofloxacin; Penicillins; Allopurinol; Melatonin; Pantoprazole; Rofecoxib; and Tramadol  Family History  Problem Relation Age of Onset  . Kidney Stones Unknown   . Kidney disease Neg Hx   . Prostate cancer Neg Hx     Social History Social History   Tobacco Use  . Smoking status: Former Research scientist (life sciences)  . Smokeless tobacco: Never Used  . Tobacco comment: quit 1960  Substance Use Topics  . Alcohol use: Yes    Alcohol/week: 0.0 oz    Comment: occasional wine  . Drug use: No    Review of Systems  Constitutional: No fever/chills Eyes: No visual changes. ENT: No sore throat. Cardiovascular: Denies chest pain. Respiratory: Denies shortness of breath. Gastrointestinal:  No nausea, no vomiting.  No diarrhea. Genitourinary: As above  musculoskeletal: Negative for back pain. Skin: Negative for  rash. Neurological: Negative for headaches, focal weakness or numbness.   ____________________________________________   PHYSICAL EXAM:  VITAL SIGNS: ED Triage Vitals  Enc Vitals Group     BP 06/29/17 0921 105/65     Pulse Rate 06/29/17 0921 74     Resp 06/29/17 0921 20     Temp 06/29/17 0921 (!) 97.4 F (36.3 C)     Temp Source 06/29/17 0921 Oral     SpO2 06/29/17 0921 100 %     Weight 06/29/17 0922 217 lb (98.4 kg)     Height 06/29/17 0922 5\' 6"  (1.676 m)     Head Circumference --      Peak Flow --      Pain Score 06/29/17 0926 10     Pain Loc --      Pain Edu? --      Excl. in [place]? --     Constitutional: Alert and oriented. Well appearing and in no acute distress. Eyes: Conjunctivae are normal.  Head: Atraumatic. Nose: No congestion/rhinnorhea. Mouth/Throat: Mucous membranes are moist.  Neck: No stridor.  Cardiovascular: Normal rate, regular rhythm. Grossly normal heart sounds.  Good peripheral circulation. Respiratory: Normal respiratory effort.  No retractions. Lungs CTAB. Gastrointestinal: Soft and nontender. No distention. No CVA tenderness.  Digital rectal exam without fecal impaction.  Small amount of brown stool on the glove.  Foley bag at the bedside.  Patient had instantaneous return of 800 cc.  However, now the urine is bloody.  There are no clots visualized.  Musculoskeletal: No lower extremity tenderness nor edema.  No joint effusions. Neurologic:  Normal speech and language. No gross focal neurologic deficits are appreciated. Skin:  Skin is warm, dry and intact. No rash noted. Psychiatric: Mood and affect are normal. Speech and behavior are normal.  ____________________________________________   LABS (all labs ordered are listed, but only abnormal results are displayed)  Labs Reviewed  COMPREHENSIVE METABOLIC PANEL - Abnormal; Notable for the following components:      Result Value   Glucose, Bld 137 (*)    BUN 37 (*)    Creatinine, Ser 1.33 (*)     Calcium 8.6 (*)    ALT 11 (*)    GFR calc non Af Amer 44 (*)    GFR calc Af Amer 51 (*)    All other components within normal limits  CBC - Abnormal; Notable for the following components:   RBC 4.08 (*)    Hemoglobin 12.7 (*)    HCT 38.2 (*)    All other components within normal limits  URINALYSIS, COMPLETE (UACMP) WITH MICROSCOPIC - Abnormal; Notable for the following components:   Hgb urine dipstick MODERATE (*)    Protein, ur 30 (*)    Squamous Epithelial / LPF 0-5 (*)    All other components within normal limits  LIPASE, BLOOD   ____________________________________________  EKG   ____________________________________________  RADIOLOGY   ____________________________________________   PROCEDURES  Procedure(s) performed:   Procedures  Critical Care performed:   ____________________________________________   INITIAL IMPRESSION / ASSESSMENT AND PLAN / ED COURSE  Pertinent labs & imaging results that were available during my care of the patient were reviewed by me and considered in my medical decision making (see chart for details).  Differential diagnosis includes, but is not limited to, acute appendicitis, renal colic, testicular torsion, urinary tract infection/pyelonephritis, prostatitis,  epididymitis, diverticulitis, small bowel obstruction or ileus, colitis, abdominal aortic aneurysm, gastroenteritis, hernia, etc. As part of my medical decision making, I reviewed the following data within the electronic MEDICAL RECORD NUMBER Notes from prior ED visits    ----------------------------------------- 1:12 PM on 06/29/2017 -----------------------------------------  Patient had his Foley catheter flushed and the urine is now clear.  Patient without any complaints at this time.  Says that he is already on Flomax.  Will be discharged with leg bag for follow-up with his urologist.  ____________________________________________   FINAL CLINICAL IMPRESSION(S) / ED  DIAGNOSES  Urinary retention.  Hematuria.    NEW MEDICATIONS STARTED DURING THIS VISIT:  New Prescriptions   No medications on file     Note:  This document was prepared using Dragon voice recognition software and may include unintentional dictation errors.     Orbie Pyo, MD 06/29/17 [phone]

## 2017-06-29 NOTE — ED Notes (Addendum)
Pt foley catheter irrigated with sterile water. Pt denies pain with irrigation, returned clear fluid.

## 2017-06-29 NOTE — ED Notes (Signed)
EDP at bedside  

## 2017-07-30 ENCOUNTER — Other Ambulatory Visit: Payer: Self-pay | Admitting: Orthopedic Surgery

## 2017-07-30 DIAGNOSIS — C61 Malignant neoplasm of prostate: Secondary | ICD-10-CM

## 2017-08-14 ENCOUNTER — Encounter
Admission: RE | Admit: 2017-08-14 | Discharge: 2017-08-14 | Disposition: A | Discharge status: Home / Self Care | Payer: Medicare Other | Source: Ambulatory Visit | Attending: Orthopedic Surgery | Admitting: Orthopedic Surgery

## 2017-08-14 ENCOUNTER — Ambulatory Visit
Admission: RE | Admit: 2017-08-14 | Discharge: 2017-08-14 | Disposition: A | Discharge status: Home / Self Care | Payer: Medicare Other | Source: Ambulatory Visit | Attending: Orthopedic Surgery | Admitting: Orthopedic Surgery

## 2017-08-14 DIAGNOSIS — R590 Localized enlarged lymph nodes: Secondary | ICD-10-CM | POA: Insufficient documentation

## 2017-08-14 DIAGNOSIS — I517 Cardiomegaly: Secondary | ICD-10-CM | POA: Insufficient documentation

## 2017-08-14 DIAGNOSIS — I2510 Atherosclerotic heart disease of native coronary artery without angina pectoris: Secondary | ICD-10-CM | POA: Insufficient documentation

## 2017-08-14 DIAGNOSIS — I700 Atherosclerosis of aorta: Secondary | ICD-10-CM | POA: Insufficient documentation

## 2017-08-14 DIAGNOSIS — C61 Malignant neoplasm of prostate: Secondary | ICD-10-CM | POA: Diagnosis not present

## 2017-08-14 DIAGNOSIS — K8020 Calculus of gallbladder without cholecystitis without obstruction: Secondary | ICD-10-CM | POA: Insufficient documentation

## 2017-08-14 DIAGNOSIS — N200 Calculus of kidney: Secondary | ICD-10-CM | POA: Insufficient documentation

## 2017-08-14 LAB — POCT I-STAT CREATININE: Creatinine, Ser: 1.4 mg/dL — ABNORMAL HIGH (ref 0.61–1.24)

## 2017-08-14 MED ORDER — TECHNETIUM TC 99M MEDRONATE IV KIT
22.6500 | PACK | Freq: Once | INTRAVENOUS | Status: AC | PRN
  Administered 2017-08-14: 22.65 via INTRAVENOUS

## 2017-08-14 MED ORDER — IOHEXOL 300 MG/ML  SOLN
75.0000 mL | Freq: Once | INTRAMUSCULAR | Status: AC | PRN
  Administered 2017-08-14: 75 mL via INTRAVENOUS

## 2017-08-30 NOTE — H&P (Deleted)
  The note originally documented on this encounter has been moved the the encounter in which it belongs.  

## 2017-08-30 NOTE — H&P (Signed)
NAME:  Theodore Padilla, Theodore Padilla NO.:  [id number]  MEDICAL RECORD NO.:  [id number]  LOCATION:                                 FACILITY:  PHYSICIAN:  Maryan Puls          DATE OF BIRTH:  06-01-1921  DATE OF ADMISSION:  09/04/2017 DATE OF DISCHARGE:                            HISTORY AND PHYSICAL   Same-day surgery, September 04, 2017.  CHIEF COMPLAINT:  Metastatic prostate cancer.  HISTORY OF PRESENT ILLNESS:  Theodore Padilla is a 84 year old with high- grade metastatic prostate cancer and urinary retention.  He has extensive retroperitoneal lymph node metastases as well as encroachment of the rectum by prostate cancer.  He has chosen to proceed with bilateral orchiectomy for management of his cancer.  He is currently taking bicalutamide.  He is also on tamsulosin, but has not been able to void successfully since his initial episode of urinary retention.  He comes in now for bilateral orchiectomy.  ALLERGIES: 1. PENICILLIN. 2. TRAMADOL. 3. CIPRO. 4. ALLOPURINOL. 5. PANTOPRAZOLE. 6. MELATONIN.  CURRENT MEDICATIONS:  Include Dexilant, Colace, finasteride, lisinopril/hydrochlorothiazide, magnesium hydroxide, MiraLAX, Carafate, tamsulosin, and bicalutamide.  PAST SURGICAL HISTORY: 1. Left nephrolithotomy in 1950. 2. Appendectomy in 1950. 3. Bilateral inguinal herniorrhaphy in 1970. 4. Lithotripsies x3 in the 1990s. 5. Lumbar spine repair in 2011. 6. Left hip replacement in 2002. 7. Right hip replacement in 2012.  SOCIAL HISTORY:  The patient quit smoking in 1960s with a 15-pack-year history.  He denied alcohol use.  PAST AND CURRENT MEDICAL CONDITIONS: 1. Coronary artery disease, status post coronary stent placement. 2. Degenerative spine disease with chronic back pain. 3. Hyperlipidemia. 4. Hypertension. 5. Osteoporosis. 6. Chronic constipation. 7. Moderate tricuspid insufficiency. 8. Moderate mitral insufficiency. 9. Vitamin B12  deficiency. 10.Obesity.  REVIEW OF SYSTEMS:  The patient has decreased auditory acuity.  He has chronic hoarseness and sore throat.  He has a chronic cough.  He has chronic shortness of breath.  He has weakness and fatigue.  He denied diabetes.  PHYSICAL EXAMINATION:  GENERAL:  Obese white male, in no distress. HEENT:  Sclerae clear. NECK:  Supple.  No palpable cervical adenopathy. LUNGS:  Clear to auscultation. CARDIOVASCULAR:  Regular rhythm and rate without audible murmurs. ABDOMEN:  Soft and nontender abdomen. GU:  Circumcised.  Foley catheter is in position. RECTAL:  Revealed a diffusely hard, nodular prostate, greater than 50 g in size. NEUROMUSCULAR:  Alert and oriented x3.  IMPRESSION: 1. Metastatic prostate cancer. 2. Urinary retention.  PLAN:  Bilateral orchiectomy.          ______________________________ Maryan Puls     MW/MEDQ  D:  08/30/2017  T:  08/30/2017  Job:  [id number]

## 2017-08-31 ENCOUNTER — Encounter
Admission: RE | Admit: 2017-08-31 | Discharge: 2017-08-31 | Disposition: A | Discharge status: Home / Self Care | Payer: Medicare Other | Source: Ambulatory Visit | Attending: Urology | Admitting: Urology

## 2017-08-31 ENCOUNTER — Other Ambulatory Visit: Payer: Self-pay

## 2017-08-31 DIAGNOSIS — Z01810 Encounter for preprocedural cardiovascular examination: Secondary | ICD-10-CM | POA: Diagnosis present

## 2017-08-31 DIAGNOSIS — Z01812 Encounter for preprocedural laboratory examination: Secondary | ICD-10-CM | POA: Diagnosis present

## 2017-08-31 DIAGNOSIS — I10 Essential (primary) hypertension: Secondary | ICD-10-CM | POA: Diagnosis not present

## 2017-08-31 HISTORY — DX: Localized edema: R60.0

## 2017-08-31 HISTORY — DX: Other congenital malformations of spine, not associated with scoliosis: Q76.49

## 2017-08-31 HISTORY — DX: Presence of coronary angioplasty implant and graft: Z95.5

## 2017-08-31 LAB — BASIC METABOLIC PANEL
ANION GAP: 6 (ref 5–15)
BUN: 31 mg/dL — ABNORMAL HIGH (ref 6–20)
CALCIUM: 8.5 mg/dL — AB (ref 8.9–10.3)
CO2: 27 mmol/L (ref 22–32)
Chloride: 105 mmol/L (ref 101–111)
Creatinine, Ser: 1.35 mg/dL — ABNORMAL HIGH (ref 0.61–1.24)
GFR calc Af Amer: 50 mL/min — ABNORMAL LOW (ref >60)
GFR, EST NON AFRICAN AMERICAN: 44 mL/min — AB (ref >60)
Glucose, Bld: 96 mg/dL (ref 65–99)
POTASSIUM: 4.1 mmol/L (ref 3.5–5.1)
SODIUM: 138 mmol/L (ref 135–145)

## 2017-08-31 NOTE — Pre-Procedure Instructions (Signed)
Met B results sent to Dr. Yves Dill and Anesthesia for review.  Also requested H&P from Dr. Yves Dill

## 2017-08-31 NOTE — Pre-Procedure Instructions (Addendum)
Patient complaining of being winded very easily with minimal activity. States that he has not seen his cardiologist in a while and would like to be cleared by Dr. Nehemiah Massed prior to surgery.   His lower extremities are edematous. Discussed the elevation of his legs but he really does not elevate very much "due to the catheter". We discussed several ways to work around the foley to increase the degree of elevation.  Also, patient does not drink much during the day and complains of constipation, which is chronic.  Recommended that he begin a stool softening regime prior to surgery and continue after for post op narcotics.  Also, he lives alone and does not have any family nearby to stay ovn with him after surgery.  A neighbor is willing to drive to and from hospital but not remain overnight at home.  Will discuss with Dr. Yves Dill to see if patient qualifies to be admitted for observation.  Patient sleeps in his recliner due to his "bad" and painful back. Informed him that the surgery could be delayed to accommodate a cardiology appointment for clearance.     Discussed medical history with dr. Amie Critchley and he requested a medical clearance to assure that patient is optimized for surgery. Clearance request sent to Dr. Edwina Barth and Dr. Yves Dill.

## 2017-08-31 NOTE — Patient Instructions (Addendum)
Your procedure is scheduled on: Tuesday, April 23rd  Report to [place].  DO NOT STOP ON FIRST FLOOR TO REGISTER  To find out your arrival time please              call [phone] between 1PM - 3PM on Monday, April 22nd  Remember: Instructions that are not followed completely may result in serious  medical risk, up to and including death, or upon the discretion of your surgeon  and anesthesiologist your surgery may need to be rescheduled.     _X__ 1. Do not eat food after midnight the night before your procedure.                 No gum chewing or hard candies.                 You may drink clear liquids up to 2 hours before you are scheduled to arrive for your                 surgery-                  DO not drink clear liquids within 2 hours of the start of your surgery.                  Clear Liquids include:  water, apple juice without pulp, clear carbohydrate                 drink such as Clearfast of [place]art[place]ade, Black Coffee or Tea (Do not add                 anything to coffee or tea).  __X__2.  On the morning of surgery brush your teeth with toothpaste and water,                    you may rinse your mouth with mouthwash if you wish.                         Do not swallow any toothpaste of mouthwash.     _X__ 3.  No Alcohol for 24 hours before or after surgery.   _X__ 4.  Do Not Smoke or use e-cigarettes For 24 Hours Prior to Your Surgery.                 Do not use any chewable tobacco products for at least 6 hours prior to                 surgery.  ____  5.  Bring all medications with you on the day of surgery if instructed.   ____  6.  Notify your doctor if there is any change in your medical condition      (cold, fever, infections).     Do not wear jewelry, make-up, hairpins, clips or nail polish. Do not wear lotions, powders, or perfumes. You may wear deodorant. Do not shave 48 hours prior to surgery. Men may  shave face and neck. Do not bring valuables to the hospital.    [facility] is not responsible for any belongings or valuables.  Contacts, dentures or bridgework may not be worn into surgery. Leave your suitcase in the car. After surgery it may be brought to your room. For patients admitted to the hospital, discharge time is determined by your treatment team.   Patients discharged the day of  surgery will not be allowed to drive home.   Please read over the following fact sheets that you were given:   PREPARING FOR SURGERY    ____ Take these medicines the morning of surgery with A SIP OF WATER:    1. FLOMAX  2. BICALUTAMIDE(CASONEX)  3.   4.  5.  6.  ____ Fleet Enema (as directed)   _X___ Use CHG Soap as directed  ____ Use inhalers on the day of surgery  _X___ Stop ALL ASPIRIN PRODUCTS TODAY!!  _X___ Stop Anti-inflammatories AS OF TODAY!!                  ____ Stop supplements until after surgery.    ____ Bring C-Pap to the hospital.   BEGIN A STOOL SOFTENER IF POSSIBLE, BEFORE SURGERY.  YOU WILL NEED TO CONTINUE TO TAKE THIS AFTER SURGERY. IF YOU ARE TAKING PAIN MEDICINE, THIS WILL BE VERY IMPORTANT.  MAKE SURE THAT [facility] WOULD BE ABLE TO PICK YOU UP THE DAY AFTER SURGERY IF YOU ARE ABLE TO STAY OVERNIGHT.  YOU NEED TO INCREASE YOUR FLUID INTAKE.  [place]

## 2017-09-03 NOTE — Pre-Procedure Instructions (Signed)
CALL FROM Baker Janus AT DR [facility] OFFICE. PATEIENT PCP OFFICE CLOSED TODAY. REQUESTED I FAX EKG/REQUEST INFO TO THEIR OFFICE. DONE AND ALSO FAXED TO DR [facility]OLFF'S OFFICE

## 2017-09-04 ENCOUNTER — Encounter: Admission: RE | Payer: Self-pay | Source: Ambulatory Visit

## 2017-09-04 ENCOUNTER — Ambulatory Visit: Admission: RE | Admit: 2017-09-04 | Payer: Medicare Other | Source: Ambulatory Visit | Admitting: Urology

## 2017-09-04 SURGERY — ORCHIECTOMY
Anesthesia: Choice | Laterality: Bilateral

## 2017-10-05 NOTE — H&P (Signed)
NAME: Theodore Padilla, LOFSTROM MEDICAL RECORD BJ:[id number] ACCOUNT [id number] DATE OF BIRTH:July 28, 1921 FACILITY: [facility] LOCATION: [facility] PHYSICIAN:Beyla Loney Farrel Conners, MD  HISTORY AND PHYSICAL  DATE OF ADMISSION:  10/16/2017  CHIEF COMPLAINT:  Prostate cancer.  HISTORY OF PRESENT ILLNESS:  The patient is a 84 year old Caucasian male with urinary retention, found to have metastatic prostate cancer.  Prostate biopsy 03/07 indicated Gleason's grade 10 adenocarcinoma involving all 12 core biopsies taken.  PSA was  19.0 ng/mL.  A CT scan in April indicated extensive lymph node metastasis of the retroperitoneal and pelvic lymph nodes as well as a large posterior mass in the region of the prostate causing compression of the rectum.  A bone scan was suspicious for  metastasis of the scapula.  The patient has opted for bilateral orchiectomy rather than Lupron because he is unable to afford the medication copay.  He comes in now for bilateral orchiectomy.  ALLERGIES:  PENICILLIN, TRAMADOL, PANTOPRAZOLE, MELATONIN, CIPRO AND ALLOPURINOL.  CURRENT MEDICATIONS:  Tamsulosin, bicalutamide, Dexilant, Colace, finasteride, lisinopril/hydrochlorothiazide, magnesium hydroxide, MiraLax, Carafate.  PAST SURGICAL HISTORY:  Left nephrolithotomy in 1950, appendectomy in 1950, bilateral inguinal herniorrhaphy in 1970, lithotripsy x3 in 1990s, lumbar spine repair 2011, left hip replacement in 2002, right hip replacement in 2012.  SOCIAL HISTORY:  The patient quit smoking in the 1960s with a 15-pack-year history.  He denied alcohol use.  FAMILY HISTORY:  Father died at age 64 of old age.  Mother died at age 48 of old age.  PAST AND CURRENT MEDICAL CONDITIONS: 1.  Coronary artery disease. 2.  Degenerative spine disease with chronic back pain. 3.  Hyperlipidemia. 4.  Hypertension. 5.  Osteoporosis. 6.  Chronic constipation. 7.  Moderate tricuspid insufficiency. 8.  Moderate mitral insufficiency. 9.  Vitamin B12  deficiency. 10.  Obesity.  REVIEW OF SYSTEMS:  The patient has decreased auditory acuity.  He has chronic hoarseness and sore throat.  He has a chronic cough and shortness of breath.  He has weakness and fatigue.  PHYSICAL EXAMINATION: GENERAL:  Obese white male in no acute distress. HEENT:  Sclerae were clear. NECK:  Supple.  No palpable cervical adenopathy. LUNGS:  Clear to auscultation. CARDIOVASCULAR:  Regular rhythm and rate without audible murmurs. ABDOMEN:  Soft, nontender abdomen. GENITOURINARY:  Uncircumcised atrophic testes. RECTAL:  Greater than 100 g firm nodular prostate. NEUROMUSCULAR:  Alert and oriented x3.  IMPRESSION: 1.  Metastatic prostate cancer. 2.  Urinary retention.  PLAN:  Bilateral orchiectomy.  LN/NUANCE  D:10/04/2017 T:10/04/2017 JOB:[id number]

## 2017-10-09 ENCOUNTER — Encounter
Admission: RE | Admit: 2017-10-09 | Discharge: 2017-10-09 | Disposition: A | Discharge status: Home / Self Care | Payer: Medicare Other | Source: Ambulatory Visit | Attending: Urology | Admitting: Urology

## 2017-10-09 ENCOUNTER — Other Ambulatory Visit: Payer: Self-pay

## 2017-10-09 NOTE — Patient Instructions (Signed)
Your procedure is scheduled on: 10-16-17 Report to Same Day Surgery 2nd floor medical mall [facility] Entrance-take elevator on left to 2nd floor.  Check in with surgery information desk.) To find out your arrival time please call [phone] between 1PM - 3PM on 10-15-17  Remember: Instructions that are not followed completely may result in serious medical risk, up to and including death, or upon the discretion of your surgeon and anesthesiologist your surgery may need to be rescheduled.    _x___ 1. Do not eat food after midnight the night before your procedure. NO GUM OR CANDY AFTER MIDNIGHT.  You may drink clear liquids up to 2 hours before you are scheduled to arrive at the hospital for your procedure.  Do not drink clear liquids within 2 hours of your scheduled arrival to the hospital.  Clear liquids include  --Water or Apple juice without pulp  --Clear carbohydrate beverage such as ClearFast or Gatorade  --Black Coffee or Clear Tea (No milk, no creamers, do not add anything to the coffee or Tea     __x__ 2. No Alcohol for 24 hours before or after surgery.   __x__3. No Smoking or e-cigarettes for 24 prior to surgery.  Do not use any chewable tobacco products for at least 6 hour prior to surgery   ____  4. Bring all medications with you on the day of surgery if instructed.    __x__ 5. Notify your doctor if there is any change in your medical condition     (cold, fever, infections).    x___6. On the morning of surgery brush your teeth with toothpaste and water.  You may rinse your mouth with mouth wash if you wish.  Do not swallow any toothpaste or mouthwash.   Do not wear jewelry, make-up, hairpins, clips or nail polish.  Do not wear lotions, powders, or perfumes. You may wear deodorant.  Do not shave 48 hours prior to surgery. Men may shave face and neck.  Do not bring valuables to the hospital.    [facility] is not responsible for any belongings or valuables.    Contacts, dentures or bridgework may not be worn into surgery.  Leave your suitcase in the car. After surgery it may be brought to your room.  For patients admitted to the hospital, discharge time is determined by your treatment team.  _  Patients discharged the day of surgery will not be allowed to drive home.  You will need someone to drive you home and stay with you the night of your procedure.     ____ Take anti-hypertensive listed below, cardiac, seizure, asthma, anti-reflux and psychiatric medicines. These include:  1. NONE  2.  3.  4.  5.  6.  ____Fleets enema or Magnesium Citrate as directed.   ____ Use CHG Soap or sage wipes as directed on instruction sheet   ____ Use inhalers on the day of surgery and bring to hospital day of surgery  ____ Stop Metformin and Janumet 2 days prior to surgery.    ____ Take 1/2 of usual insulin dose the night before surgery and none on the morning surgery.   ____ Follow recommendations from Cardiologist, Pulmonologist or PCP regarding stopping Aspirin, Coumadin, Plavix ,Eliquis, Effient, or Pradaxa, and Pletal.  X____Stop Anti-inflammatories such as Advil, Aleve, Ibuprofen, Motrin, Naproxen, Naprosyn, Goodies powders or aspirin products NOW-OK to take Tylenol    _x___ Stop supplements until after surgery-STOP TART CHERRY NOW-MAY RESUME AFTER SURGERY   ____ [employer]  C-Pap to the hospital.

## 2017-10-09 NOTE — Pre-Procedure Instructions (Signed)
CARDIAC CLEARANCE ON CHART FROM DR Philmore Pali RISK

## 2017-10-15 MED ORDER — GENTAMICIN SULFATE 40 MG/ML IJ SOLN
80.0000 mg | Freq: Once | INTRAVENOUS | Status: DC
  Filled 2017-10-15: qty 2

## 2017-10-15 MED ORDER — GENTAMICIN IN SALINE 1.6-0.9 MG/ML-% IV SOLN
80.0000 mg | Freq: Once | INTRAVENOUS | Status: AC
  Administered 2017-10-16: 80 mg via INTRAVENOUS
  Filled 2017-10-15: qty 50

## 2017-10-16 ENCOUNTER — Encounter: Payer: Self-pay | Admitting: *Deleted

## 2017-10-16 ENCOUNTER — Encounter
Admission: RE | Disposition: A | Discharge status: Home / Self Care | Payer: Self-pay | Source: Ambulatory Visit | Attending: Urology

## 2017-10-16 ENCOUNTER — Other Ambulatory Visit: Payer: Self-pay

## 2017-10-16 ENCOUNTER — Ambulatory Visit: Payer: Medicare Other | Admitting: Anesthesiology

## 2017-10-16 ENCOUNTER — Ambulatory Visit
Admission: RE | Admit: 2017-10-16 | Discharge: 2017-10-16 | Disposition: A | Discharge status: Home / Self Care | Payer: Medicare Other | Source: Ambulatory Visit | Attending: Urology | Admitting: Urology

## 2017-10-16 DIAGNOSIS — C61 Malignant neoplasm of prostate: Secondary | ICD-10-CM | POA: Diagnosis present

## 2017-10-16 DIAGNOSIS — Z880 Allergy status to penicillin: Secondary | ICD-10-CM | POA: Insufficient documentation

## 2017-10-16 DIAGNOSIS — Z87891 Personal history of nicotine dependence: Secondary | ICD-10-CM | POA: Insufficient documentation

## 2017-10-16 DIAGNOSIS — C7989 Secondary malignant neoplasm of other specified sites: Secondary | ICD-10-CM | POA: Diagnosis not present

## 2017-10-16 DIAGNOSIS — E669 Obesity, unspecified: Secondary | ICD-10-CM | POA: Insufficient documentation

## 2017-10-16 DIAGNOSIS — I2510 Atherosclerotic heart disease of native coronary artery without angina pectoris: Secondary | ICD-10-CM | POA: Insufficient documentation

## 2017-10-16 DIAGNOSIS — Z955 Presence of coronary angioplasty implant and graft: Secondary | ICD-10-CM | POA: Insufficient documentation

## 2017-10-16 DIAGNOSIS — Z6833 Body mass index (BMI) 33.0-33.9, adult: Secondary | ICD-10-CM | POA: Insufficient documentation

## 2017-10-16 DIAGNOSIS — Z888 Allergy status to other drugs, medicaments and biological substances status: Secondary | ICD-10-CM | POA: Diagnosis not present

## 2017-10-16 DIAGNOSIS — Z881 Allergy status to other antibiotic agents status: Secondary | ICD-10-CM | POA: Insufficient documentation

## 2017-10-16 DIAGNOSIS — Z96643 Presence of artificial hip joint, bilateral: Secondary | ICD-10-CM | POA: Diagnosis not present

## 2017-10-16 DIAGNOSIS — Z79899 Other long term (current) drug therapy: Secondary | ICD-10-CM | POA: Insufficient documentation

## 2017-10-16 DIAGNOSIS — E785 Hyperlipidemia, unspecified: Secondary | ICD-10-CM | POA: Insufficient documentation

## 2017-10-16 DIAGNOSIS — I10 Essential (primary) hypertension: Secondary | ICD-10-CM | POA: Diagnosis not present

## 2017-10-16 DIAGNOSIS — R339 Retention of urine, unspecified: Secondary | ICD-10-CM

## 2017-10-16 DIAGNOSIS — C772 Secondary and unspecified malignant neoplasm of intra-abdominal lymph nodes: Secondary | ICD-10-CM | POA: Insufficient documentation

## 2017-10-16 HISTORY — PX: ORCHIECTOMY: SHX2116

## 2017-10-16 LAB — POCT I-STAT 4, (NA,K, GLUC, HGB,HCT)
Glucose, Bld: 111 mg/dL — ABNORMAL HIGH (ref 65–99)
HEMATOCRIT: 35 % — AB (ref 39.0–52.0)
HEMOGLOBIN: 11.9 g/dL — AB (ref 13.0–17.0)
Potassium: 5.1 mmol/L (ref 3.5–5.1)
Sodium: 140 mmol/L (ref 135–145)

## 2017-10-16 SURGERY — ORCHIECTOMY
Anesthesia: General | Site: Groin | Laterality: Bilateral | Wound class: "Clean "

## 2017-10-16 MED ORDER — BUPIVACAINE HCL (PF) 0.5 % IJ SOLN
INTRAMUSCULAR | Status: AC
Start: 2017-10-16 — End: ?
  Filled 2017-10-16: qty 30

## 2017-10-16 MED ORDER — FENTANYL CITRATE (PF) 100 MCG/2ML IJ SOLN
INTRAMUSCULAR | Status: AC
  Filled 2017-10-16: qty 2

## 2017-10-16 MED ORDER — ACETAMINOPHEN-CODEINE #3 300-30 MG PO TABS: 1.0000 | ORAL_TABLET | ORAL | 2 refills | Status: DC | PRN

## 2017-10-16 MED ORDER — LIDOCAINE HCL (PF) 1 % IJ SOLN
INTRAMUSCULAR | Status: AC
  Filled 2017-10-16: qty 30

## 2017-10-16 MED ORDER — ONDANSETRON HCL 4 MG/2ML IJ SOLN: 4.0000 mg | Freq: Once | INTRAMUSCULAR | Status: DC | PRN

## 2017-10-16 MED ORDER — GLYCOPYRROLATE 0.2 MG/ML IJ SOLN
INTRAMUSCULAR | Status: DC | PRN
  Administered 2017-10-16: 0.1 mg via INTRAVENOUS

## 2017-10-16 MED ORDER — FAMOTIDINE 20 MG PO TABS
ORAL_TABLET | ORAL | Status: AC
  Administered 2017-10-16: 20 mg via ORAL
  Filled 2017-10-16: qty 1

## 2017-10-16 MED ORDER — ROCURONIUM BROMIDE 50 MG/5ML IV SOLN
INTRAVENOUS | Status: AC
  Filled 2017-10-16: qty 1

## 2017-10-16 MED ORDER — LIDOCAINE HCL 1 % IJ SOLN
INTRAMUSCULAR | Status: DC | PRN
  Administered 2017-10-16: 12 mL

## 2017-10-16 MED ORDER — PROPOFOL 500 MG/50ML IV EMUL
INTRAVENOUS | Status: DC | PRN
  Administered 2017-10-16: 35 ug/kg/min via INTRAVENOUS

## 2017-10-16 MED ORDER — FENTANYL CITRATE (PF) 100 MCG/2ML IJ SOLN
INTRAMUSCULAR | Status: DC | PRN
  Administered 2017-10-16 (×3): 25 ug via INTRAVENOUS

## 2017-10-16 MED ORDER — FENTANYL CITRATE (PF) 100 MCG/2ML IJ SOLN: 25.0000 ug | INTRAMUSCULAR | Status: DC | PRN

## 2017-10-16 MED ORDER — MIDAZOLAM HCL 2 MG/2ML IJ SOLN
INTRAMUSCULAR | Status: AC
  Filled 2017-10-16: qty 2

## 2017-10-16 MED ORDER — LIDOCAINE HCL (CARDIAC) PF 100 MG/5ML IV SOSY
PREFILLED_SYRINGE | INTRAVENOUS | Status: DC | PRN
  Administered 2017-10-16: 60 mg via INTRAVENOUS

## 2017-10-16 MED ORDER — PROPOFOL 10 MG/ML IV BOLUS
INTRAVENOUS | Status: AC
  Filled 2017-10-16: qty 20

## 2017-10-16 MED ORDER — FAMOTIDINE 20 MG PO TABS
20.0000 mg | ORAL_TABLET | Freq: Once | ORAL | Status: AC
  Administered 2017-10-16: 20 mg via ORAL

## 2017-10-16 MED ORDER — BUPIVACAINE HCL 0.5 % IJ SOLN
INTRAMUSCULAR | Status: DC | PRN
  Administered 2017-10-16: 12 mL

## 2017-10-16 MED ORDER — LACTATED RINGERS IV SOLN
INTRAVENOUS | Status: DC
  Administered 2017-10-16: 15:00:00 via INTRAVENOUS

## 2017-10-16 MED ORDER — SEVOFLURANE IN SOLN
RESPIRATORY_TRACT | Status: AC
  Filled 2017-10-16: qty 250

## 2017-10-16 SURGICAL SUPPLY — 40 items
BLADE SURG 15 STRL LF DISP TIS (BLADE) IMPLANT
BLADE SURG 15 STRL SS (BLADE) ×2
CANISTER SUCT 1200ML W/VALVE (MISCELLANEOUS) ×3 IMPLANT
CATH FOL 2WAY LX 18X5 (CATHETERS) ×2 IMPLANT
CLOSURE WOUND 1/2 X4 (GAUZE/BANDAGES/DRESSINGS) ×1
DRAPE LAPAROTOMY 77X122 PED (DRAPES) ×3 IMPLANT
DRSG GAUZE FLUFF 36X18 (GAUZE/BANDAGES/DRESSINGS) ×2 IMPLANT
DRSG TEGADERM 4X4.75 (GAUZE/BANDAGES/DRESSINGS) ×3 IMPLANT
DRSG TELFA 4X3 1S NADH ST (GAUZE/BANDAGES/DRESSINGS) ×5 IMPLANT
ELECT REM PT RETURN 9FT ADLT (ELECTROSURGICAL) ×3
ELECTRODE REM PT RTRN 9FT ADLT (ELECTROSURGICAL) ×1 IMPLANT
GLOVE BIO SURGEON STRL SZ7.5 (GLOVE) ×3 IMPLANT
GOWN STRL REUS W/ TWL LRG LVL3 (GOWN DISPOSABLE) ×3 IMPLANT
GOWN STRL REUS W/TWL LRG LVL3 (GOWN DISPOSABLE) ×6
KIT TURNOVER KIT A (KITS) ×3 IMPLANT
LABEL OR SOLS (LABEL) ×3 IMPLANT
NDL FILTER BLUNT 18X1 1/2 (NEEDLE) ×1 IMPLANT
NDL HYPO 25X1 1.5 SAFETY (NEEDLE) IMPLANT
NEEDLE FILTER BLUNT 18X 1/2SAF (NEEDLE) ×2
NEEDLE FILTER BLUNT 18X1 1/2 (NEEDLE) ×1 IMPLANT
NEEDLE HYPO 25X1 1.5 SAFETY (NEEDLE) ×3 IMPLANT
NS IRRIG 1000ML POUR BTL (IV SOLUTION) ×3 IMPLANT
PACK BASIN MINOR ARMC (MISCELLANEOUS) ×3 IMPLANT
SPONGE KITTNER 5P (MISCELLANEOUS) ×3 IMPLANT
STRIP CLOSURE SKIN 1/2X4 (GAUZE/BANDAGES/DRESSINGS) ×2 IMPLANT
SUPPORT SCROTAL LG STRP (MISCELLANEOUS) ×1 IMPLANT
SUPPORTER ATHLETIC LG (MISCELLANEOUS) ×1
SUT CHROMIC 3 0 SH 27 (SUTURE) ×2 IMPLANT
SUT CHROMIC GUT BROWN 0 54 (SUTURE) ×1 IMPLANT
SUT CHROMIC GUT BROWN 0 54IN (SUTURE) ×6
SUT PLAIN 2 0 (SUTURE) ×6
SUT PLAIN 3 0 SH 27IN (SUTURE) ×3 IMPLANT
SUT PLAIN ABS 2-0 CT1 27XMFL (SUTURE) IMPLANT
SUT SILK 0 SH 30 (SUTURE) ×3 IMPLANT
SUT VIC AB 3-0 SH 27 (SUTURE) ×2
SUT VIC AB 3-0 SH 27X BRD (SUTURE) ×1 IMPLANT
SUT VIC AB 4-0 FS2 27 (SUTURE) ×3 IMPLANT
SYR 10ML LL (SYRINGE) ×3 IMPLANT
SYR 20CC LL (SYRINGE) ×3 IMPLANT
WATER STERILE IRR 1000ML POUR (IV SOLUTION) ×1 IMPLANT

## 2017-10-16 NOTE — Anesthesia Postprocedure Evaluation (Signed)
Anesthesia Post Note  Patient: Theodore Padilla  Procedure(s) Performed: ORCHIECTOMY (Bilateral Groin)  Patient location during evaluation: PACU Anesthesia Type: General Level of consciousness: awake and alert Pain management: pain level controlled Vital Signs Assessment: post-procedure vital signs reviewed and stable Respiratory status: spontaneous breathing, nonlabored ventilation, respiratory function stable and patient connected to nasal cannula oxygen Cardiovascular status: blood pressure returned to baseline and stable Postop Assessment: no apparent nausea or vomiting Anesthetic complications: no     Last Vitals:  Vitals:   10/16/17 1921 10/16/17 1924  BP: (!) 141/84   Pulse: (!) 56 (!) 58  Resp: (!) 21 (!) 27  Temp:  (!) 36.3 C  SpO2: 100% 100%    Last Pain:  Vitals:   10/16/17 1924  TempSrc:   PainSc: 0-No pain                 Rhiannan Kievit S

## 2017-10-16 NOTE — Anesthesia Preprocedure Evaluation (Signed)
Anesthesia Evaluation  Patient identified by MRN, date of birth, ID band Patient awake    Reviewed: Allergy & Precautions, NPO status , Patient's Chart, lab work & pertinent test results, reviewed documented beta blocker date and time   Airway Mallampati: III  TM Distance: >3 FB     Dental  (+) Chipped, Partial Lower, Partial Upper   Pulmonary shortness of breath, sleep apnea , former smoker,           Cardiovascular hypertension, + CAD and + Cardiac Stents       Neuro/Psych    GI/Hepatic   Endo/Other    Renal/GU Renal disease     Musculoskeletal  (+) Arthritis ,   Abdominal   Peds  Hematology   Anesthesia Other Findings Obese.  Reproductive/Obstetrics                             Anesthesia Physical Anesthesia Plan  ASA: II  Anesthesia Plan: General   Post-op Pain Management:    Induction: Intravenous  PONV Risk Score and Plan:   Airway Management Planned: LMA  Additional Equipment:   Intra-op Plan:   Post-operative Plan:   Informed Consent: I have reviewed the patients History and Physical, chart, labs and discussed the procedure including the risks, benefits and alternatives for the proposed anesthesia with the patient or authorized representative who has indicated his/her understanding and acceptance.     Plan Discussed with: CRNA  Anesthesia Plan Comments:         Anesthesia Quick Evaluation

## 2017-10-16 NOTE — Transfer of Care (Signed)
Immediate Anesthesia Transfer of Care Note  Patient: Theodore Padilla  Procedure(s) Performed: ORCHIECTOMY (Bilateral Groin)  Patient Location: PACU  Anesthesia Type:General  Level of Consciousness: awake, alert , oriented and patient cooperative  Airway & Oxygen Therapy: Patient Spontanous Breathing  Post-op Assessment: Report given to RN and Post -op Vital signs reviewed and stable  Post vital signs: Reviewed and stable  Last Vitals:  Vitals Value Taken Time  BP    Temp    Pulse 57 10/16/2017  6:49 PM  Resp 17 10/16/2017  6:49 PM  SpO2 96 % 10/16/2017  6:49 PM  Vitals shown include unvalidated device data.  Last Pain:  Vitals:   10/16/17 1527  TempSrc: Temporal  PainSc:          Complications: No apparent anesthesia complications

## 2017-10-16 NOTE — Anesthesia Post-op Follow-up Note (Signed)
Anesthesia QCDR form completed.        

## 2017-10-16 NOTE — H&P (Signed)
Date of Initial H&P: 10/04/17  History reviewed, patient examined, no change in status, stable for surgery.  

## 2017-10-16 NOTE — Op Note (Signed)
Preoperative diagnosis: Metastatic prostate cancer  Postoperative diagnosis: Same  Procedure: Bilateral orchiectomy  Surgeon: Otelia Limes. Yves Dill MD  Anesthesia: General  Indications:See the history and physical. After informed consent the above procedure(s) were requested     Technique and findings: After adequate anesthesia pain patient's perineum was prepped and draped usual fashion.  Bilateral spermatic cord blocks were performed with a mixture of 1% Xylocaine and 0.5% Marcaine.  After adequate local anesthesia was obtained a midline raphae scrotal incision was made and carried down sharply through the skin into the subcutaneous fascia with electrocautery.  The right testicle was delivered into the surgical field.  The vas deferens was isolated and ligated with 3-0 chromic ties and divided.  The spermatic cord structures were ligated with a 2-0 stick tie and a chromic tie.  The procedure was then repeated on the left side.  Bleeders were attended to with electrocautery.  The dartos was then closed with running and locking 3-0 Vicryl suture.  The skin was reapproximated with interrupted 3-0 chromic.  Sterile dressing and scrotal supporter were applied.  The loss was minimal.  Sponge, needle, and instrument counts were noted be correct.  The procedure was then terminated and patient transferred to the recovery room in stable condition.

## 2017-10-16 NOTE — Discharge Instructions (Signed)
AMBULATORY SURGERY  DISCHARGE INSTRUCTIONS   1) The drugs that you were given will stay in your system until tomorrow so for the next 24 hours you should not:  A) Drive an automobile B) Make any legal decisions C) Drink any alcoholic beverage   2) You may resume regular meals tomorrow.  Today it is better to start with liquids and gradually work up to solid foods.  You may eat anything you prefer, but it is better to start with liquids, then soup and crackers, and gradually work up to solid foods.   3) Please notify your doctor immediately if you have any unusual bleeding, trouble breathing, redness and pain at the surgery site, drainage, fever, or pain not relieved by medication. 4)   5) Your post-operative visit with Dr.                                     is: Date:                        Time:    Please call to schedule your post-operative visit.  6) Additional Instructions:      Orchiectomy, Care After This sheet gives you information about how to care for yourself after your procedure. Your health care provider may also give you more specific instructions. If you have problems or questions, contact your health care provider. What can I expect after the procedure? After the procedure, it is common to have:  Pain.  Bruising.  Blood pooling (hematoma) in the area where your testicles were removed.  Depression or mood changes.  Fatigue.  Hot flashes.  Follow these instructions at home: Managing pain and swelling  If directed, put ice on the affected area: ? Put ice in a plastic bag. ? Place a towel between your skin and the bag. ? Leave the ice on for 20 minutes, 2-3 times a day.  Wear scrotal support as told by your health care provider.  To relieve pressure and pain when sitting, you may use a donut cushion if directed by your health care provider. Incision care  Follow instructions from your health care provider about how to take care of your incision.  Make sure you: ? Wash your hands with soap and water before you change your bandage (dressing). If soap and water are not available, use hand sanitizer. ? Change your dressing once a day, or as often as told by your health care provider. If the dressing sticks to your incision area: ? Use warm, soapy water or hydrogen peroxide to dampen the dressing. ? When the dressing becomes loose, lift it from the incision area. Make sure that the incision stays closed. ? Leave stitches (sutures), skin glue, or adhesive strips in place. These skin closures may need to stay in place for 2 weeks or longer. If adhesive strip edges start to loosen and curl up, you may trim the loose edges. Do not remove adhesive strips completely unless your health care provider tells you to do that.  Keep your dressing dry until it has been removed.  Check your incision area every day for signs of infection. Check for: ? More redness, swelling, or pain. ? More fluid or blood. ? Warmth. ? Pus or a bad smell. Bathing  Do not take baths, swim, or use a hot tub until your health care provider approves. You may start taking  showers two days after your procedure.  Do not rub your incision to dry it. Pat the area gently with a clean cloth or let it air-dry. Medicines  Take over-the-counter and prescription medicines only as told by your health care provider.  If you were prescribed an antibiotic medicine, use it as told by your health care provider. Do not stop using the antibiotic even if you start to feel better.  If you had both testicles removed, talk with your health care provider about medicine supplements to replace one of the male hormones (testosterone) that your body will no longer make. Driving  Do not drive for 24 hours if you were given a medicine to help you relax (sedative).  Do not drive or use heavy machinery while taking prescription pain medicines. Activity  Avoid activities that may cause your incision  to open, such as jogging, playing sports, and straining with bowel movements. Ask your health care provider what activities are safe for you.  Do not lift anything that is heavier than 10 lb (4.5 kg), or the limit that your health care provider tells you, until he or she says that it is safe.  Do not engage in sexual activity until the area is healed and your health care provider approves. This could take up to 4 weeks. General instructions  To prevent or treat constipation while you are taking prescription pain medicine, your health care provider may recommend that you: ? Drink enough fluid to keep your urine clear or pale yellow. ? Take over-the-counter or prescription medicines. ? Eat foods that are high in fiber, such as fresh fruits and vegetables, whole grains, and beans. ? Limit foods that are high in fat and processed sugars, such as fried and sweet foods.  Do not use any products that contain nicotine or tobacco, such as cigarettes and e-cigarettes. If you need help quitting, ask your health care provider.  Keep all follow-up visits as told by your health care provider. This is important. Contact a health care provider if:  You have more pain, swelling, or redness in your genital or groin area.  You have more fluid or blood coming from your incision.  Your incision feels warm to the touch.  You have pus or a bad smell coming from your incision.  You have constipation that is not helped by changing your diet or drinking more fluid.  You develop nausea or vomiting.  You cannot eat or drink without vomiting. Get help right away if:  You have dizziness or nausea that does not go away.  You have trouble breathing.  You have a wet (congested) cough.  You have a fever or shaking chills.  Your incision breaks open after the skin closures have been removed.  You are not able to urinate. Summary  After this procedure, it is most common to have bruising or blood pooling in  the area where the testicles were removed.  You should check your incision area every day for signs of infection, such as redness, swelling, pain, fluid, blood, warmth, pus, or a bad smell.  Avoid activities that may cause your incision to open, such as jogging, playing sports, and straining with bowel movements. Ask your health care provider what activities are safe for you.  You should not engage in sexual activity until the area is healed and your health care provider approves. This could take up to 4 weeks.  Men who have both testicles removed may have emotional and physical side effects. Your  health care provider can help you with ways to manage those side effects. This information is not intended to replace advice given to you by your health care provider. Make sure you discuss any questions you have with your health care provider. Document Released: 01/01/2013 Document Revised: 03/23/2016 Document Reviewed: 03/23/2016 Elsevier Interactive Patient Education  2017 [employer].

## 2017-10-17 ENCOUNTER — Encounter: Payer: Self-pay | Admitting: Urology

## 2017-10-18 LAB — SURGICAL PATHOLOGY

## 2018-01-18 ENCOUNTER — Other Ambulatory Visit: Payer: Self-pay | Admitting: Orthopedic Surgery

## 2018-01-18 DIAGNOSIS — R1900 Intra-abdominal and pelvic swelling, mass and lump, unspecified site: Secondary | ICD-10-CM

## 2018-01-18 DIAGNOSIS — K6289 Other specified diseases of anus and rectum: Secondary | ICD-10-CM

## 2018-01-25 ENCOUNTER — Ambulatory Visit
Admission: RE | Admit: 2018-01-25 | Discharge: 2018-01-25 | Disposition: A | Discharge status: Home / Self Care | Payer: Medicare Other | Source: Ambulatory Visit | Attending: Orthopedic Surgery | Admitting: Orthopedic Surgery

## 2018-01-25 DIAGNOSIS — K8020 Calculus of gallbladder without cholecystitis without obstruction: Secondary | ICD-10-CM | POA: Diagnosis not present

## 2018-01-25 DIAGNOSIS — K629 Disease of anus and rectum, unspecified: Secondary | ICD-10-CM | POA: Insufficient documentation

## 2018-01-25 DIAGNOSIS — N2889 Other specified disorders of kidney and ureter: Secondary | ICD-10-CM | POA: Diagnosis not present

## 2018-01-25 DIAGNOSIS — R1900 Intra-abdominal and pelvic swelling, mass and lump, unspecified site: Secondary | ICD-10-CM | POA: Diagnosis not present

## 2018-01-25 DIAGNOSIS — N200 Calculus of kidney: Secondary | ICD-10-CM | POA: Insufficient documentation

## 2018-01-25 DIAGNOSIS — Z96642 Presence of left artificial hip joint: Secondary | ICD-10-CM | POA: Diagnosis not present

## 2018-01-25 DIAGNOSIS — R590 Localized enlarged lymph nodes: Secondary | ICD-10-CM | POA: Insufficient documentation

## 2018-01-25 DIAGNOSIS — K6289 Other specified diseases of anus and rectum: Secondary | ICD-10-CM

## 2018-01-25 HISTORY — DX: Malignant neoplasm of prostate: C61

## 2018-01-25 MED ORDER — IOHEXOL 300 MG/ML  SOLN
100.0000 mL | Freq: Once | INTRAMUSCULAR | Status: AC | PRN
  Administered 2018-01-25: 100 mL via INTRAVENOUS

## 2018-02-01 ENCOUNTER — Telehealth: Payer: Self-pay | Admitting: Pharmacist

## 2018-02-01 ENCOUNTER — Other Ambulatory Visit: Payer: Self-pay

## 2018-02-01 ENCOUNTER — Inpatient Hospital Stay: Payer: Medicare Other | Attending: Internal Medicine | Admitting: Internal Medicine

## 2018-02-01 ENCOUNTER — Encounter: Payer: Self-pay | Admitting: *Deleted

## 2018-02-01 DIAGNOSIS — C61 Malignant neoplasm of prostate: Secondary | ICD-10-CM | POA: Diagnosis present

## 2018-02-01 DIAGNOSIS — I10 Essential (primary) hypertension: Secondary | ICD-10-CM | POA: Diagnosis not present

## 2018-02-01 DIAGNOSIS — K8020 Calculus of gallbladder without cholecystitis without obstruction: Secondary | ICD-10-CM | POA: Diagnosis not present

## 2018-02-01 DIAGNOSIS — N401 Enlarged prostate with lower urinary tract symptoms: Secondary | ICD-10-CM | POA: Insufficient documentation

## 2018-02-01 DIAGNOSIS — C7951 Secondary malignant neoplasm of bone: Secondary | ICD-10-CM

## 2018-02-01 DIAGNOSIS — G4730 Sleep apnea, unspecified: Secondary | ICD-10-CM | POA: Diagnosis not present

## 2018-02-01 DIAGNOSIS — Z8546 Personal history of malignant neoplasm of prostate: Secondary | ICD-10-CM | POA: Diagnosis not present

## 2018-02-01 DIAGNOSIS — E785 Hyperlipidemia, unspecified: Secondary | ICD-10-CM | POA: Diagnosis not present

## 2018-02-01 DIAGNOSIS — R1900 Intra-abdominal and pelvic swelling, mass and lump, unspecified site: Secondary | ICD-10-CM | POA: Diagnosis not present

## 2018-02-01 DIAGNOSIS — Z7189 Other specified counseling: Secondary | ICD-10-CM

## 2018-02-01 DIAGNOSIS — R911 Solitary pulmonary nodule: Secondary | ICD-10-CM | POA: Diagnosis not present

## 2018-02-01 DIAGNOSIS — Z192 Hormone resistant malignancy status: Secondary | ICD-10-CM | POA: Insufficient documentation

## 2018-02-01 DIAGNOSIS — M109 Gout, unspecified: Secondary | ICD-10-CM | POA: Diagnosis not present

## 2018-02-01 DIAGNOSIS — I2510 Atherosclerotic heart disease of native coronary artery without angina pectoris: Secondary | ICD-10-CM | POA: Diagnosis not present

## 2018-02-01 DIAGNOSIS — Z79899 Other long term (current) drug therapy: Secondary | ICD-10-CM | POA: Insufficient documentation

## 2018-02-01 DIAGNOSIS — R338 Other retention of urine: Secondary | ICD-10-CM | POA: Insufficient documentation

## 2018-02-01 DIAGNOSIS — K5909 Other constipation: Secondary | ICD-10-CM | POA: Insufficient documentation

## 2018-02-01 DIAGNOSIS — Z87891 Personal history of nicotine dependence: Secondary | ICD-10-CM | POA: Insufficient documentation

## 2018-02-01 DIAGNOSIS — Z87442 Personal history of urinary calculi: Secondary | ICD-10-CM | POA: Diagnosis not present

## 2018-02-01 NOTE — Progress Notes (Signed)
[place] NOTE  Patient Care Team: Baxter Hire, MD as PCP - General (Internal Medicine)  CHIEF COMPLAINTS/PURPOSE OF CONSULTATION:  Prostate cancer  #  Oncology History   # MARCH 2019 PROSTATE CANCER [Glason score: 5+5]; psa- 19;  s/p bil orchiectomy [june 2019; Dr.Wolfe];   # Progression/constipation- SEP 2019- large pelvic mass involving rectum/bladder; PA- 4.2.   # sep19th- Apalutamide [samples] 4/day.   # acute urinary retention s/p foley   DIAGNOSIS: CASTRATE RESISTANT PROSTATE CANCER  STAGE: IV  ;GOALS:Palliative  CURRENT/MOST RECENT THERAPY: Apalutamide-       Prostate cancer ([place])     HISTORY OF PRESENTING ILLNESS:  Theodore Padilla 84 y.o.  male with recently diagnosed metastatic prostate cancer in June 2019 followed by bilateral orchiectomy is here to discuss regarding further treatment options.  Patient continued to have worsening constipation for which she had a CT scan done early September 2019 that showed a large pelvic mass approximately 8 cm in size-with compressive symptoms on the rectum/involving the bladder.  Patient currently has a Foley catheter.  Denies any blood in urine yet.  No nausea no vomiting.  Review of Systems  Constitutional: Positive for malaise/fatigue and weight loss. Negative for chills, diaphoresis and fever.  HENT: Negative for nosebleeds and sore throat.   Eyes: Negative for double vision.  Respiratory: Negative for cough, hemoptysis, sputum production, shortness of breath and wheezing.   Cardiovascular: Negative for chest pain, palpitations, orthopnea and leg swelling.  Gastrointestinal: Positive for constipation. Negative for abdominal pain, blood in stool, diarrhea, heartburn, melena, nausea and vomiting.  Genitourinary: Negative for dysuria, frequency and urgency.  Musculoskeletal: Positive for back pain and joint pain.  Skin: Negative.  Negative for itching and rash.  Neurological: Negative for  dizziness, tingling, focal weakness, weakness and headaches.  Endo/Heme/Allergies: Does not bruise/bleed easily.  Psychiatric/Behavioral: Negative for depression. The patient is not nervous/anxious and does not have insomnia.      MEDICAL HISTORY:  Past Medical History:  Diagnosis Date  . Arthritis   . B12 deficiency   . BPH (benign prostatic hyperplasia)   . Chronic constipation   . Coronary artery disease    with stenting x 1  . Degeneration of spine   . Gout   . Hearing aid worn    bilateral  . History of heart artery stent 2002  . History of kidney stones   . HLD (hyperlipidemia)   . Hypertension   . Lower extremity edema 2019  . Moderate mitral insufficiency   . Moderate tricuspid insufficiency   . Osteoporosis   . Prostate cancer ([place])   . Shortness of breath    occasional  . Sleep apnea    no CPAP, pt denies OSA  . Spine deformity 2019   has severe back pain. previously had surgery without success for improvement  . Urinary hesitancy   . Urinary retention   . Wears dentures    partial upper and lower    SURGICAL HISTORY: Past Surgical History:  Procedure Laterality Date  . APPENDECTOMY    . BACK SURGERY    . CARDIAC CATHETERIZATION  2000   with stent  . CATARACT EXTRACTION W/PHACO Right 04/18/2017   Procedure: CATARACT EXTRACTION PHACO AND INTRAOCULAR LENS PLACEMENT ([place]) RIGHT;  Surgeon: Leandrew Koyanagi, MD;  Location: [facility];  Service: Ophthalmology;  Laterality: Right;  . CATARACT EXTRACTION W/PHACO Left 05/23/2017   Procedure: CATARACT EXTRACTION PHACO AND INTRAOCULAR LENS PLACEMENT ([facility]) LEFT;  Surgeon: Wallace Going,  Nila Nephew, MD;  Location: [facility];  Service: Ophthalmology;  Laterality: Left;  . ESOPHAGOGASTRODUODENOSCOPY (EGD) WITH PROPOFOL N/A 07/12/2016   Procedure: ESOPHAGOGASTRODUODENOSCOPY (EGD) WITH PROPOFOL;  Surgeon: Jonathon Bellows, MD;  Location: [facility];  Service: Endoscopy;  Laterality: N/A;  . FOREIGN BODY  REMOVAL     L knee  . HERNIA REPAIR     umbilicus (below)   . JOINT REPLACEMENT Bilateral    total hip arthroplasty  . KIDNEY STONE SURGERY    . LITHOTRIPSY    . LUMBAR LAMINECTOMY/DECOMPRESSION MICRODISCECTOMY  01/22/2012   Procedure: LUMBAR LAMINECTOMY/DECOMPRESSION MICRODISCECTOMY 1 LEVEL;  Surgeon: Ophelia Charter, MD;  Location: [facility];  Service: Neurosurgery;  Laterality: N/A;  Lumbar two and lumbar three laminectomies  . ORCHIECTOMY Bilateral 10/16/2017   Procedure: ORCHIECTOMY;  Surgeon: Royston Cowper, MD;  Location: [facility];  Service: Urology;  Laterality: Bilateral;  . TONSILLECTOMY    . TOTAL HIP ARTHROPLASTY     bilat    SOCIAL HISTORY: Patient goes around with a wheelchair-secondary to arthritis/debility.  He lives by himself.  His friends help him with outside chores.  Remote history of smoking no alcohol. Social History   Socioeconomic History  . Marital status: Widowed    Spouse name: Not on file  . Number of children: Not on file  . Years of education: Not on file  . Highest education level: Not on file  Occupational History  . Occupation: retired  Scientific laboratory technician  . Financial resource strain: Not on file  . Food insecurity:    Worry: Not on file    Inability: Not on file  . Transportation needs:    Medical: Not on file    Non-medical: Not on file  Tobacco Use  . Smoking status: Former Smoker    Packs/day: 1.00    Years: 10.00    Pack years: 10.00    Last attempt to quit: 05/16/1963    Years since quitting: 54.7  . Smokeless tobacco: Never Used  . Tobacco comment: quit 1960  Substance and Sexual Activity  . Alcohol use: Yes    Alcohol/week: 0.0 standard drinks    Comment: very rarely  . Drug use: No  . Sexual activity: Not Currently  Lifestyle  . Physical activity:    Days per week: Not on file    Minutes per session: Not on file  . Stress: Not on file  Relationships  . Social connections:    Talks on phone: Not on file    Gets together:  Not on file    Attends religious service: Not on file    Active member of club or organization: Not on file    Attends meetings of clubs or organizations: Not on file    Relationship status: Not on file  . Intimate partner violence:    Fear of current or ex partner: Not on file    Emotionally abused: Not on file    Physically abused: Not on file    Forced sexual activity: Not on file  Other Topics Concern  . Not on file  Social History Narrative  . Not on file    FAMILY HISTORY: Family History  Problem Relation Age of Onset  . Kidney Stones Unknown   . Kidney disease Neg Hx   . Prostate cancer Neg Hx     ALLERGIES:  is allergic to ciprofloxacin; penicillins; allopurinol; pantoprazole; rofecoxib; and tramadol.  MEDICATIONS:  Current Outpatient Medications  Medication Sig Dispense Refill  .  amLODipine (NORVASC) 5 MG tablet Take 5 mg by mouth daily.  2  . bicalutamide (CASODEX) 50 MG tablet Take 50 mg by mouth every evening.     . Cyanocobalamin (VITAMIN B 12 PO) Take 1 tablet by mouth every other day.     . fluorouracil (EFUDEX) 5 % cream Apply twice daily for 3 weeks to pre-cancer spots on the forehead and right scalp    . Misc Natural Products (TART CHERRY ADVANCED PO) Take 1 capsule by mouth daily as needed (gout pain).    . Multiple Vitamin (MULTIVITAMIN WITH MINERALS) TABS tablet Take 1 tablet by mouth daily.    Marland Kitchen senna (SENOKOT) 8.6 MG tablet Take 1 tablet by mouth daily.    . tamsulosin (FLOMAX) 0.4 MG CAPS capsule Take 0.4 mg by mouth every evening.     Marland Kitchen acetaminophen-codeine (TYLENOL #3) 300-30 MG tablet Take 1-2 tablets by mouth every 4 (four) hours as needed for moderate pain. (Patient not taking: Reported on 02/01/2018) 20 tablet 2  . magnesium hydroxide (MILK OF MAGNESIA) 400 MG/5ML suspension Take 15 mLs by mouth daily as needed for indigestion.     . mupirocin ointment (BACTROBAN) 2 % Apply topically.     Current Facility-Administered Medications  Medication  Dose Route Frequency Provider Last Rate Last Dose  . polyethylene glycol (MIRALAX / GLYCOLAX) packet 17 g  17 g Oral Daily Jonathon Bellows, MD          .  PHYSICAL EXAMINATION: ECOG PERFORMANCE STATUS: 2 - Symptomatic, <50% confined to bed  Vitals:   02/01/18 1527  BP: 138/61  Pulse: 67  Resp: 18  Temp: (!) 97.5 F (36.4 C)   Filed Weights   02/01/18 1527  Weight: 205 lb 6.4 oz (93.2 kg)    Physical Exam  Constitutional: He is oriented to person, place, and time.  Elderly frail appearing male patient; in a wheelchair.  Accompanied by a neighbor/friend.  HENT:  Head: Normocephalic and atraumatic.  Mouth/Throat: Oropharynx is clear and moist. No oropharyngeal exudate.  Eyes: Pupils are equal, round, and reactive to light.  Neck: Normal range of motion. Neck supple.  Cardiovascular: Normal rate and regular rhythm.  Pulmonary/Chest: No respiratory distress. He has no wheezes.  Abdominal: Soft. Bowel sounds are normal. He exhibits no distension and no mass. There is no tenderness. There is no rebound and no guarding.  Musculoskeletal: Normal range of motion. He exhibits no edema or tenderness.  Neurological: He is alert and oriented to person, place, and time.  Skin: Skin is warm.  Psychiatric: Affect normal.     LABORATORY DATA:  I have reviewed the data as listed Lab Results  Component Value Date   WBC 7.4 06/29/2017   HGB 11.9 (L) 10/16/2017   HCT 35.0 (L) 10/16/2017   MCV 93.7 06/29/2017   PLT 235 06/29/2017   Recent Labs    06/29/17 0931 08/14/17 1106 08/31/17 1012 10/16/17 1538  NA 137  --  138 140  K 5.1  --  4.1 5.1  CL 105  --  105  --   CO2 23  --  27  --   GLUCOSE 137*  --  96 111*  BUN 37*  --  31*  --   CREATININE 1.33* 1.40* 1.35*  --   CALCIUM 8.6*  --  8.5*  --   GFRNONAA 44*  --  44*  --   GFRAA 51*  --  50*  --   PROT 7.4  --   --   --  ALBUMIN 3.5  --   --   --   AST 20  --   --   --   ALT 11*  --   --   --   ALKPHOS 68  --   --   --    BILITOT 0.8  --   --   --     RADIOGRAPHIC STUDIES: I have personally reviewed the radiological images as listed and agreed with the findings in the report. Ct Abdomen Pelvis W Wo Contrast  Result Date: 01/25/2018 CLINICAL DATA:  Patient with difficult urination. Recent diagnosis prostate cancer. EXAM: CT ABDOMEN AND PELVIS WITHOUT AND WITH CONTRAST TECHNIQUE: Multidetector CT imaging of the abdomen and pelvis was performed following the standard protocol before and following the bolus administration of intravenous contrast. CONTRAST:  154mL OMNIPAQUE IOHEXOL 300 MG/ML  SOLN COMPARISON:  CT abdomen pelvis 08/14/2017 FINDINGS: Lower chest: Heart is mildly enlarged. Dependent atelectasis within the bilateral lower lobes. Stable 5 mm subpleural right lower lobe nodule (image 1; series 3). Hepatobiliary: Liver is normal in size and contour. No focal lesion is identified. Gallbladder is decompressed. No intrahepatic or extrahepatic biliary ductal dilatation. Pancreas: Unremarkable Spleen: Unremarkable Adrenals/Urinary Tract: Stable mild nodularity of the left adrenal gland. Normal right adrenal gland. Noncontrast images demonstrate a 3 mm stone inferior pole right kidney and an adjacent 2 mm stone inferior pole right kidney. Within the inferior pole of the left kidney there is a 9 mm partially exophytic lesion (image 33; series 9) which demonstrates contrast enhancement. There are multiple cysts bilaterally. Additionally, there multiple subcentimeter low-attenuation renal lesions which are too small to characterize. No ureterolithiasis. No hydronephrosis. Urinary bladder is decompressed with a Foley catheter. There is a large enhancing mass in the pelvis which appears to invade/involve the posterior left lateral aspect of the urinary bladder (image 65; series 2). Circumferential wall thickening of the urinary bladder with surrounding fat stranding. Stomach/Bowel: Extensive mass effect of the rectum to the left  aspect of the pelvis with possible invasion from the adjacent pelvic mass. No evidence for upstream bowel obstruction. Normal morphology of the stomach. No free fluid or free intraperitoneal air. Vascular/Lymphatic: Peripheral calcified atherosclerotic plaque involving the abdominal aorta. Unchanged 1.4 cm left periaortic lymph node (image 42; series 9). Interval increase in size of right pelvic sidewall lymph node measuring 2.2 cm (image 63; series 9), previously 1.6 cm. Reproductive: Interval increase in size of heterogeneously enhancing mass involving the pelvis measuring 8.3 x 10.2 cm (image 60; series 9), previously 0.7 x 5.7 cm. There is mass effect on the adjacent rectum, seminal vesicles and urinary bladder with suggestion of localized invasion of these structures. Other: None. Musculoskeletal: Bilateral hip arthroplasties. Lumbar and thoracic spine degenerative changes. Similar-appearing small sclerotic lesion within the right ninth rib (image 21; series 9). Artifact limits evaluation of the pelvis however there is suggestion of a possible lucent lesion within the left anterior pubic ramus (image 56; series 11). IMPRESSION: 1. Interval increase in size of large heterogeneously enhancing pelvic mass which exerts mass effect on and appears to invade the adjacent rectum, bladder and seminal vesicles. Interval increase in size of pelvic sidewall adenopathy. 2. Limited secondary to streak artifact from hip arthroplasties however there is a possible lucent lesion within the left superior pubic ramus. Recommend attention on follow-up. 3. Similar size enhancing nodule within the inferior pole of the left kidney. 4. Right-sided nephrolithiasis. 5. Cholelithiasis. Electronically Signed   By: Lovey Newcomer M.D.   On:  01/25/2018 15:35    ASSESSMENT & PLAN:   Prostate cancer [facility]) # Prostate cancer- STAGE IV; castrate resistant [status post orchiectomy in June 2019]; bulky pelvic mass with possible involvement of  the rectum.   #Long discussion the patient regarding regarding unfortunate absence of cure for his prostate cancer.  However palliative treatments with additional antihormone therapy would be recommended if patient is interested in response.   #I would recommend apalutamide [240 mg 4 pills a day]; for palliation of symptoms.  Discussed the potential side effects in detail.  Medication samples were given.  I would recommend starting patient on Xtandi [160 mg; 4 pills a day].  Again reviewed the potential side effects.  Prescription started.  Commend stopping Casodex. Dr.Wolfe recommends holding off radiation for now.   # urinary retention s/p foley-defer to urology Dr. Eliberto Ivory regarding recommendations for explantation.  I think given patient situation he will need long-term Foley catheter.  # constipation-secondary to rectal involvement of the tumor.  Recommend MiraLAX.   #Goals of care: Treatment understands treatments are palliative not curative.  Patient awaiting evaluation with hospice/palliative care as outpatient.  I recommended the patient to keep that appointment for now.  # follow up in 2 weeks-cbc/cmp/PSA.  Thank you Dr.Wolfe for allowing me to participate in the care of your pleasant patient. Please do not hesitate to contact me with questions or concerns in the interim. Discussed with Dr.Wolffe.   Cc; Dr.Wolfe; Dr.Johnston.   All questions were answered. The patient knows to call the clinic with any problems, questions or concerns.    Cammie Sickle, MD 02/04/2018 9:50 AM

## 2018-02-01 NOTE — Progress Notes (Signed)
Here for new pt evaluation. Per friend Cheryl Stabenow - Dr Wynetta Emery has ordered a hospice evaluation next Tuesday 02/11/18.

## 2018-02-01 NOTE — Patient Instructions (Signed)
STOP CASODEX  S

## 2018-02-01 NOTE — Assessment & Plan Note (Addendum)
#   Prostate cancer- STAGE IV; castrate resistant [status post orchiectomy in June 2019]; bulky pelvic mass with possible involvement of the rectum.   #Long discussion the patient regarding regarding unfortunate absence of cure for his prostate cancer.  However palliative treatments with additional antihormone therapy would be recommended if patient is interested in response.   #I would recommend apalutamide [240 mg 4 pills a day]; for palliation of symptoms.  Discussed the potential side effects in detail.  Medication samples were given.  I would recommend starting patient on Xtandi [160 mg; 4 pills a day].  Again reviewed the potential side effects.  Prescription started.  Commend stopping Casodex. Dr.Wolfe recommends holding off radiation for now.   # urinary retention s/p foley-defer to urology Dr. Eliberto Ivory regarding recommendations for explantation.  I think given patient situation he will need long-term Foley catheter.  # constipation-secondary to rectal involvement of the tumor.  Recommend MiraLAX.   #Goals of care: Treatment understands treatments are palliative not curative.  Patient awaiting evaluation with hospice/palliative care as outpatient.  I recommended the patient to keep that appointment for now.  # follow up in 2 weeks-cbc/cmp/PSA.  Thank you Dr.Wolfe for allowing me to participate in the care of your pleasant patient. Please do not hesitate to contact me with questions or concerns in the interim. Discussed with Dr.Wolffe.   Cc; Dr.Wolfe; Dr.Johnston.

## 2018-02-04 ENCOUNTER — Telehealth: Payer: Self-pay

## 2018-02-04 ENCOUNTER — Telehealth: Payer: Self-pay | Admitting: Pharmacy Technician

## 2018-02-04 ENCOUNTER — Other Ambulatory Visit: Payer: Self-pay | Admitting: Pharmacist

## 2018-02-04 DIAGNOSIS — Z7189 Other specified counseling: Secondary | ICD-10-CM | POA: Insufficient documentation

## 2018-02-04 DIAGNOSIS — C61 Malignant neoplasm of prostate: Secondary | ICD-10-CM

## 2018-02-04 MED ORDER — ENZALUTAMIDE 40 MG PO CAPS: 160.0000 mg | ORAL_CAPSULE | Freq: Every day | ORAL | 2 refills | Status: DC

## 2018-02-04 NOTE — Telephone Encounter (Signed)
Oral Oncology Patient Advocate Encounter  Received notification from [employer] that prior authorization for Gillermina Phy is required.  PA submitted on CoverMyMeds Key [id number]  Status is pending  Oral Oncology Clinic will continue to follow.  [place] Patient [place] Phone [phone] Fax [phone] 02/04/2018 1:46 PM

## 2018-02-04 NOTE — Telephone Encounter (Signed)
Oral Chemotherapy Pharmacist Encounter  Dispensed samples to patient:  Medication: ERLEADA  Instructions: TAKE 4 TABLETS BY MOUTH DAILY. Quantity dispensed: 120 TABLETS Days supply: 30 Manufacturer: Suszanne Finch: [id number] Exp: 03/2018  Darl Pikes, PharmD, BCPS, BCOP Hematology/Oncology Clinical Pharmacist [facility] [phone]  02/04/2018 3:17 PM

## 2018-02-04 NOTE — Telephone Encounter (Signed)
Oral Oncology Patient Advocate Encounter  Prior Authorization for Gillermina Phy has been approved.    PA# [id number] Effective dates: 02/04/18 through 05/14/18  Patients co-pay is $2353.05  Oral Oncology Clinic will continue to follow.   [place] Patient [place] Phone [phone] Fax [phone] 02/04/2018 2:05 PM

## 2018-02-04 NOTE — Telephone Encounter (Signed)
Oral Oncology Pharmacy-Student Encounter  Received new prescription for Xtandi (enzalutamide) for the treatment of metastatic, castrate resistant prostate cancer, planned duration until disease progression or intolerable side effects. Patient previously underwent surgical bilateral orchiectomy in June 2019 rather than Lupron therapy due to unaffordable medication copay.  Labs from 02/01/2018 assessed, baseline BP is 138/61, pt currently on amlodipine for BP control. LFT's from 11/21/2017 are WNL. Prescription dose and frequency assessed.   Current medication list in Epic reviewed, DDIs with Tylenol #3 identified: possible decreased codeine efficacy. Recommend monitoring efficacy of Tylenol #3 and adjust dose if necessary for pain control.  Prescription has been e-scribed to the [facility] for benefits analysis and approval.  Oral Oncology Clinic will continue to follow for insurance authorization, copayment issues, initial counseling and start date.  Jannifer Rodney, Pharmacy Student  02/04/2018 10:04 AM

## 2018-02-06 ENCOUNTER — Telehealth: Payer: Self-pay | Admitting: *Deleted

## 2018-02-06 NOTE — Telephone Encounter (Signed)
Hospice form has been signed and faxed.

## 2018-02-06 NOTE — Telephone Encounter (Signed)
Palliative Care saw patient and is recommending Hospice services. They are also requesting a copy of physician last note

## 2018-02-07 NOTE — Telephone Encounter (Signed)
Oral Chemotherapy Pharmacist Encounter   Patient has decided to proceed with Hospice Care.  Darl Pikes, PharmD, BCPS, [facility] Hematology/Oncology Clinical Pharmacist [facility] [phone]  02/07/2018 11:36 AM

## 2018-02-15 ENCOUNTER — Inpatient Hospital Stay: Attending: Internal Medicine

## 2018-02-15 ENCOUNTER — Inpatient Hospital Stay (HOSPITAL_BASED_OUTPATIENT_CLINIC_OR_DEPARTMENT_OTHER): Admitting: Internal Medicine

## 2018-02-15 ENCOUNTER — Other Ambulatory Visit: Payer: Self-pay

## 2018-02-15 ENCOUNTER — Telehealth: Payer: Self-pay | Admitting: *Deleted

## 2018-02-15 VITALS — BP 126/67 | HR 58 | Temp 98.6°F | Resp 20 | Ht 64.7 in | Wt 199.0 lb

## 2018-02-15 DIAGNOSIS — M109 Gout, unspecified: Secondary | ICD-10-CM | POA: Diagnosis not present

## 2018-02-15 DIAGNOSIS — Z79899 Other long term (current) drug therapy: Secondary | ICD-10-CM | POA: Insufficient documentation

## 2018-02-15 DIAGNOSIS — C61 Malignant neoplasm of prostate: Secondary | ICD-10-CM | POA: Diagnosis not present

## 2018-02-15 DIAGNOSIS — M810 Age-related osteoporosis without current pathological fracture: Secondary | ICD-10-CM | POA: Insufficient documentation

## 2018-02-15 DIAGNOSIS — I10 Essential (primary) hypertension: Secondary | ICD-10-CM | POA: Diagnosis not present

## 2018-02-15 DIAGNOSIS — R338 Other retention of urine: Secondary | ICD-10-CM

## 2018-02-15 DIAGNOSIS — C785 Secondary malignant neoplasm of large intestine and rectum: Secondary | ICD-10-CM

## 2018-02-15 DIAGNOSIS — C7911 Secondary malignant neoplasm of bladder: Secondary | ICD-10-CM | POA: Diagnosis not present

## 2018-02-15 DIAGNOSIS — K8020 Calculus of gallbladder without cholecystitis without obstruction: Secondary | ICD-10-CM

## 2018-02-15 DIAGNOSIS — I2510 Atherosclerotic heart disease of native coronary artery without angina pectoris: Secondary | ICD-10-CM | POA: Insufficient documentation

## 2018-02-15 DIAGNOSIS — K5909 Other constipation: Secondary | ICD-10-CM

## 2018-02-15 DIAGNOSIS — N400 Enlarged prostate without lower urinary tract symptoms: Secondary | ICD-10-CM | POA: Diagnosis not present

## 2018-02-15 DIAGNOSIS — G4730 Sleep apnea, unspecified: Secondary | ICD-10-CM | POA: Diagnosis not present

## 2018-02-15 DIAGNOSIS — E538 Deficiency of other specified B group vitamins: Secondary | ICD-10-CM | POA: Insufficient documentation

## 2018-02-15 DIAGNOSIS — N401 Enlarged prostate with lower urinary tract symptoms: Secondary | ICD-10-CM

## 2018-02-15 DIAGNOSIS — C7951 Secondary malignant neoplasm of bone: Secondary | ICD-10-CM

## 2018-02-15 DIAGNOSIS — Z87891 Personal history of nicotine dependence: Secondary | ICD-10-CM

## 2018-02-15 DIAGNOSIS — E785 Hyperlipidemia, unspecified: Secondary | ICD-10-CM | POA: Insufficient documentation

## 2018-02-15 DIAGNOSIS — Z192 Hormone resistant malignancy status: Secondary | ICD-10-CM

## 2018-02-15 LAB — COMPREHENSIVE METABOLIC PANEL
ALT: 12 U/L (ref 0–44)
AST: 21 U/L (ref 15–41)
Albumin: 3 g/dL — ABNORMAL LOW (ref 3.5–5.0)
Alkaline Phosphatase: 58 U/L (ref 38–126)
Anion gap: 8 (ref 5–15)
BILIRUBIN TOTAL: 0.5 mg/dL (ref 0.3–1.2)
BUN: 19 mg/dL (ref 8–23)
CO2: 30 mmol/L (ref 22–32)
CREATININE: 1.2 mg/dL (ref 0.61–1.24)
Calcium: 9.2 mg/dL (ref 8.9–10.3)
Chloride: 103 mmol/L (ref 98–111)
GFR calc Af Amer: 58 mL/min — ABNORMAL LOW (ref >60)
GFR, EST NON AFRICAN AMERICAN: 50 mL/min — AB (ref >60)
Glucose, Bld: 156 mg/dL — ABNORMAL HIGH (ref 70–99)
POTASSIUM: 4.4 mmol/L (ref 3.5–5.1)
Sodium: 141 mmol/L (ref 135–145)
TOTAL PROTEIN: 7.2 g/dL (ref 6.5–8.1)

## 2018-02-15 LAB — CBC WITH DIFFERENTIAL/PLATELET
BASOS ABS: 0.1 10*3/uL (ref 0–0.1)
BASOS PCT: 1 %
Eosinophils Absolute: 0.4 10*3/uL (ref 0–0.7)
Eosinophils Relative: 8 %
HCT: 31.9 % — ABNORMAL LOW (ref 40.0–52.0)
HEMOGLOBIN: 11 g/dL — AB (ref 13.0–18.0)
Lymphocytes Relative: 21 %
Lymphs Abs: 1 10*3/uL (ref 1.0–3.6)
MCH: 31.5 pg (ref 26.0–34.0)
MCHC: 34.4 g/dL (ref 32.0–36.0)
MCV: 91.6 fL (ref 80.0–100.0)
MONOS PCT: 8 %
Monocytes Absolute: 0.4 10*3/uL (ref 0.2–1.0)
NEUTROS ABS: 2.9 10*3/uL (ref 1.4–6.5)
NEUTROS PCT: 62 %
Platelets: 276 10*3/uL (ref 150–440)
RBC: 3.48 MIL/uL — AB (ref 4.40–5.90)
RDW: 13.9 % (ref 11.5–14.5)
WBC: 4.7 10*3/uL (ref 3.8–10.6)

## 2018-02-15 MED ORDER — SULFAMETHOXAZOLE-TRIMETHOPRIM 800-160 MG PO TABS: 1.0000 | ORAL_TABLET | Freq: Two times a day (BID) | ORAL | 0 refills | Status: DC

## 2018-02-15 NOTE — Assessment & Plan Note (Addendum)
#   Prostate cancer- STAGE IV; castrate resistant [status post orchiectomy in June 2019]; bulky pelvic mass with possible involvement of the rectum.   #Patient started on apalutamide; however declined further therapy.  He is currently enrolled in hospice.  I think is reasonable given his advanced age lack of good social support/in general incurable nature of the disease.  # urinary retention s/p foley-defer to urology Dr. Eliberto Ivory.; ? UTI/ Citrobacter; start Bactrim BID.  Defer to hospice/urology regarding change of Foley catheter.  # constipation-secondary to rectal involvement of the tumor.On [place] prn- improved.   # follow up as needed/continue hospice.   Cc; Dr.Wolfe/Dr.Jonhston.

## 2018-02-15 NOTE — Progress Notes (Signed)
[facility] NOTE  Patient Care Team: Baxter Hire, MD as PCP - General (Internal Medicine)  CHIEF COMPLAINTS/PURPOSE OF CONSULTATION:  Prostate cancer  #  Oncology History   # MARCH 2019 PROSTATE CANCER [Glason score: 5+5]; psa- 19;  s/p bil orchiectomy [june 2019; Dr.Wolfe];   # Progression/constipation- SEP 2019- large pelvic mass involving rectum/bladder; PA- 4.2.   # sep19th- Apalutamide [samples] 4/day.x2 days; stopped/hospice  # acute urinary retention s/p foley   DIAGNOSIS: CASTRATE RESISTANT PROSTATE CANCER  STAGE: IV  ;GOALS:Palliative  CURRENT/MOST RECENT THERAPY: Hospice      Prostate cancer ([place])     HISTORY OF PRESENTING ILLNESS:  Theodore Padilla 84 y.o.  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  Patient was started on apalutamide approximately 2 weeks ago he took for 2 days; but then he decided to go into hospice.  He is currently not taking apalutamide.  His constipation is improved.  Is currently on Senokot.   Patient noted to have cloudy urine had a UA/culture positive for Citrobacter.  No fever chills.  Back pain.  Review of Systems  Constitutional: Positive for malaise/fatigue and weight loss. Negative for chills, diaphoresis and fever.  HENT: Negative for nosebleeds and sore throat.   Eyes: Negative for double vision.  Respiratory: Negative for cough, hemoptysis, sputum production, shortness of breath and wheezing.   Cardiovascular: Negative for chest pain, palpitations, orthopnea and leg swelling.  Gastrointestinal: Positive for constipation. Negative for abdominal pain, blood in stool, diarrhea, heartburn, melena, nausea and vomiting.  Genitourinary: Negative for dysuria, frequency and urgency.  Musculoskeletal: Positive for back pain and joint pain.  Skin: Negative.  Negative for itching and rash.  Neurological: Negative for dizziness,  tingling, focal weakness, weakness and headaches.  Endo/Heme/Allergies: Does not bruise/bleed easily.  Psychiatric/Behavioral: Negative for depression. The patient is not nervous/anxious and does not have insomnia.      MEDICAL HISTORY:  Past Medical History:  Diagnosis Date  . Arthritis   . B12 deficiency   . BPH (benign prostatic hyperplasia)   . Chronic constipation   . Coronary artery disease    with stenting x 1  . Degeneration of spine   . Gout   . Hearing aid worn    bilateral  . History of heart artery stent 2002  . History of kidney stones   . HLD (hyperlipidemia)   . Hypertension   . Lower extremity edema 2019  . Moderate mitral insufficiency   . Moderate tricuspid insufficiency   . Osteoporosis   . Prostate cancer ([place])   . Shortness of breath    occasional  . Sleep apnea    no CPAP, pt denies OSA  . Spine deformity 2019   has severe back pain. previously had surgery without success for improvement  . Urinary hesitancy   . Urinary retention   . Wears dentures    partial upper and lower    SURGICAL HISTORY: Past Surgical History:  Procedure Laterality Date  . APPENDECTOMY    . BACK SURGERY    . CARDIAC CATHETERIZATION  2000   with stent  . CATARACT EXTRACTION W/PHACO Right 04/18/2017   Procedure: CATARACT EXTRACTION PHACO AND INTRAOCULAR LENS PLACEMENT ([place]) RIGHT;  Surgeon: Leandrew Koyanagi, MD;  Location: [facility];  Service: Ophthalmology;  Laterality: Right;  . CATARACT EXTRACTION W/PHACO Left 05/23/2017   Procedure: CATARACT EXTRACTION PHACO AND INTRAOCULAR LENS PLACEMENT ([facility]) LEFT;  Surgeon: Wallace Going,  Nila Nephew, MD;  Location: [facility];  Service: Ophthalmology;  Laterality: Left;  . ESOPHAGOGASTRODUODENOSCOPY (EGD) WITH PROPOFOL N/A 07/12/2016   Procedure: ESOPHAGOGASTRODUODENOSCOPY (EGD) WITH PROPOFOL;  Surgeon: Jonathon Bellows, MD;  Location: [facility];  Service: Endoscopy;  Laterality: N/A;  . FOREIGN BODY REMOVAL     L  knee  . HERNIA REPAIR     umbilicus (below)   . JOINT REPLACEMENT Bilateral    total hip arthroplasty  . KIDNEY STONE SURGERY    . LITHOTRIPSY    . LUMBAR LAMINECTOMY/DECOMPRESSION MICRODISCECTOMY  01/22/2012   Procedure: LUMBAR LAMINECTOMY/DECOMPRESSION MICRODISCECTOMY 1 LEVEL;  Surgeon: Ophelia Charter, MD;  Location: [facility];  Service: Neurosurgery;  Laterality: N/A;  Lumbar two and lumbar three laminectomies  . ORCHIECTOMY Bilateral 10/16/2017   Procedure: ORCHIECTOMY;  Surgeon: Royston Cowper, MD;  Location: [facility];  Service: Urology;  Laterality: Bilateral;  . TONSILLECTOMY    . TOTAL HIP ARTHROPLASTY     bilat    SOCIAL HISTORY: Patient goes around with a wheelchair-secondary to arthritis/debility.  He lives by himself.  His friends help him with outside chores.  Remote history of smoking no alcohol. Social History   Socioeconomic History  . Marital status: Widowed    Spouse name: Not on file  . Number of children: Not on file  . Years of education: Not on file  . Highest education level: Not on file  Occupational History  . Occupation: retired  Scientific laboratory technician  . Financial resource strain: Not on file  . Food insecurity:    Worry: Not on file    Inability: Not on file  . Transportation needs:    Medical: Not on file    Non-medical: Not on file  Tobacco Use  . Smoking status: Former Smoker    Packs/day: 1.00    Years: 10.00    Pack years: 10.00    Last attempt to quit: 05/16/1963    Years since quitting: 54.7  . Smokeless tobacco: Never Used  . Tobacco comment: quit 1960  Substance and Sexual Activity  . Alcohol use: Yes    Alcohol/week: 0.0 standard drinks    Comment: very rarely  . Drug use: No  . Sexual activity: Not Currently  Lifestyle  . Physical activity:    Days per week: Not on file    Minutes per session: Not on file  . Stress: Not on file  Relationships  . Social connections:    Talks on phone: Not on file    Gets together: Not on file     Attends religious service: Not on file    Active member of club or organization: Not on file    Attends meetings of clubs or organizations: Not on file    Relationship status: Not on file  . Intimate partner violence:    Fear of current or ex partner: Not on file    Emotionally abused: Not on file    Physically abused: Not on file    Forced sexual activity: Not on file  Other Topics Concern  . Not on file  Social History Narrative  . Not on file    FAMILY HISTORY: Family History  Problem Relation Age of Onset  . Kidney Stones Unknown   . Kidney disease Neg Hx   . Prostate cancer Neg Hx     ALLERGIES:  is allergic to ciprofloxacin; penicillins; allopurinol; pantoprazole; rofecoxib; and tramadol.  MEDICATIONS:  Current Outpatient Medications  Medication Sig Dispense Refill  .  Misc Natural Products (TART CHERRY ADVANCED PO) Take 1 capsule by mouth daily as needed (gout pain).    . Multiple Vitamin (MULTIVITAMIN WITH MINERALS) TABS tablet Take 1 tablet by mouth daily.    . hydrochlorothiazide (HYDRODIURIL) 12.5 MG tablet Take 12.5 mg by mouth daily.  11  . sulfamethoxazole-trimethoprim (BACTRIM DS,SEPTRA DS) 800-160 MG tablet Take 1 tablet by mouth 2 (two) times daily. 10 tablet 0   Current Facility-Administered Medications  Medication Dose Route Frequency Provider Last Rate Last Dose  . polyethylene glycol (MIRALAX / GLYCOLAX) packet 17 g  17 g Oral Daily Jonathon Bellows, MD          .  PHYSICAL EXAMINATION: ECOG PERFORMANCE STATUS: 2 - Symptomatic, <50% confined to bed  Vitals:   02/15/18 0830  BP: 126/67  Pulse: (!) 58  Resp: 20  Temp: 98.6 F (37 C)   Filed Weights   02/15/18 0840  Weight: 199 lb (90.3 kg)    Physical Exam  Constitutional: He is oriented to person, place, and time.  Elderly frail appearing male patient; in a wheelchair.  Accompanied by a neighbor/friend.  HENT:  Head: Normocephalic and atraumatic.  Mouth/Throat: Oropharynx is clear and moist.  No oropharyngeal exudate.  Eyes: Pupils are equal, round, and reactive to light.  Neck: Normal range of motion. Neck supple.  Cardiovascular: Normal rate and regular rhythm.  Pulmonary/Chest: No respiratory distress. He has no wheezes.  Abdominal: Soft. Bowel sounds are normal. He exhibits no distension and no mass. There is no tenderness. There is no rebound and no guarding.  Musculoskeletal: Normal range of motion. He exhibits no edema or tenderness.  Neurological: He is alert and oriented to person, place, and time.  Skin: Skin is warm.  Psychiatric: Affect normal.     LABORATORY DATA:  I have reviewed the data as listed Lab Results  Component Value Date   WBC 4.7 02/15/2018   HGB 11.0 (L) 02/15/2018   HCT 31.9 (L) 02/15/2018   MCV 91.6 02/15/2018   PLT 276 02/15/2018   Recent Labs    06/29/17 0931 08/14/17 1106 08/31/17 1012 10/16/17 1538 02/15/18 0816  NA 137  --  138 140 141  K 5.1  --  4.1 5.1 4.4  CL 105  --  105  --  103  CO2 23  --  27  --  30  GLUCOSE 137*  --  96 111* 156*  BUN 37*  --  31*  --  19  CREATININE 1.33* 1.40* 1.35*  --  1.20  CALCIUM 8.6*  --  8.5*  --  9.2  GFRNONAA 44*  --  44*  --  50*  GFRAA 51*  --  50*  --  58*  PROT 7.4  --   --   --  7.2  ALBUMIN 3.5  --   --   --  3.0*  AST 20  --   --   --  21  ALT 11*  --   --   --  12  ALKPHOS 68  --   --   --  58  BILITOT 0.8  --   --   --  0.5    RADIOGRAPHIC STUDIES: I have personally reviewed the radiological images as listed and agreed with the findings in the report. Ct Abdomen Pelvis W Wo Contrast  Result Date: 01/25/2018 CLINICAL DATA:  Patient with difficult urination. Recent diagnosis prostate cancer. EXAM: CT ABDOMEN AND PELVIS WITHOUT AND WITH CONTRAST TECHNIQUE:  Multidetector CT imaging of the abdomen and pelvis was performed following the standard protocol before and following the bolus administration of intravenous contrast. CONTRAST:  194mL OMNIPAQUE IOHEXOL 300 MG/ML  SOLN  COMPARISON:  CT abdomen pelvis 08/14/2017 FINDINGS: Lower chest: Heart is mildly enlarged. Dependent atelectasis within the bilateral lower lobes. Stable 5 mm subpleural right lower lobe nodule (image 1; series 3). Hepatobiliary: Liver is normal in size and contour. No focal lesion is identified. Gallbladder is decompressed. No intrahepatic or extrahepatic biliary ductal dilatation. Pancreas: Unremarkable Spleen: Unremarkable Adrenals/Urinary Tract: Stable mild nodularity of the left adrenal gland. Normal right adrenal gland. Noncontrast images demonstrate a 3 mm stone inferior pole right kidney and an adjacent 2 mm stone inferior pole right kidney. Within the inferior pole of the left kidney there is a 9 mm partially exophytic lesion (image 33; series 9) which demonstrates contrast enhancement. There are multiple cysts bilaterally. Additionally, there multiple subcentimeter low-attenuation renal lesions which are too small to characterize. No ureterolithiasis. No hydronephrosis. Urinary bladder is decompressed with a Foley catheter. There is a large enhancing mass in the pelvis which appears to invade/involve the posterior left lateral aspect of the urinary bladder (image 65; series 2). Circumferential wall thickening of the urinary bladder with surrounding fat stranding. Stomach/Bowel: Extensive mass effect of the rectum to the left aspect of the pelvis with possible invasion from the adjacent pelvic mass. No evidence for upstream bowel obstruction. Normal morphology of the stomach. No free fluid or free intraperitoneal air. Vascular/Lymphatic: Peripheral calcified atherosclerotic plaque involving the abdominal aorta. Unchanged 1.4 cm left periaortic lymph node (image 42; series 9). Interval increase in size of right pelvic sidewall lymph node measuring 2.2 cm (image 63; series 9), previously 1.6 cm. Reproductive: Interval increase in size of heterogeneously enhancing mass involving the pelvis measuring 8.3 x 10.2  cm (image 60; series 9), previously 0.7 x 5.7 cm. There is mass effect on the adjacent rectum, seminal vesicles and urinary bladder with suggestion of localized invasion of these structures. Other: None. Musculoskeletal: Bilateral hip arthroplasties. Lumbar and thoracic spine degenerative changes. Similar-appearing small sclerotic lesion within the right ninth rib (image 21; series 9). Artifact limits evaluation of the pelvis however there is suggestion of a possible lucent lesion within the left anterior pubic ramus (image 56; series 11). IMPRESSION: 1. Interval increase in size of large heterogeneously enhancing pelvic mass which exerts mass effect on and appears to invade the adjacent rectum, bladder and seminal vesicles. Interval increase in size of pelvic sidewall adenopathy. 2. Limited secondary to streak artifact from hip arthroplasties however there is a possible lucent lesion within the left superior pubic ramus. Recommend attention on follow-up. 3. Similar size enhancing nodule within the inferior pole of the left kidney. 4. Right-sided nephrolithiasis. 5. Cholelithiasis. Electronically Signed   By: Lovey Newcomer M.D.   On: 01/25/2018 15:35    ASSESSMENT & PLAN:   Prostate cancer [facility]) # Prostate cancer- STAGE IV; castrate resistant [status post orchiectomy in June 2019]; bulky pelvic mass with possible involvement of the rectum.   #Patient started on apalutamide; however declined further therapy.  He is currently enrolled in hospice.  I think is reasonable given his advanced age lack of good social support/in general incurable nature of the disease.  # urinary retention s/p foley-defer to urology Dr. Eliberto Ivory.; ? UTI/ Citrobacter; start Bactrim BID.  Defer to hospice/urology regarding change of Foley catheter.  # constipation-secondary to rectal involvement of the tumor.On S[place]ot prn- improved.   #  follow up as needed/continue hospice.   Cc; Dr.Wolfe/Dr.Jonhston.   All questions were  answered. The patient knows to call the clinic with any problems, questions or concerns.    Cammie Sickle, MD 02/15/2018 9:07 AM

## 2018-02-15 NOTE — Telephone Encounter (Signed)
Mitzi asked if we got results of UA, C&S. Per Dr Richardean Canal was ordered, Mitzi informed

## 2018-02-16 LAB — PSA: Prostatic Specific Antigen: 1.9 ng/mL (ref 0.00–4.00)

## 2018-02-28 IMAGING — DX DG CHEST 1V PORT
1 series · 1 of 1 positions shown · non-contrast
Comparison: 09/11/2013

CLINICAL DATA: Shortness of Breath

EXAM:
PORTABLE CHEST 1 VIEW

[chest ap]
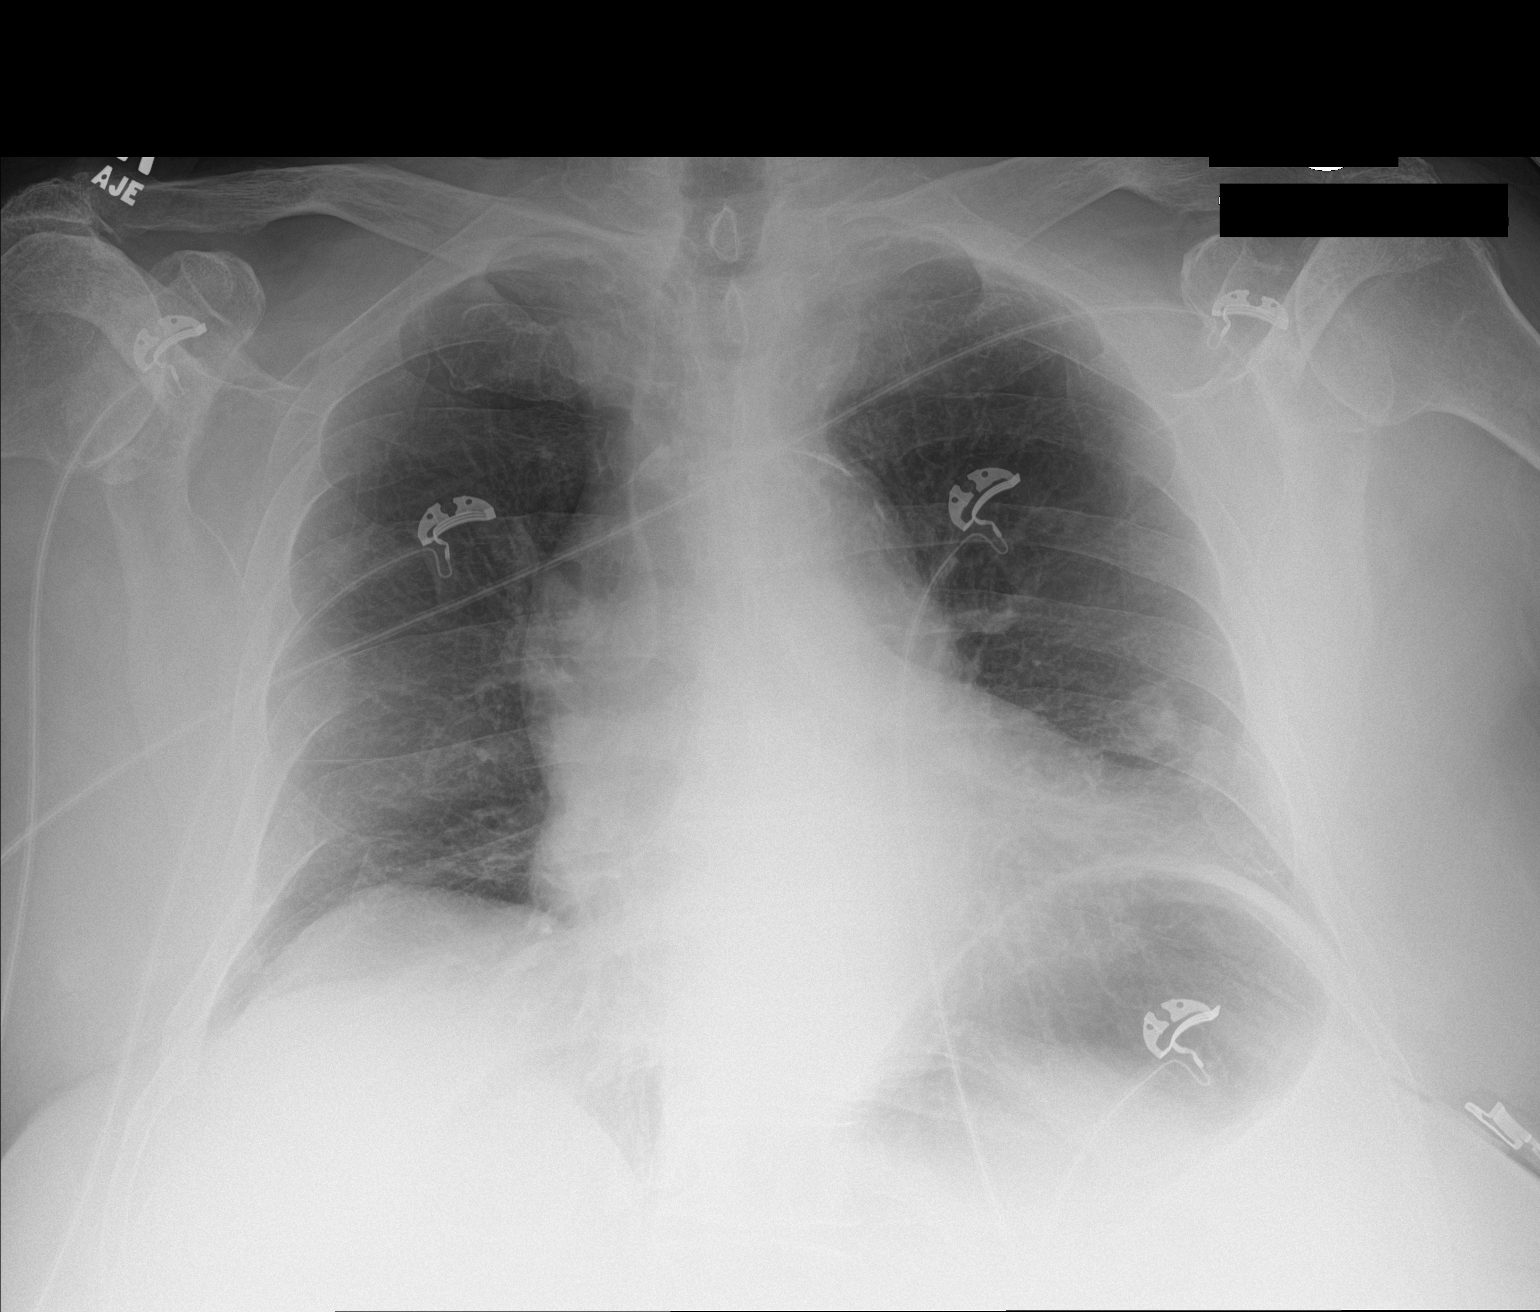

[1 of 1 positions shown; findings below may reference images not displayed]

FINDINGS: Cardiac shadow is mildly enlarged but stable. Aortic calcifications
are again seen. Lungs are well aerated bilaterally. Stable nodular
density is noted in the left base dating back to 3966 consistent
with a benign etiology. No focal infiltrate or sizable effusion is
seen. No bony abnormality is noted.
IMPRESSION: No acute abnormality seen.

## 2018-03-01 IMAGING — DX DG ABDOMEN 1V
2 series · 2 of 2 positions shown · non-contrast
Comparison: 09/02/2014 CT.

CLINICAL DATA: [AGE] male with constipation. Abdominal pain
and distention. Initial encounter.

EXAM:
ABDOMEN - 1 VIEW

[abdomen kub (1 of 2)]
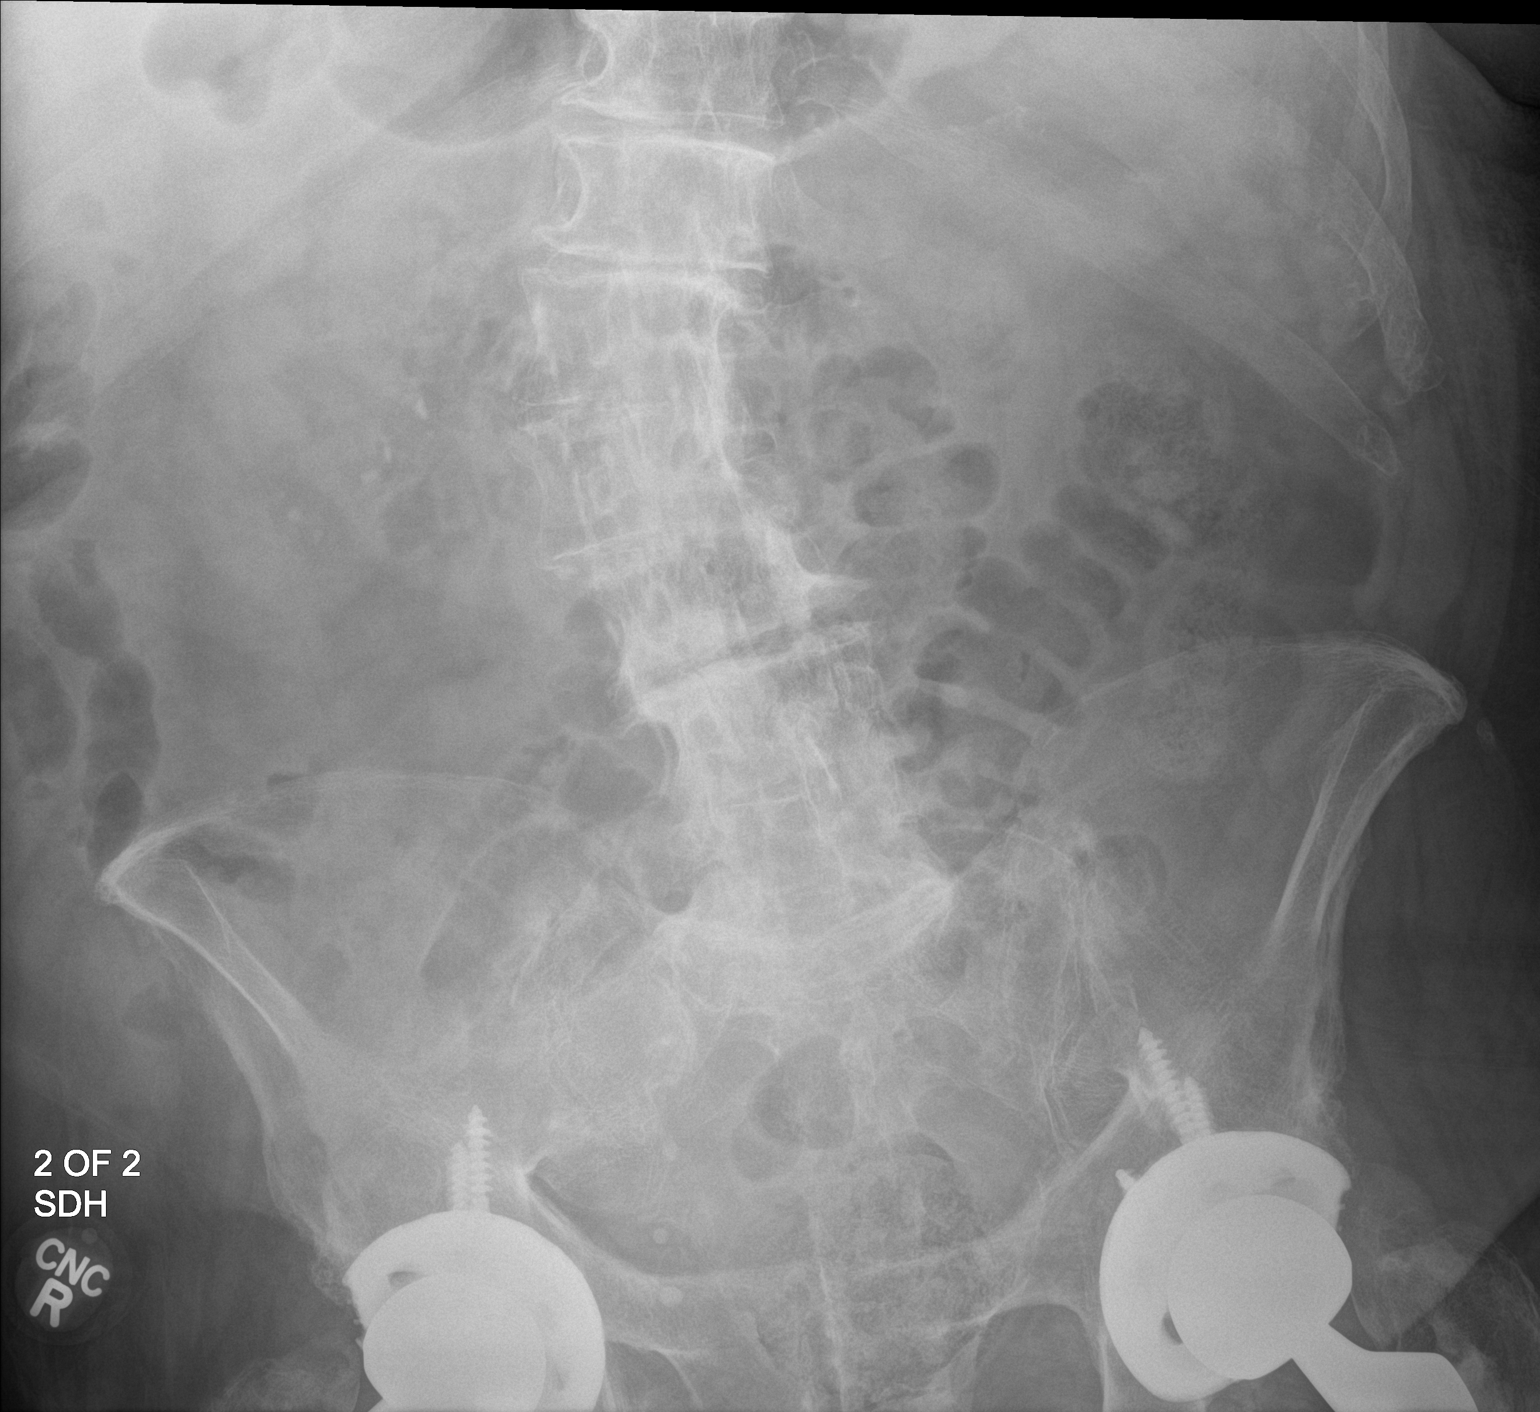

[abdomen kub (2 of 2)]
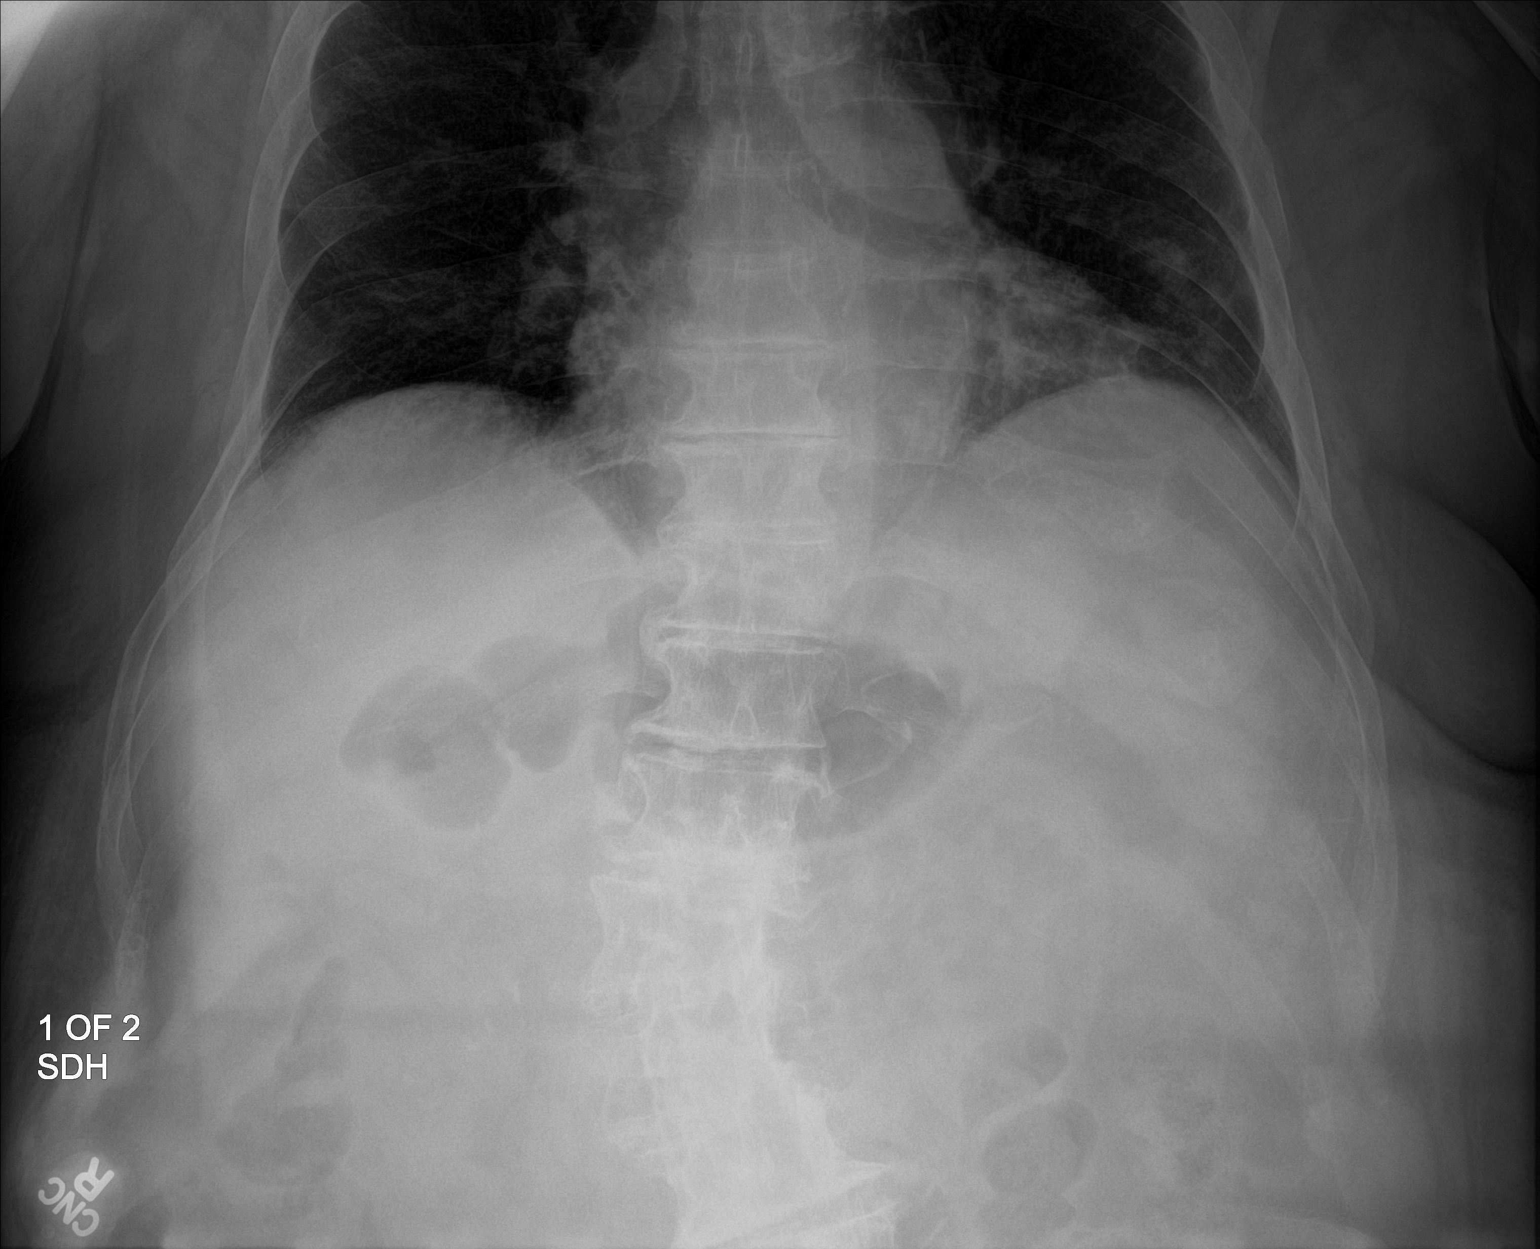

[2 of 2 positions shown; findings below may reference images not displayed]

FINDINGS: Moderate stool rectosigmoid region. Mild stool transverse colon and
descending colon.

No plain film evidence of small bowel obstruction.

The possibility of free intraperitoneal air cannot be assessed on a
supine view.

Vascular calcifications.  Suggestion of right renal calculi.

Scoliosis lumbar spine convex right.

Post bilateral hip replacement.
IMPRESSION: Moderate stool rectosigmoid region. Mild stool transverse colon and
descending colon.

No plain film evidence of small bowel obstruction.

## 2018-05-29 ENCOUNTER — Telehealth: Payer: Self-pay | Admitting: *Deleted

## 2018-05-29 NOTE — Telephone Encounter (Signed)
Mitzi informed

## 2018-05-29 NOTE — Telephone Encounter (Signed)
Per Dr. Jacinto Reap, this is ok.

## 2018-05-29 NOTE — Telephone Encounter (Signed)
Patient allergic to Cipro, [facility] recommends Bactrim DS twice a day times 7 days. Please advise

## 2018-05-30 ENCOUNTER — Telehealth: Payer: Self-pay | Admitting: Internal Medicine

## 2018-05-30 ENCOUNTER — Inpatient Hospital Stay: Attending: Internal Medicine | Admitting: Internal Medicine

## 2018-05-30 ENCOUNTER — Other Ambulatory Visit: Payer: Self-pay

## 2018-05-30 ENCOUNTER — Encounter: Payer: Self-pay | Admitting: Internal Medicine

## 2018-05-30 VITALS — BP 113/71 | HR 76 | Temp 97.9°F | Resp 16 | Ht 64.75 in | Wt 186.7 lb

## 2018-05-30 DIAGNOSIS — I10 Essential (primary) hypertension: Secondary | ICD-10-CM | POA: Diagnosis not present

## 2018-05-30 DIAGNOSIS — E785 Hyperlipidemia, unspecified: Secondary | ICD-10-CM | POA: Diagnosis not present

## 2018-05-30 DIAGNOSIS — I2510 Atherosclerotic heart disease of native coronary artery without angina pectoris: Secondary | ICD-10-CM | POA: Insufficient documentation

## 2018-05-30 DIAGNOSIS — M129 Arthropathy, unspecified: Secondary | ICD-10-CM | POA: Diagnosis not present

## 2018-05-30 DIAGNOSIS — N400 Enlarged prostate without lower urinary tract symptoms: Secondary | ICD-10-CM | POA: Diagnosis not present

## 2018-05-30 DIAGNOSIS — G4730 Sleep apnea, unspecified: Secondary | ICD-10-CM | POA: Insufficient documentation

## 2018-05-30 DIAGNOSIS — R3911 Hesitancy of micturition: Secondary | ICD-10-CM | POA: Diagnosis not present

## 2018-05-30 DIAGNOSIS — R339 Retention of urine, unspecified: Secondary | ICD-10-CM | POA: Insufficient documentation

## 2018-05-30 DIAGNOSIS — Z87891 Personal history of nicotine dependence: Secondary | ICD-10-CM | POA: Insufficient documentation

## 2018-05-30 DIAGNOSIS — C61 Malignant neoplasm of prostate: Secondary | ICD-10-CM | POA: Insufficient documentation

## 2018-05-30 DIAGNOSIS — M810 Age-related osteoporosis without current pathological fracture: Secondary | ICD-10-CM | POA: Diagnosis not present

## 2018-05-30 DIAGNOSIS — K921 Melena: Secondary | ICD-10-CM | POA: Insufficient documentation

## 2018-05-30 DIAGNOSIS — K5900 Constipation, unspecified: Secondary | ICD-10-CM | POA: Diagnosis not present

## 2018-05-30 DIAGNOSIS — M109 Gout, unspecified: Secondary | ICD-10-CM | POA: Diagnosis not present

## 2018-05-30 DIAGNOSIS — Z192 Hormone resistant malignancy status: Secondary | ICD-10-CM | POA: Insufficient documentation

## 2018-05-30 DIAGNOSIS — Z79899 Other long term (current) drug therapy: Secondary | ICD-10-CM | POA: Diagnosis not present

## 2018-05-30 NOTE — Telephone Encounter (Signed)
[name]-please inform hospice nurse-Theodore Padilla-the patient is interested in radiation for palliative reasons.  I would defer to hospice/pt regarding continued hospice services.

## 2018-05-30 NOTE — Progress Notes (Signed)
[facility] NOTE  Patient Care Team: Baxter Hire, MD as PCP - General (Internal Medicine)  CHIEF COMPLAINTS/PURPOSE OF CONSULTATION:  Prostate cancer  #  Oncology History   # MARCH 2019 PROSTATE CANCER [Glason score: 5+5]; psa- 19;  s/p bil orchiectomy [june 2019; Dr.Wolfe];   # Progression/constipation- SEP 2019- large pelvic mass involving rectum/bladder; PA- 4.2.   # sep19th- Apalutamide [samples] 4/day.x2 days; stopped/hospice  # acute urinary retention s/p foley   DIAGNOSIS: CASTRATE RESISTANT PROSTATE CANCER  STAGE: IV  ;GOALS:Palliative  CURRENT/MOST RECENT THERAPY: Hospice      Prostate cancer ([place])     HISTORY OF PRESENTING ILLNESS:  Theodore Padilla 85 y.o.  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  Patient is currently in hospice at home.  He lives alone.  His friend lives close by.  Patient states that he is having difficulty with his bowel movements.  He has on Senokot which is not helping.  Most recently started on Dulcolax suppository.  His last bowel movement was this morning.  Mild bloody stools.  Patient continues to have his Foley catheter.;  No fever no chills.  Recent cloudy urine/culture positive for Citrobacter started on Bactrim yesterday.  Patient is very frustrated with his current symptomatology.  States that he is willing to try anything to make him feel better.  He does not feel the current situation is offering any quality of life.  Review of Systems  Constitutional: Positive for malaise/fatigue and weight loss. Negative for chills, diaphoresis and fever.  HENT: Negative for nosebleeds and sore throat.   Eyes: Negative for double vision.  Respiratory: Negative for cough, hemoptysis, sputum production, shortness of breath and wheezing.   Cardiovascular: Negative for chest pain, palpitations, orthopnea and leg swelling.  Gastrointestinal:  Positive for constipation. Negative for abdominal pain, blood in stool, diarrhea, heartburn, melena, nausea and vomiting.  Genitourinary: Negative for dysuria, frequency and urgency.  Musculoskeletal: Positive for back pain and joint pain.  Skin: Negative.  Negative for itching and rash.  Neurological: Negative for dizziness, tingling, focal weakness, weakness and headaches.  Endo/Heme/Allergies: Does not bruise/bleed easily.  Psychiatric/Behavioral: Negative for depression. The patient is not nervous/anxious and does not have insomnia.      MEDICAL HISTORY:  Past Medical History:  Diagnosis Date  . Arthritis   . B12 deficiency   . BPH (benign prostatic hyperplasia)   . Chronic constipation   . Coronary artery disease    with stenting x 1  . Degeneration of spine   . Gout   . Hearing aid worn    bilateral  . History of heart artery stent 2002  . History of kidney stones   . HLD (hyperlipidemia)   . Hypertension   . Lower extremity edema 2019  . Moderate mitral insufficiency   . Moderate tricuspid insufficiency   . Osteoporosis   . Prostate cancer ([place])   . Shortness of breath    occasional  . Sleep apnea    no CPAP, pt denies OSA  . Spine deformity 2019   has severe back pain. previously had surgery without success for improvement  . Urinary hesitancy   . Urinary retention   . Wears dentures    partial upper and lower    SURGICAL HISTORY: Past Surgical History:  Procedure Laterality Date  . APPENDECTOMY    . BACK SURGERY    . CARDIAC CATHETERIZATION  2000   with  stent  . CATARACT EXTRACTION W/PHACO Right 04/18/2017   Procedure: CATARACT EXTRACTION PHACO AND INTRAOCULAR LENS PLACEMENT ([place]) RIGHT;  Surgeon: Leandrew Koyanagi, MD;  Location: [place];  Service: Ophthalmology;  Laterality: Right;  . CATARACT EXTRACTION W/PHACO Left 05/23/2017   Procedure: CATARACT EXTRACTION PHACO AND INTRAOCULAR LENS PLACEMENT ([place]) LEFT;  Surgeon: Leandrew Koyanagi,  MD;  Location: [place];  Service: Ophthalmology;  Laterality: Left;  . ESOPHAGOGASTRODUODENOSCOPY (EGD) WITH PROPOFOL N/A 07/12/2016   Procedure: ESOPHAGOGASTRODUODENOSCOPY (EGD) WITH PROPOFOL;  Surgeon: Jonathon Bellows, MD;  Location: [facility];  Service: Endoscopy;  Laterality: N/A;  . FOREIGN BODY REMOVAL     L knee  . HERNIA REPAIR     umbilicus (below)   . JOINT REPLACEMENT Bilateral    total hip arthroplasty  . KIDNEY STONE SURGERY    . LITHOTRIPSY    . LUMBAR LAMINECTOMY/DECOMPRESSION MICRODISCECTOMY  01/22/2012   Procedure: LUMBAR LAMINECTOMY/DECOMPRESSION MICRODISCECTOMY 1 LEVEL;  Surgeon: Ophelia Charter, MD;  Location: [place] [facility];  Service: Neurosurgery;  Laterality: N/A;  Lumbar two and lumbar three laminectomies  . ORCHIECTOMY Bilateral 10/16/2017   Procedure: ORCHIECTOMY;  Surgeon: Royston Cowper, MD;  Location: [facility];  Service: Urology;  Laterality: Bilateral;  . TONSILLECTOMY    . TOTAL HIP ARTHROPLASTY     bilat    SOCIAL HISTORY: . Social History   Socioeconomic History  . Marital status: Widowed    Spouse name: Not on file  . Number of children: Not on file  . Years of education: Not on file  . Highest education level: Not on file  Occupational History  . Occupation: retired  Scientific laboratory technician  . Financial resource strain: Not on file  . Food insecurity:    Worry: Not on file    Inability: Not on file  . Transportation needs:    Medical: Not on file    Non-medical: Not on file  Tobacco Use  . Smoking status: Former Smoker    Packs/day: 1.00    Years: 10.00    Pack years: 10.00    Last attempt to quit: 05/16/1963    Years since quitting: 55.0  . Smokeless tobacco: Never Used  . Tobacco comment: quit 1960  Substance and Sexual Activity  . Alcohol use: Yes    Alcohol/week: 0.0 standard drinks    Comment: very rarely  . Drug use: No  . Sexual activity: Not Currently  Lifestyle  . Physical activity:    Days per week: Not on file     Minutes per session: Not on file  . Stress: Not on file  Relationships  . Social connections:    Talks on phone: Not on file    Gets together: Not on file    Attends religious service: Not on file    Active member of club or organization: Not on file    Attends meetings of clubs or organizations: Not on file    Relationship status: Not on file  . Intimate partner violence:    Fear of current or ex partner: Not on file    Emotionally abused: Not on file    Physically abused: Not on file    Forced sexual activity: Not on file  Other Topics Concern  . Not on file  Social History Narrative   Patient goes around with a wheelchair-secondary to arthritis/debility.  He lives by himself.  His friends help him with outside chores.  Remote history of smoking no alcohol    FAMILY HISTORY:  Family History  Problem Relation Age of Onset  . Kidney Stones Other   . Kidney disease Neg Hx   . Prostate cancer Neg Hx     ALLERGIES:  is allergic to ciprofloxacin; penicillins; allopurinol; pantoprazole; rofecoxib; and tramadol.  MEDICATIONS:  Current Outpatient Medications  Medication Sig Dispense Refill  . hydrochlorothiazide (HYDRODIURIL) 12.5 MG tablet Take 12.5 mg by mouth daily.  11  . Misc Natural Products (TART CHERRY ADVANCED PO) Take 1 capsule by mouth daily as needed (gout pain).    . Multiple Vitamin (MULTIVITAMIN WITH MINERALS) TABS tablet Take 1 tablet by mouth daily.    Marland Kitchen sulfamethoxazole-trimethoprim (BACTRIM DS,SEPTRA DS) 800-160 MG tablet Take 1 tablet by mouth 2 (two) times daily. 10 tablet 0   Current Facility-Administered Medications  Medication Dose Route Frequency Provider Last Rate Last Dose  . polyethylene glycol (MIRALAX / GLYCOLAX) packet 17 g  17 g Oral Daily Jonathon Bellows, MD          .  PHYSICAL EXAMINATION: ECOG PERFORMANCE STATUS: 2 - Symptomatic, <50% confined to bed  Vitals:   05/30/18 1126 05/30/18 1134  BP:  113/71  Pulse:  76  Resp: 16   Temp:  97.9  F (36.6 C)   Filed Weights   05/30/18 1126  Weight: 186 lb 11.2 oz (84.7 kg)    Physical Exam  Constitutional: He is oriented to person, place, and time.  Elderly frail appearing male patient; in a wheelchair.  Accompanied by a neighbor/friend.  HENT:  Head: Normocephalic and atraumatic.  Mouth/Throat: Oropharynx is clear and moist. No oropharyngeal exudate.  Eyes: Pupils are equal, round, and reactive to light.  Neck: Normal range of motion. Neck supple.  Cardiovascular: Normal rate and regular rhythm.  Pulmonary/Chest: No respiratory distress. He has no wheezes.  Abdominal: Soft. Bowel sounds are normal. He exhibits no distension and no mass. There is no abdominal tenderness. There is no rebound and no guarding.  Genitourinary:    Genitourinary Comments: Rectal exam deferred.  Foley catheter in place.   Musculoskeletal: Normal range of motion.        General: No tenderness or edema.  Neurological: He is alert and oriented to person, place, and time.  Skin: Skin is warm.  Psychiatric: Affect normal.     LABORATORY DATA:  I have reviewed the data as listed Lab Results  Component Value Date   WBC 4.7 02/15/2018   HGB 11.0 (L) 02/15/2018   HCT 31.9 (L) 02/15/2018   MCV 91.6 02/15/2018   PLT 276 02/15/2018   Recent Labs    06/29/17 0931 08/14/17 1106 08/31/17 1012 10/16/17 1538 02/15/18 0816  NA 137  --  138 140 141  K 5.1  --  4.1 5.1 4.4  CL 105  --  105  --  103  CO2 23  --  27  --  30  GLUCOSE 137*  --  96 111* 156*  BUN 37*  --  31*  --  19  CREATININE 1.33* 1.40* 1.35*  --  1.20  CALCIUM 8.6*  --  8.5*  --  9.2  GFRNONAA 44*  --  44*  --  50*  GFRAA 51*  --  50*  --  58*  PROT 7.4  --   --   --  7.2  ALBUMIN 3.5  --   --   --  3.0*  AST 20  --   --   --  21  ALT 11*  --   --   --  12  ALKPHOS 68  --   --   --  58  BILITOT 0.8  --   --   --  0.5    RADIOGRAPHIC STUDIES: I have personally reviewed the radiological images as listed and agreed with the  findings in the report. No results found.  ASSESSMENT & PLAN:   Prostate cancer [facility]) # Prostate cancer- STAGE IV; castrate resistant [status post orchiectomy in June 2019]; bulky pelvic mass with possible involvement of the rectum/bladder.   #Clinically progressive symptoms-constipation/urinary obstruction.  #Had a long discussion with patient regarding difficult situation/limited options.  Given his age medical comorbidities-question diverting colostomy.  However radiation might be reasonable.  Discussed the schedule/possible complications include but not limited to radiation proctitis/cystitis etc.  Patient interested in radiation evaluation.   #Also discussed regarding use of antihormone therapy- like xtandi/apa/zytiga etc. patient currently under hospice care.  Patient will need to come off hospice care/unfortunately poor safety situation.Marland Kitchen  # urinary retention second malignancy s/p foley? UTI/ Citrobacter; currently on Bactrim BID.    #Overall prognosis is poor/patient understands-however he feels that his quality of life is significantly poor currently-and would try anything to feel better.  # 40 minutes face-to-face with the patient discussing the above plan of care; more than 50% of time spent on prognosis/ natural history; counseling and coordination.   # DISPOSITION:  #please update friend's contact number # referral Dr.Crystal ASAP- prostate cancer/invasion of rectum # follow up in 3 weeks-cbc/cmp/PSA-Dr.B Cc; Dr.Wolfe/Dr.Jonhston.   All questions were answered. The patient knows to call the clinic with any problems, questions or concerns.    Cammie Sickle, MD 05/30/2018 8:59 PM

## 2018-05-30 NOTE — Telephone Encounter (Signed)
x

## 2018-05-30 NOTE — Progress Notes (Signed)
Patient has lost 19 pounds in the last 5 months. He reports poor appetite and trouble with moving his bowels. He is being treated for a urinary tract infection.

## 2018-05-30 NOTE — Assessment & Plan Note (Addendum)
#   Prostate cancer- STAGE IV; castrate resistant [status post orchiectomy in June 2019]; bulky pelvic mass with possible involvement of the rectum/bladder.   #Clinically progressive symptoms-constipation/urinary obstruction.  #Had a long discussion with patient regarding difficult situation/limited options.  Given his age medical comorbidities-question diverting colostomy.  However radiation might be reasonable.  Discussed the schedule/possible complications include but not limited to radiation proctitis/cystitis etc.  Patient interested in radiation evaluation.   #Also discussed regarding use of antihormone therapy- like xtandi/apa/zytiga etc. patient currently under hospice care.  Patient will need to come off hospice care/unfortunately poor safety situation.Marland Kitchen  # urinary retention second malignancy s/p foley? UTI/ Citrobacter; currently on Bactrim BID.    #Overall prognosis is poor/patient understands-however he feels that his quality of life is significantly poor currently-and would try anything to feel better.  # 40 minutes face-to-face with the patient discussing the above plan of care; more than 50% of time spent on prognosis/ natural history; counseling and coordination.   # DISPOSITION:  #please update friend's contact number # referral Dr.Crystal ASAP- prostate cancer/invasion of rectum # follow up in 3 weeks-cbc/cmp/PSA-Dr.B Cc; Dr.Wolfe/Dr.Jonhston.

## 2018-05-31 NOTE — Telephone Encounter (Signed)
Spoke with [employer]. RN made aware of plan of care.

## 2018-06-02 ENCOUNTER — Encounter: Payer: Self-pay | Admitting: Internal Medicine

## 2018-06-03 ENCOUNTER — Encounter: Payer: Self-pay | Admitting: Internal Medicine

## 2018-06-04 ENCOUNTER — Ambulatory Visit: Payer: Medicare Other | Attending: Radiation Oncology | Admitting: Radiation Oncology

## 2018-06-13 ENCOUNTER — Other Ambulatory Visit: Payer: Self-pay

## 2018-06-13 ENCOUNTER — Encounter: Payer: Self-pay | Admitting: Radiation Oncology

## 2018-06-13 ENCOUNTER — Ambulatory Visit
Admission: RE | Admit: 2018-06-13 | Discharge: 2018-06-13 | Disposition: A | Discharge status: Home / Self Care | Source: Ambulatory Visit | Attending: Radiation Oncology | Admitting: Radiation Oncology

## 2018-06-13 VITALS — BP 120/72 | HR 97 | Temp 98.0°F | Resp 18

## 2018-06-13 DIAGNOSIS — I2510 Atherosclerotic heart disease of native coronary artery without angina pectoris: Secondary | ICD-10-CM | POA: Diagnosis not present

## 2018-06-13 DIAGNOSIS — Z79899 Other long term (current) drug therapy: Secondary | ICD-10-CM | POA: Insufficient documentation

## 2018-06-13 DIAGNOSIS — E785 Hyperlipidemia, unspecified: Secondary | ICD-10-CM | POA: Diagnosis not present

## 2018-06-13 DIAGNOSIS — M810 Age-related osteoporosis without current pathological fracture: Secondary | ICD-10-CM | POA: Diagnosis not present

## 2018-06-13 DIAGNOSIS — G893 Neoplasm related pain (acute) (chronic): Secondary | ICD-10-CM | POA: Diagnosis not present

## 2018-06-13 DIAGNOSIS — E538 Deficiency of other specified B group vitamins: Secondary | ICD-10-CM | POA: Diagnosis not present

## 2018-06-13 DIAGNOSIS — Z87891 Personal history of nicotine dependence: Secondary | ICD-10-CM | POA: Diagnosis not present

## 2018-06-13 DIAGNOSIS — R3911 Hesitancy of micturition: Secondary | ICD-10-CM | POA: Insufficient documentation

## 2018-06-13 DIAGNOSIS — Z192 Hormone resistant malignancy status: Secondary | ICD-10-CM | POA: Insufficient documentation

## 2018-06-13 DIAGNOSIS — M109 Gout, unspecified: Secondary | ICD-10-CM | POA: Diagnosis not present

## 2018-06-13 DIAGNOSIS — C61 Malignant neoplasm of prostate: Secondary | ICD-10-CM | POA: Insufficient documentation

## 2018-06-13 DIAGNOSIS — M129 Arthropathy, unspecified: Secondary | ICD-10-CM | POA: Insufficient documentation

## 2018-06-13 DIAGNOSIS — C7951 Secondary malignant neoplasm of bone: Secondary | ICD-10-CM | POA: Diagnosis not present

## 2018-06-13 DIAGNOSIS — Z87442 Personal history of urinary calculi: Secondary | ICD-10-CM | POA: Insufficient documentation

## 2018-06-13 DIAGNOSIS — R0602 Shortness of breath: Secondary | ICD-10-CM | POA: Diagnosis not present

## 2018-06-13 DIAGNOSIS — R609 Edema, unspecified: Secondary | ICD-10-CM | POA: Insufficient documentation

## 2018-06-13 DIAGNOSIS — K5900 Constipation, unspecified: Secondary | ICD-10-CM | POA: Diagnosis not present

## 2018-06-13 DIAGNOSIS — I10 Essential (primary) hypertension: Secondary | ICD-10-CM | POA: Insufficient documentation

## 2018-06-13 DIAGNOSIS — G4730 Sleep apnea, unspecified: Secondary | ICD-10-CM | POA: Insufficient documentation

## 2018-06-13 DIAGNOSIS — R339 Retention of urine, unspecified: Secondary | ICD-10-CM | POA: Diagnosis not present

## 2018-06-13 NOTE — Consult Note (Signed)
NEW PATIENT EVALUATION  Name: Theodore Padilla  MRN: [id number]  Date:   06/13/2018     DOB: Nov 09, 1921   This 85 y.o. male patient presents to the clinic for initial evaluation of palliative ration therapy to his pelvis for large locally advanced prostate cancer invading bladder and rectum.  REFERRING PHYSICIAN: Baxter Hire, MD  CHIEF COMPLAINT:  Chief Complaint  Patient presents with  . Prostate Cancer    Initial Eval    DIAGNOSIS: The encounter diagnosis was Prostate cancer ([place]).   PREVIOUS INVESTIGATIONS:  CT scans and bone scan reviewed Pathology reports reviewed Clinical notes reviewed  HPI: patient is a 85 year old male diagnosed in March 2019 with a Gleason 10 (5+5) adenocarcinoma the prostate with a PSA of 19. He underwent bilateral orchiectomy. He has a progressive prostatic carcinoma mass in his pelvis by CT criteria probably invading rectum and bladder and seminal vesicle region. He is having significant problems with constipation secondary to extrinsic compression of his rectum. He is also having significant pelvic pain. He has bone scan evidence of metastatic disease.he currently has a Foley catheter.patient lives alone and is accompanied by his nephew today is here for evaluation of palliative treatment to his pelvis.he continues with most significant complaints of constipation and pelvic pain.  PLANNED TREATMENT REGIMEN: palliative radiation therapy to pelvis  PAST MEDICAL HISTORY:  has a past medical history of Arthritis, B12 deficiency, BPH (benign prostatic hyperplasia), Chronic constipation, Coronary artery disease, Degeneration of spine, Gout, Hearing aid worn, History of heart artery stent (2002), History of kidney stones, HLD (hyperlipidemia), Hypertension, Lower extremity edema (2019), Moderate mitral insufficiency, Moderate tricuspid insufficiency, Osteoporosis, Prostate cancer ([place]), Shortness of breath, Sleep apnea, Spine deformity (2019), Urinary hesitancy,  Urinary retention, and Wears dentures.    PAST SURGICAL HISTORY:  Past Surgical History:  Procedure Laterality Date  . APPENDECTOMY    . BACK SURGERY    . CARDIAC CATHETERIZATION  2000   with stent  . CATARACT EXTRACTION W/PHACO Right 04/18/2017   Procedure: CATARACT EXTRACTION PHACO AND INTRAOCULAR LENS PLACEMENT ([place]) RIGHT;  Surgeon: Leandrew Koyanagi, MD;  Location: [place];  Service: Ophthalmology;  Laterality: Right;  . CATARACT EXTRACTION W/PHACO Left 05/23/2017   Procedure: CATARACT EXTRACTION PHACO AND INTRAOCULAR LENS PLACEMENT ([place]) LEFT;  Surgeon: Leandrew Koyanagi, MD;  Location: [place];  Service: Ophthalmology;  Laterality: Left;  . ESOPHAGOGASTRODUODENOSCOPY (EGD) WITH PROPOFOL N/A 07/12/2016   Procedure: ESOPHAGOGASTRODUODENOSCOPY (EGD) WITH PROPOFOL;  Surgeon: Jonathon Bellows, MD;  Location: [facility];  Service: Endoscopy;  Laterality: N/A;  . FOREIGN BODY REMOVAL     L knee  . HERNIA REPAIR     umbilicus (below)   . JOINT REPLACEMENT Bilateral    total hip arthroplasty  . KIDNEY STONE SURGERY    . LITHOTRIPSY    . LUMBAR LAMINECTOMY/DECOMPRESSION MICRODISCECTOMY  01/22/2012   Procedure: LUMBAR LAMINECTOMY/DECOMPRESSION MICRODISCECTOMY 1 LEVEL;  Surgeon: Ophelia Charter, MD;  Location: [place] [facility];  Service: Neurosurgery;  Laterality: N/A;  Lumbar two and lumbar three laminectomies  . ORCHIECTOMY Bilateral 10/16/2017   Procedure: ORCHIECTOMY;  Surgeon: Royston Cowper, MD;  Location: [facility];  Service: Urology;  Laterality: Bilateral;  . TONSILLECTOMY    . TOTAL HIP ARTHROPLASTY     bilat    FAMILY HISTORY: family history includes Kidney Stones in an other family member.  SOCIAL HISTORY:  reports that he quit smoking about 55 years ago. He has a 10.00 pack-year smoking history. He has never used smokeless  tobacco. He reports current alcohol use. He reports that he does not use drugs.  ALLERGIES: Ciprofloxacin; Penicillins; Allopurinol;  Pantoprazole; Rofecoxib; and Tramadol  MEDICATIONS:  Current Outpatient Medications  Medication Sig Dispense Refill  . hydrochlorothiazide (HYDRODIURIL) 12.5 MG tablet Take 12.5 mg by mouth daily.  11  . Misc Natural Products (TART CHERRY ADVANCED PO) Take 1 capsule by mouth daily as needed (gout pain).    . Multiple Vitamin (MULTIVITAMIN WITH MINERALS) TABS tablet Take 1 tablet by mouth daily.    Marland Kitchen sulfamethoxazole-trimethoprim (BACTRIM DS,SEPTRA DS) 800-160 MG tablet Take 1 tablet by mouth 2 (two) times daily. 10 tablet 0   Current Facility-Administered Medications  Medication Dose Route Frequency Provider Last Rate Last Dose  . polyethylene glycol (MIRALAX / GLYCOLAX) packet 17 g  17 g Oral Daily Jonathon Bellows, MD        ECOG PERFORMANCE STATUS:  2 - Symptomatic, <50% confined to bed  REVIEW OF SYSTEMS:  Patient denies any weight loss, fatigue, weakness, fever, chills or night sweats. Patient denies any loss of vision, blurred vision. Patient denies any ringing  of the ears or hearing loss. No irregular heartbeat. Patient denies heart murmur or history of fainting. Patient denies any chest pain or pain radiating to her upper extremities. Patient denies any shortness of breath, difficulty breathing at night, cough or hemoptysis. Patient denies any swelling in the lower legs. Patient denies any nausea vomiting, vomiting of blood, or coffee ground material in the vomitus. Patient denies any stomach pain. Patient states has had normal bowel movements no significant constipation or diarrhea. Patient denies any dysuria, hematuria or significant nocturia. Patient denies any problems walking, swelling in the joints or loss of balance. Patient denies any skin changes, loss of hair or loss of weight. Patient denies any excessive worrying or anxiety or significant depression. Patient denies any problems with insomnia. Patient denies excessive thirst, polyuria, polydipsia. Patient denies any swollen glands,  patient denies easy bruising or easy bleeding. Patient denies any recent infections, allergies or URI. Patient "s visual fields have not changed significantly in recent time.    PHYSICAL EXAM: BP 120/72   Pulse 97   Temp 98 F (36.7 C)   Resp 18  Elderly male in NAD wheelchair-bound. Patient has a Foley catheter present.Well-developed well-nourished patient in NAD. HEENT reveals PERLA, EOMI, discs not visualized.  Oral cavity is clear. No oral mucosal lesions are identified. Neck is clear without evidence of cervical or supraclavicular adenopathy. Lungs are clear to A&P. Cardiac examination is essentially unremarkable with regular rate and rhythm without murmur rub or thrill. Abdomen is benign with no organomegaly or masses noted. Motor sensory and DTR levels are equal and symmetric in the upper and lower extremities. Cranial nerves II through XII are grossly intact. Proprioception is intact. No peripheral adenopathy or edema is identified. No motor or sensory levels are noted. Crude visual fields are within normal range.  LABORATORY DATA: pathology reports reviewed    RADIOLOGY RESULTS:cT scans and bone scan reviewed and compatible with the above-stated findings   IMPRESSION: stage IV castrate resistant Gleason 10 adenocarcinoma prostate with mass effect on bladder and rectum in 85 year old male  PLAN: at this time I proposed a palliative course of 4000 cGy over 4 weeks to his pelvis targeting the area of prostate tumor with extrinsic compression on his bladder and rectum.Risks and benefits of treatment including skin reaction fatigue alteration of blood counts possible proctitis all were discussed in detail with the patient and  his nephew. I have personally set up and ordered CT simulation. Patient copy of my treatment plan well as an is highly anticipates commencing with his palliative treatment.  I would like to take this opportunity to thank you for allowing me to participate in the care of  your patient.Noreene Filbert, MD

## 2018-06-14 ENCOUNTER — Telehealth: Payer: Self-pay | Admitting: *Deleted

## 2018-06-14 NOTE — Telephone Encounter (Signed)
Left msg for Patricia that apts should be kept as scheduled per Dr. Rogue Bussing

## 2018-06-14 NOTE — Telephone Encounter (Signed)
-----   Message from [place], [place] sent at 06/13/2018  3:30 PM EST ----- Regarding: 06/20/18 lab/MD appt FYI...   Patient's Contact- Dani (Support) [place] called and stated that pt wanted to know if he still needed to keep the scheduled 06/20/18 lab/MD appt since he is being seen by Dr. Baruch Gouty now?  Please Advise    Thanks, Ellison Hughs

## 2018-06-20 ENCOUNTER — Other Ambulatory Visit: Payer: Medicare Other

## 2018-06-20 ENCOUNTER — Ambulatory Visit: Payer: Medicare Other | Admitting: Internal Medicine

## 2018-06-25 ENCOUNTER — Ambulatory Visit
Admission: RE | Admit: 2018-06-25 | Discharge: 2018-06-25 | Disposition: A | Discharge status: Home / Self Care | Source: Ambulatory Visit | Attending: Radiation Oncology | Admitting: Radiation Oncology

## 2018-06-25 DIAGNOSIS — C61 Malignant neoplasm of prostate: Secondary | ICD-10-CM | POA: Insufficient documentation

## 2018-06-25 DIAGNOSIS — Z510 Encounter for antineoplastic radiation therapy: Secondary | ICD-10-CM | POA: Diagnosis not present

## 2018-06-26 ENCOUNTER — Other Ambulatory Visit: Payer: Self-pay | Admitting: *Deleted

## 2018-06-26 DIAGNOSIS — C61 Malignant neoplasm of prostate: Secondary | ICD-10-CM

## 2018-06-26 DIAGNOSIS — Z510 Encounter for antineoplastic radiation therapy: Secondary | ICD-10-CM | POA: Diagnosis not present

## 2018-06-27 ENCOUNTER — Ambulatory Visit
Admission: RE | Admit: 2018-06-27 | Discharge: 2018-06-27 | Disposition: A | Discharge status: Home / Self Care | Source: Ambulatory Visit | Attending: Radiation Oncology | Admitting: Radiation Oncology

## 2018-06-27 DIAGNOSIS — Z510 Encounter for antineoplastic radiation therapy: Secondary | ICD-10-CM | POA: Diagnosis not present

## 2018-07-01 ENCOUNTER — Ambulatory Visit
Admission: RE | Admit: 2018-07-01 | Discharge: 2018-07-01 | Disposition: A | Discharge status: Home / Self Care | Source: Ambulatory Visit | Attending: Radiation Oncology | Admitting: Radiation Oncology

## 2018-07-01 DIAGNOSIS — Z510 Encounter for antineoplastic radiation therapy: Secondary | ICD-10-CM | POA: Diagnosis not present

## 2018-07-02 ENCOUNTER — Inpatient Hospital Stay: Attending: Hospice and Palliative Medicine

## 2018-07-02 ENCOUNTER — Ambulatory Visit
Admission: RE | Admit: 2018-07-02 | Discharge: 2018-07-02 | Disposition: A | Discharge status: Home / Self Care | Source: Ambulatory Visit | Attending: Radiation Oncology | Admitting: Radiation Oncology

## 2018-07-02 DIAGNOSIS — Z515 Encounter for palliative care: Secondary | ICD-10-CM | POA: Insufficient documentation

## 2018-07-02 DIAGNOSIS — R338 Other retention of urine: Secondary | ICD-10-CM | POA: Insufficient documentation

## 2018-07-02 DIAGNOSIS — C61 Malignant neoplasm of prostate: Secondary | ICD-10-CM | POA: Insufficient documentation

## 2018-07-02 DIAGNOSIS — R600 Localized edema: Secondary | ICD-10-CM | POA: Insufficient documentation

## 2018-07-02 DIAGNOSIS — K5909 Other constipation: Secondary | ICD-10-CM | POA: Insufficient documentation

## 2018-07-02 DIAGNOSIS — I2510 Atherosclerotic heart disease of native coronary artery without angina pectoris: Secondary | ICD-10-CM | POA: Insufficient documentation

## 2018-07-02 DIAGNOSIS — Z510 Encounter for antineoplastic radiation therapy: Secondary | ICD-10-CM | POA: Diagnosis not present

## 2018-07-02 DIAGNOSIS — R630 Anorexia: Secondary | ICD-10-CM | POA: Insufficient documentation

## 2018-07-02 DIAGNOSIS — E785 Hyperlipidemia, unspecified: Secondary | ICD-10-CM | POA: Insufficient documentation

## 2018-07-02 DIAGNOSIS — Z79899 Other long term (current) drug therapy: Secondary | ICD-10-CM | POA: Insufficient documentation

## 2018-07-02 DIAGNOSIS — I10 Essential (primary) hypertension: Secondary | ICD-10-CM | POA: Insufficient documentation

## 2018-07-02 DIAGNOSIS — Z87891 Personal history of nicotine dependence: Secondary | ICD-10-CM | POA: Insufficient documentation

## 2018-07-02 DIAGNOSIS — C7951 Secondary malignant neoplasm of bone: Secondary | ICD-10-CM | POA: Insufficient documentation

## 2018-07-02 DIAGNOSIS — G893 Neoplasm related pain (acute) (chronic): Secondary | ICD-10-CM | POA: Insufficient documentation

## 2018-07-02 DIAGNOSIS — Z955 Presence of coronary angioplasty implant and graft: Secondary | ICD-10-CM | POA: Insufficient documentation

## 2018-07-02 DIAGNOSIS — M109 Gout, unspecified: Secondary | ICD-10-CM | POA: Insufficient documentation

## 2018-07-03 ENCOUNTER — Ambulatory Visit
Admission: RE | Admit: 2018-07-03 | Discharge: 2018-07-03 | Disposition: A | Discharge status: Home / Self Care | Source: Ambulatory Visit | Attending: Radiation Oncology | Admitting: Radiation Oncology

## 2018-07-03 ENCOUNTER — Inpatient Hospital Stay

## 2018-07-03 DIAGNOSIS — Z510 Encounter for antineoplastic radiation therapy: Secondary | ICD-10-CM | POA: Diagnosis not present

## 2018-07-04 ENCOUNTER — Inpatient Hospital Stay

## 2018-07-04 ENCOUNTER — Ambulatory Visit
Admission: RE | Admit: 2018-07-04 | Discharge: 2018-07-04 | Disposition: A | Discharge status: Home / Self Care | Source: Ambulatory Visit | Attending: Radiation Oncology | Admitting: Radiation Oncology

## 2018-07-04 DIAGNOSIS — Z510 Encounter for antineoplastic radiation therapy: Secondary | ICD-10-CM | POA: Diagnosis not present

## 2018-07-05 ENCOUNTER — Inpatient Hospital Stay

## 2018-07-05 ENCOUNTER — Ambulatory Visit

## 2018-07-08 ENCOUNTER — Ambulatory Visit
Admission: RE | Admit: 2018-07-08 | Discharge: 2018-07-08 | Disposition: A | Discharge status: Home / Self Care | Source: Ambulatory Visit | Attending: Radiation Oncology | Admitting: Radiation Oncology

## 2018-07-08 ENCOUNTER — Inpatient Hospital Stay

## 2018-07-08 DIAGNOSIS — Z510 Encounter for antineoplastic radiation therapy: Secondary | ICD-10-CM | POA: Diagnosis not present

## 2018-07-09 ENCOUNTER — Ambulatory Visit
Admission: RE | Admit: 2018-07-09 | Discharge: 2018-07-09 | Disposition: A | Discharge status: Home / Self Care | Source: Ambulatory Visit | Attending: Hospice and Palliative Medicine | Admitting: Hospice and Palliative Medicine

## 2018-07-09 ENCOUNTER — Encounter: Payer: Self-pay | Admitting: Hospice and Palliative Medicine

## 2018-07-09 ENCOUNTER — Inpatient Hospital Stay (HOSPITAL_BASED_OUTPATIENT_CLINIC_OR_DEPARTMENT_OTHER): Admitting: Hospice and Palliative Medicine

## 2018-07-09 ENCOUNTER — Ambulatory Visit
Admission: RE | Admit: 2018-07-09 | Discharge: 2018-07-09 | Disposition: A | Discharge status: Home / Self Care | Source: Ambulatory Visit | Attending: Radiation Oncology | Admitting: Radiation Oncology

## 2018-07-09 ENCOUNTER — Inpatient Hospital Stay

## 2018-07-09 VITALS — BP 121/61 | HR 67 | Temp 96.5°F | Resp 18

## 2018-07-09 DIAGNOSIS — C61 Malignant neoplasm of prostate: Secondary | ICD-10-CM | POA: Diagnosis not present

## 2018-07-09 DIAGNOSIS — R630 Anorexia: Secondary | ICD-10-CM | POA: Diagnosis not present

## 2018-07-09 DIAGNOSIS — Z955 Presence of coronary angioplasty implant and graft: Secondary | ICD-10-CM | POA: Diagnosis not present

## 2018-07-09 DIAGNOSIS — Z87891 Personal history of nicotine dependence: Secondary | ICD-10-CM | POA: Diagnosis not present

## 2018-07-09 DIAGNOSIS — Z515 Encounter for palliative care: Secondary | ICD-10-CM

## 2018-07-09 DIAGNOSIS — M109 Gout, unspecified: Secondary | ICD-10-CM | POA: Diagnosis not present

## 2018-07-09 DIAGNOSIS — R338 Other retention of urine: Secondary | ICD-10-CM | POA: Diagnosis not present

## 2018-07-09 DIAGNOSIS — K5900 Constipation, unspecified: Secondary | ICD-10-CM

## 2018-07-09 DIAGNOSIS — Z79899 Other long term (current) drug therapy: Secondary | ICD-10-CM | POA: Diagnosis not present

## 2018-07-09 DIAGNOSIS — K5909 Other constipation: Secondary | ICD-10-CM

## 2018-07-09 DIAGNOSIS — G893 Neoplasm related pain (acute) (chronic): Secondary | ICD-10-CM

## 2018-07-09 DIAGNOSIS — C7951 Secondary malignant neoplasm of bone: Secondary | ICD-10-CM | POA: Diagnosis not present

## 2018-07-09 DIAGNOSIS — I2510 Atherosclerotic heart disease of native coronary artery without angina pectoris: Secondary | ICD-10-CM | POA: Diagnosis not present

## 2018-07-09 DIAGNOSIS — I10 Essential (primary) hypertension: Secondary | ICD-10-CM | POA: Diagnosis not present

## 2018-07-09 DIAGNOSIS — Z510 Encounter for antineoplastic radiation therapy: Secondary | ICD-10-CM | POA: Diagnosis not present

## 2018-07-09 DIAGNOSIS — E785 Hyperlipidemia, unspecified: Secondary | ICD-10-CM | POA: Diagnosis not present

## 2018-07-09 DIAGNOSIS — R600 Localized edema: Secondary | ICD-10-CM | POA: Diagnosis not present

## 2018-07-09 MED ORDER — DEXAMETHASONE 4 MG PO TABS: 2.0000 mg | ORAL_TABLET | Freq: Every day | ORAL | 0 refills | Status: DC

## 2018-07-09 MED ORDER — LACTULOSE 10 GM/15ML PO SOLN: 20.0000 g | Freq: Two times a day (BID) | ORAL | 0 refills | Status: DC | PRN

## 2018-07-09 NOTE — Progress Notes (Signed)
[place]  Telephone:([phone] [phone]   Name: Theodore Padilla Date: 07/09/2018 MRN: [id number]  DOB: 05-02-1922  Patient Care Team: Baxter Hire, MD as PCP - General (Internal Medicine)    REASON FOR CONSULTATION: Palliative Care consult requested for this 85 y.o. male with multiple medical problems including stage IV prostate cancer status post bilateral orchiectomy (June 2019) with bone mets, who is currently followed by hospice at home.  Patient has a large progressive pelvic mass causing severe constipation and urinary retention. He has an indwelling Foley catheter. Patient has had severe pain and is currently receiving RT to his pelvic mass. Patient was referred to palliative care to help address symptoms.   SOCIAL HISTORY:    Patient lives at home alone. He has a nephew who is involved. Patient is followed at home by hospice.   ADVANCE DIRECTIVES:  Not on file  CODE STATUS: DNR  PAST MEDICAL HISTORY: Past Medical History:  Diagnosis Date  . Arthritis   . B12 deficiency   . BPH (benign prostatic hyperplasia)   . Chronic constipation   . Coronary artery disease    with stenting x 1  . Degeneration of spine   . Gout   . Hearing aid worn    bilateral  . History of heart artery stent 2002  . History of kidney stones   . HLD (hyperlipidemia)   . Hypertension   . Lower extremity edema 2019  . Moderate mitral insufficiency   . Moderate tricuspid insufficiency   . Osteoporosis   . Prostate cancer ([place])   . Shortness of breath    occasional  . Sleep apnea    no CPAP, pt denies OSA  . Spine deformity 2019   has severe back pain. previously had surgery without success for improvement  . Urinary hesitancy   . Urinary retention   . Wears dentures    partial upper and lower    PAST SURGICAL HISTORY:  Past Surgical History:  Procedure Laterality Date  . APPENDECTOMY    . BACK SURGERY    . CARDIAC  CATHETERIZATION  2000   with stent  . CATARACT EXTRACTION W/PHACO Right 04/18/2017   Procedure: CATARACT EXTRACTION PHACO AND INTRAOCULAR LENS PLACEMENT ([place]) RIGHT;  Surgeon: Leandrew Koyanagi, MD;  Location: [facility];  Service: Ophthalmology;  Laterality: Right;  . CATARACT EXTRACTION W/PHACO Left 05/23/2017   Procedure: CATARACT EXTRACTION PHACO AND INTRAOCULAR LENS PLACEMENT ([place]) LEFT;  Surgeon: Leandrew Koyanagi, MD;  Location: [facility];  Service: Ophthalmology;  Laterality: Left;  . ESOPHAGOGASTRODUODENOSCOPY (EGD) WITH PROPOFOL N/A 07/12/2016   Procedure: ESOPHAGOGASTRODUODENOSCOPY (EGD) WITH PROPOFOL;  Surgeon: Jonathon Bellows, MD;  Location: [facility];  Service: Endoscopy;  Laterality: N/A;  . FOREIGN BODY REMOVAL     L knee  . HERNIA REPAIR     umbilicus (below)   . JOINT REPLACEMENT Bilateral    total hip arthroplasty  . KIDNEY STONE SURGERY    . LITHOTRIPSY    . LUMBAR LAMINECTOMY/DECOMPRESSION MICRODISCECTOMY  01/22/2012   Procedure: LUMBAR LAMINECTOMY/DECOMPRESSION MICRODISCECTOMY 1 LEVEL;  Surgeon: Ophelia Charter, MD;  Location: [facility];  Service: Neurosurgery;  Laterality: N/A;  Lumbar two and lumbar three laminectomies  . ORCHIECTOMY Bilateral 10/16/2017   Procedure: ORCHIECTOMY;  Surgeon: Royston Cowper, MD;  Location: [facility];  Service: Urology;  Laterality: Bilateral;  . TONSILLECTOMY    . TOTAL HIP ARTHROPLASTY     bilat  HEMATOLOGY/ONCOLOGY HISTORY:  Oncology History   # MARCH 2019 PROSTATE CANCER [Glason score: 5+5]; psa- 19;  s/p bil orchiectomy [june 2019; Dr.Wolfe];   # Progression/constipation- SEP 2019- large pelvic mass involving rectum/bladder; PA- 4.2.   # sep19th- Apalutamide [samples] 4/day.x2 days; stopped/hospice  # acute urinary retention s/p foley   DIAGNOSIS: CASTRATE RESISTANT PROSTATE CANCER  STAGE: IV  ;GOALS:Palliative  CURRENT/MOST RECENT THERAPY: Hospice      Prostate cancer ([place])     ALLERGIES:  is allergic to ciprofloxacin; penicillins; allopurinol; pantoprazole; rofecoxib; and tramadol.  MEDICATIONS:  Current Outpatient Medications  Medication Sig Dispense Refill  . hydrochlorothiazide (HYDRODIURIL) 12.5 MG tablet Take 12.5 mg by mouth daily.  11  . Misc Natural Products (TART CHERRY ADVANCED PO) Take 1 capsule by mouth daily as needed (gout pain).    . Multiple Vitamin (MULTIVITAMIN WITH MINERALS) TABS tablet Take 1 tablet by mouth daily.    Marland Kitchen sulfamethoxazole-trimethoprim (BACTRIM DS,SEPTRA DS) 800-160 MG tablet Take 1 tablet by mouth 2 (two) times daily. 10 tablet 0   Current Facility-Administered Medications  Medication Dose Route Frequency Provider Last Rate Last Dose  . polyethylene glycol (MIRALAX / GLYCOLAX) packet 17 g  17 g Oral Daily Jonathon Bellows, MD        VITAL SIGNS: There were no vitals taken for this visit. There were no vitals filed for this visit.  Estimated body mass index is 31.31 kg/m as calculated from the following:   Height as of 05/30/18: 5' 4.75" (1.645 m).   Weight as of 05/30/18: 186 lb 11.2 oz (84.7 kg).  LABS: CBC:    Component Value Date/Time   WBC 4.7 02/15/2018 0816   HGB 11.0 (L) 02/15/2018 0816   HGB 8.4 (L) 08/31/2014 0327   HCT 31.9 (L) 02/15/2018 0816   HCT 26.4 (L) 08/30/2014 0354   PLT 276 02/15/2018 0816   PLT 171 08/30/2014 0354   MCV 91.6 02/15/2018 0816   MCV 97 08/30/2014 0354   NEUTROABS 2.9 02/15/2018 0816   NEUTROABS 5.6 08/30/2014 0354   LYMPHSABS 1.0 02/15/2018 0816   LYMPHSABS 1.4 08/30/2014 0354   MONOABS 0.4 02/15/2018 0816   MONOABS 0.6 08/30/2014 0354   EOSABS 0.4 02/15/2018 0816   EOSABS 0.2 08/30/2014 0354   BASOSABS 0.1 02/15/2018 0816   BASOSABS 0.0 08/30/2014 0354   Comprehensive Metabolic Panel:    Component Value Date/Time   NA 141 02/15/2018 0816   NA 139 08/30/2014 0354   K 4.4 02/15/2018 0816   K 4.4 08/30/2014 0354   CL 103 02/15/2018 0816   CL 111 08/30/2014 0354   CO2  30 02/15/2018 0816   CO2 23 08/30/2014 0354   BUN 19 02/15/2018 0816   BUN 72 (H) 08/30/2014 0354   CREATININE 1.20 02/15/2018 0816   CREATININE 1.15 08/30/2014 0354   GLUCOSE 156 (H) 02/15/2018 0816   GLUCOSE 106 (H) 08/30/2014 0354   CALCIUM 9.2 02/15/2018 0816   CALCIUM 7.9 (L) 08/30/2014 0354   AST 21 02/15/2018 0816   AST 19 08/29/2014 1007   ALT 12 02/15/2018 0816   ALT 14 (L) 08/29/2014 1007   ALKPHOS 58 02/15/2018 0816   ALKPHOS 50 08/29/2014 1007   BILITOT 0.5 02/15/2018 0816   BILITOT 0.5 08/29/2014 1007   PROT 7.2 02/15/2018 0816   PROT 5.8 (L) 08/29/2014 1007   ALBUMIN 3.0 (L) 02/15/2018 0816   ALBUMIN 3.1 (L) 08/29/2014 1007    RADIOGRAPHIC STUDIES: No results found.  PERFORMANCE STATUS (ECOG) :  2 - Symptomatic, <50% confined to bed  Review of Systems As noted above. Otherwise, a complete review of systems is negative.  Physical Exam General: NAD, frail appearing, thin Cardiovascular: regular rate and rhythm Pulmonary: clear ant fields Abdomen: soft, nontender, hypoactive bowel sounds GU: no suprapubic tenderness Extremities: trace edema BLEs Skin: no rashes Neurological: Weakness but otherwise nonfocal  IMPRESSION: Patient presented to the clinic today with 4-5 days of constipation and occasional abdominal/hip/back pain.   Patient says he has been taking 2 tablets of dulcolax twice daily. He has also tried milk of magnesia. He was previously tried on [place], but unclear how much he was taking.   Patient currently does not have any pain in the clinic today. He was started yesterday on morphine by hospice and patient says that completely relieved his pain. Patient says he has only received a single dose of the morphine.   Case discussed with Dr. Rogue Bussing. CT reviewed from 01/2018. Patient is currently receiving RT to pelvic mass. It is possible that patient has completely obstructed. Will obtain abdominal film. I discussed option of hospitalization with  patient in the event of obstruction vs returning home with hospice. He is most likely not a candidate for intervention via surgery or colonoscopy. We also discussed option of comfort measures and possible transfer to the [facility] in the near future. Patient has extremely limited support at home even with hospice care.   I called and spoke with patient's hospice nurse, Aldrich [phone]).   Addendum: Abdominal film without evidence of acute obstruction. Spoke with radiologist, Dr. Carvel Getting by phone. No real stool burden on image.    Will start dexamethasone, which may help with pain. Will prescribe lactulose prn for constipation.  Case and plan discussed with Dr. Rogue Bussing.   I again called and spoke with Mitzy, RN. Updated her on plan.   PLAN: -ABD XR -Dexamethasone 2mg  daily -lactulose 40ml TID PRN for constipation  Patient expressed understanding and was in agreement with this plan. He also understands that He can call clinic at any time with any questions, concerns, or complaints.   Time Total: 60 minutes  Visit consisted of counseling and education dealing with the complex and emotionally intense issues of symptom management and palliative care in the setting of serious and potentially life-threatening illness.Greater than 50%  of this time was spent counseling and coordinating care related to the above assessment and plan.  Signed by: Altha Harm, PhD, NP-C [phone] (Work Cell)

## 2018-07-10 ENCOUNTER — Ambulatory Visit
Admission: RE | Admit: 2018-07-10 | Discharge: 2018-07-10 | Disposition: A | Discharge status: Home / Self Care | Source: Ambulatory Visit | Attending: Internal Medicine | Admitting: Internal Medicine

## 2018-07-10 ENCOUNTER — Inpatient Hospital Stay (HOSPITAL_BASED_OUTPATIENT_CLINIC_OR_DEPARTMENT_OTHER): Admitting: Internal Medicine

## 2018-07-10 ENCOUNTER — Ambulatory Visit: Admission: RE | Admit: 2018-07-10 | Source: Ambulatory Visit

## 2018-07-10 ENCOUNTER — Inpatient Hospital Stay

## 2018-07-10 ENCOUNTER — Other Ambulatory Visit: Payer: Self-pay

## 2018-07-10 VITALS — BP 121/65 | HR 61 | Temp 97.8°F | Resp 20

## 2018-07-10 DIAGNOSIS — C61 Malignant neoplasm of prostate: Secondary | ICD-10-CM

## 2018-07-10 DIAGNOSIS — C7951 Secondary malignant neoplasm of bone: Secondary | ICD-10-CM

## 2018-07-10 DIAGNOSIS — G893 Neoplasm related pain (acute) (chronic): Secondary | ICD-10-CM

## 2018-07-10 DIAGNOSIS — Z515 Encounter for palliative care: Secondary | ICD-10-CM | POA: Diagnosis not present

## 2018-07-10 DIAGNOSIS — K5909 Other constipation: Secondary | ICD-10-CM | POA: Diagnosis not present

## 2018-07-10 LAB — CBC
HCT: 31.3 % — ABNORMAL LOW (ref 39.0–52.0)
Hemoglobin: 10.1 g/dL — ABNORMAL LOW (ref 13.0–17.0)
MCH: 31.1 pg (ref 26.0–34.0)
MCHC: 32.3 g/dL (ref 30.0–36.0)
MCV: 96.3 fL (ref 80.0–100.0)
PLATELETS: 245 10*3/uL (ref 150–400)
RBC: 3.25 MIL/uL — ABNORMAL LOW (ref 4.22–5.81)
RDW: 14.6 % (ref 11.5–15.5)
WBC: 5 10*3/uL (ref 4.0–10.5)
nRBC: 0 % (ref 0.0–0.2)

## 2018-07-10 MED ORDER — ACETAMINOPHEN-CODEINE #3 300-30 MG PO TABS: 1.0000 | ORAL_TABLET | ORAL | 0 refills | Status: DC | PRN

## 2018-07-10 NOTE — Assessment & Plan Note (Addendum)
#   Prostate cancer- STAGE IV; castrate resistant [status post orchiectomy in June 2019]; bulky pelvic mass with involvement of the rectum/bladder.  Progressive disease/worsening  #Currently getting radiation on a palliative basis for symptom control pelvic area.  Patient declined radiation today because of left hip pain [see below].  # Constipation -likely secondary to local invasion of the rectum -discussed regarding limited options including # continued radiation #diversion colostomy.  Dr. Tollie Pizza available during office visit were explained the patient the colostomy procedure.   # Given the fact the patient had bowel movement last night-recommend conservative measures/low residue diet [referral to dietitian]-for now.  # urinary retention second malignancy -status post Foley catheter/urinary colonization.  #Left hip pain-question metastases versus others.  Check pelvic x-rays.  Declines narcotics tramadol; will recommend Tylenol 3 plus every 4 hours.  Prescription given.  #Prognosis overall poor-patient understands that at most he has few months to live.  Patient prefers to continue hospice/hence will not be able to access antiandrogens therapy like apalutamide/ X-tandi etc.   # 40 minutes face-to-face with the patient discussing the above plan of care; more than 50% of time spent on prognosis/ natural history; counseling and coordination.  # DISPOSITION:  # left hip pain- X-rays STAT today;  # follow up TBD  Addendum: Patient left pelvic x-ray negative for any acute fracture of the hip; shows nondisplaced fracture of the pubic rami.  Question source of pain.  Will discuss with radiation oncology regarding further work-up.  Continue Tylenol 3 for this for now.

## 2018-07-10 NOTE — Progress Notes (Signed)
[facility] NOTE  Patient Care Team: Baxter Hire, MD as PCP - General (Internal Medicine)  CHIEF COMPLAINTS/PURPOSE OF CONSULTATION:  Prostate cancer  #  Oncology History   # MARCH 2019 PROSTATE CANCER [Glason score: 5+5]; psa- 19;  s/p bil orchiectomy [june 2019; Dr.Wolfe];   # Progression/constipation- SEP 2019- large pelvic mass involving rectum/bladder; PA- 4.2.   # sep19th- Apalutamide [samples] 4/day.x2 days; stopped/hospice  # acute urinary retention s/p foley   DIAGNOSIS: CASTRATE RESISTANT PROSTATE CANCER  STAGE: IV  ;GOALS:Palliative  CURRENT/MOST RECENT THERAPY: Hospice      Prostate cancer ([place])     HISTORY OF PRESENTING ILLNESS:  Theodore Padilla 85 y.o.  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  Patient is currently in hospice at home.    Patient started radiation to his pelvic/prostate area to help with pain/possibly relieve constipation.  Patient had radiation for about 4 fractions today so far.  Patient is quite frustrated without lack of improvement with his symptoms of constipation.  Patient was constipated for about 5 to 6 days; and finally had a bowel movement last night.  His biggest complaint today is ongoing left hip pain.  In fact he declined radiation today because of left hip pain.  Patient is concerned that radiation causing his left hip pain.  Complains of poor appetite.  Patient has been taking Tylenol ibuprofen for pain.  Review of Systems  Constitutional: Positive for malaise/fatigue and weight loss. Negative for chills, diaphoresis and fever.  HENT: Negative for nosebleeds and sore throat.   Eyes: Negative for double vision.  Respiratory: Negative for cough, hemoptysis, sputum production, shortness of breath and wheezing.   Cardiovascular: Negative for chest pain, palpitations, orthopnea and leg swelling.  Gastrointestinal: Positive  for constipation. Negative for abdominal pain, blood in stool, diarrhea, heartburn, melena, nausea and vomiting.  Genitourinary: Negative for dysuria, frequency and urgency.  Musculoskeletal: Positive for back pain and joint pain.  Skin: Negative.  Negative for itching and rash.  Neurological: Negative for dizziness, tingling, focal weakness, weakness and headaches.  Endo/Heme/Allergies: Does not bruise/bleed easily.  Psychiatric/Behavioral: Negative for depression. The patient is not nervous/anxious and does not have insomnia.      MEDICAL HISTORY:  Past Medical History:  Diagnosis Date  . Arthritis   . B12 deficiency   . BPH (benign prostatic hyperplasia)   . Chronic constipation   . Coronary artery disease    with stenting x 1  . Degeneration of spine   . Gout   . Hearing aid worn    bilateral  . History of heart artery stent 2002  . History of kidney stones   . HLD (hyperlipidemia)   . Hypertension   . Lower extremity edema 2019  . Moderate mitral insufficiency   . Moderate tricuspid insufficiency   . Osteoporosis   . Prostate cancer ([place])   . Shortness of breath    occasional  . Sleep apnea    no CPAP, pt denies OSA  . Spine deformity 2019   has severe back pain. previously had surgery without success for improvement  . Urinary hesitancy   . Urinary retention   . Wears dentures    partial upper and lower    SURGICAL HISTORY: Past Surgical History:  Procedure Laterality Date  . APPENDECTOMY    . BACK SURGERY    . CARDIAC CATHETERIZATION  2000   with stent  . CATARACT EXTRACTION  W/PHACO Right 04/18/2017   Procedure: CATARACT EXTRACTION PHACO AND INTRAOCULAR LENS PLACEMENT ([place]) RIGHT;  Surgeon: Leandrew Koyanagi, MD;  Location: [facility];  Service: Ophthalmology;  Laterality: Right;  . CATARACT EXTRACTION W/PHACO Left 05/23/2017   Procedure: CATARACT EXTRACTION PHACO AND INTRAOCULAR LENS PLACEMENT ([place]) LEFT;  Surgeon: Leandrew Koyanagi, MD;   Location: [facility];  Service: Ophthalmology;  Laterality: Left;  . ESOPHAGOGASTRODUODENOSCOPY (EGD) WITH PROPOFOL N/A 07/12/2016   Procedure: ESOPHAGOGASTRODUODENOSCOPY (EGD) WITH PROPOFOL;  Surgeon: Jonathon Bellows, MD;  Location: [facility];  Service: Endoscopy;  Laterality: N/A;  . FOREIGN BODY REMOVAL     L knee  . HERNIA REPAIR     umbilicus (below)   . JOINT REPLACEMENT Bilateral    total hip arthroplasty  . KIDNEY STONE SURGERY    . LITHOTRIPSY    . LUMBAR LAMINECTOMY/DECOMPRESSION MICRODISCECTOMY  01/22/2012   Procedure: LUMBAR LAMINECTOMY/DECOMPRESSION MICRODISCECTOMY 1 LEVEL;  Surgeon: Ophelia Charter, MD;  Location: [facility];  Service: Neurosurgery;  Laterality: N/A;  Lumbar two and lumbar three laminectomies  . ORCHIECTOMY Bilateral 10/16/2017   Procedure: ORCHIECTOMY;  Surgeon: Royston Cowper, MD;  Location: [facility];  Service: Urology;  Laterality: Bilateral;  . TONSILLECTOMY    . TOTAL HIP ARTHROPLASTY     bilat    SOCIAL HISTORY: . Social History   Socioeconomic History  . Marital status: Widowed    Spouse name: Not on file  . Number of children: Not on file  . Years of education: Not on file  . Highest education level: Not on file  Occupational History  . Occupation: retired  Scientific laboratory technician  . Financial resource strain: Not on file  . Food insecurity:    Worry: Not on file    Inability: Not on file  . Transportation needs:    Medical: Not on file    Non-medical: Not on file  Tobacco Use  . Smoking status: Former Smoker    Packs/day: 1.00    Years: 10.00    Pack years: 10.00    Last attempt to quit: 05/16/1963    Years since quitting: 55.1  . Smokeless tobacco: Never Used  . Tobacco comment: quit 1960  Substance and Sexual Activity  . Alcohol use: Yes    Alcohol/week: 0.0 standard drinks    Comment: very rarely  . Drug use: No  . Sexual activity: Not Currently  Lifestyle  . Physical activity:    Days per week: Not on file    Minutes  per session: Not on file  . Stress: Not on file  Relationships  . Social connections:    Talks on phone: Not on file    Gets together: Not on file    Attends religious service: Not on file    Active member of club or organization: Not on file    Attends meetings of clubs or organizations: Not on file    Relationship status: Not on file  . Intimate partner violence:    Fear of current or ex partner: Not on file    Emotionally abused: Not on file    Physically abused: Not on file    Forced sexual activity: Not on file  Other Topics Concern  . Not on file  Social History Narrative   Patient goes around with a wheelchair-secondary to arthritis/debility.  He lives by himself.  His friends help him with outside chores.  Remote history of smoking no alcohol    FAMILY HISTORY: Family History  Problem Relation  Age of Onset  . Kidney Stones Other   . Kidney disease Neg Hx   . Prostate cancer Neg Hx     ALLERGIES:  is allergic to ciprofloxacin; penicillins; allopurinol; pantoprazole; rofecoxib; and tramadol.  MEDICATIONS:  Current Outpatient Medications  Medication Sig Dispense Refill  . dexamethasone (DECADRON) 4 MG tablet Take 0.5 tablets (2 mg total) by mouth daily. 30 tablet 0  . hydrochlorothiazide (HYDRODIURIL) 12.5 MG tablet Take 12.5 mg by mouth daily.  11  . lactulose (CHRONULAC) 10 GM/15ML solution Take 30 mLs (20 g total) by mouth 2 (two) times daily as needed for mild constipation. 236 mL 0  . magnesium hydroxide (MILK OF MAGNESIA) 800 MG/5ML suspension Take 5 mLs by mouth daily as needed for constipation.    . Misc Natural Products (TART CHERRY ADVANCED PO) Take 1 capsule by mouth daily as needed (gout pain).    . Multiple Vitamin (MULTIVITAMIN WITH MINERALS) TABS tablet Take 1 tablet by mouth daily.    Marland Kitchen acetaminophen-codeine (TYLENOL #3) 300-30 MG tablet Take 1 tablet by mouth every 4 (four) hours as needed for moderate pain. 45 tablet 0  . [employer] BISACODYL 5 MG EC tablet  Take 2 tablets by mouth daily.     Current Facility-Administered Medications  Medication Dose Route Frequency Provider Last Rate Last Dose  . polyethylene glycol (MIRALAX / GLYCOLAX) packet 17 g  17 g Oral Daily Jonathon Bellows, MD          .  PHYSICAL EXAMINATION: ECOG PERFORMANCE STATUS: 2 - Symptomatic, <50% confined to bed  Vitals:   07/10/18 1422  BP: 121/65  Pulse: 61  Resp: 20  Temp: 97.8 F (36.6 C)   There were no vitals filed for this visit.  Physical Exam  Constitutional: He is oriented to person, place, and time.  Elderly frail appearing male patient; in a wheelchair.  He is alone.  HENT:  Head: Normocephalic and atraumatic.  Mouth/Throat: Oropharynx is clear and moist. No oropharyngeal exudate.  Eyes: Pupils are equal, round, and reactive to light.  Neck: Normal range of motion. Neck supple.  Cardiovascular: Normal rate and regular rhythm.  Pulmonary/Chest: Breath sounds normal. No respiratory distress. He has no wheezes.  Abdominal: Soft. Bowel sounds are normal. He exhibits no distension and no mass. There is no abdominal tenderness. There is no rebound and no guarding.  Genitourinary:    Genitourinary Comments: Rectal exam deferred.  Foley catheter in place.   Musculoskeletal: Normal range of motion.        General: No tenderness or edema.  Neurological: He is alert and oriented to person, place, and time.  Skin: Skin is warm.  Psychiatric: Affect normal.     LABORATORY DATA:  I have reviewed the data as listed Lab Results  Component Value Date   WBC 5.0 07/10/2018   HGB 10.1 (L) 07/10/2018   HCT 31.3 (L) 07/10/2018   MCV 96.3 07/10/2018   PLT 245 07/10/2018   Recent Labs    08/14/17 1106 08/31/17 1012 10/16/17 1538 02/15/18 0816  NA  --  138 140 141  K  --  4.1 5.1 4.4  CL  --  105  --  103  CO2  --  27  --  30  GLUCOSE  --  96 111* 156*  BUN  --  31*  --  19  CREATININE 1.40* 1.35*  --  1.20  CALCIUM  --  8.5*  --  9.2  GFRNONAA  --  44*  --  50*  GFRAA  --  50*  --  58*  PROT  --   --   --  7.2  ALBUMIN  --   --   --  3.0*  AST  --   --   --  21  ALT  --   --   --  12  ALKPHOS  --   --   --  58  BILITOT  --   --   --  0.5    RADIOGRAPHIC STUDIES: I have personally reviewed the radiological images as listed and agreed with the findings in the report. Dg Abd 2 Views  Result Date: 07/09/2018 CLINICAL DATA:  Constipation in a patient with a history of prostate carcinoma resulting in compression of the rectum. EXAM: ABDOMEN - 2 VIEW COMPARISON:  CT abdomen and pelvis 01/25/2018. FINDINGS: The bowel gas pattern is normal. There is no evidence of free air. No radio-opaque calculi or other significant radiographic abnormality is seen. Bilateral hip replacements are noted. There is convex right scoliosis. Foley catheter is in place. IMPRESSION: No acute abnormality.  Negative for bowel obstruction. Electronically Signed   By: Inge Rise M.D.   On: 07/09/2018 16:00   Dg Hip Unilat W Or W/o Pelvis 2-3 Views Left  Result Date: 07/10/2018 CLINICAL DATA:  Left hip pain. EXAM: DG HIP (WITH OR WITHOUT PELVIS) 2-3V LEFT COMPARISON:  Radiographs of July 11, 2016. FINDINGS: Status post bilateral total hip arthroplasties. Mildly displaced fracture is seen involving the left superior pubic ramus. No dislocation is noted. IMPRESSION: Mildly displaced fracture involving left superior pubic ramus. Electronically Signed   By: Marijo Conception, M.D.   On: 07/10/2018 15:44    ASSESSMENT & PLAN:   Prostate cancer [facility]) # Prostate cancer- STAGE IV; castrate resistant [status post orchiectomy in June 2019]; bulky pelvic mass with involvement of the rectum/bladder.  Progressive disease/worsening  #Currently getting radiation on a palliative basis for symptom control pelvic area.  Patient declined radiation today because of left hip pain [see below].  # Constipation -likely secondary to local invasion of the rectum -discussed regarding  limited options including # continued radiation #diversion colostomy.  Dr. Tollie Pizza available during office visit were explained the patient the colostomy procedure.   # Given the fact the patient had bowel movement last night-recommend conservative measures/low residue diet [referral to dietitian]-for now.  # urinary retention second malignancy -status post Foley catheter/urinary colonization.  #Left hip pain-question metastases versus others.  Check pelvic x-rays.  Declines narcotics tramadol; will recommend Tylenol 3 plus every 4 hours.  Prescription given.  #Prognosis overall poor-patient understands that at most he has few months to live.  Patient prefers to continue hospice/hence will not be able to access antiandrogens therapy like apalutamide/ X-tandi etc.   # 40 minutes face-to-face with the patient discussing the above plan of care; more than 50% of time spent on prognosis/ natural history; counseling and coordination.  # DISPOSITION:  # left hip pain- X-rays STAT today;  # follow up TBD  Addendum: Patient left pelvic x-ray negative for any acute fracture of the hip; shows nondisplaced fracture of the pubic rami.  Question source of pain.  Will discuss with radiation oncology regarding further work-up.  Continue Tylenol 3 for this for now.  All questions were answered. The patient knows to call the clinic with any problems, questions or concerns.    Cammie Sickle, MD 07/10/2018 8:35 PM

## 2018-07-11 ENCOUNTER — Other Ambulatory Visit: Payer: Self-pay | Admitting: Hospice and Palliative Medicine

## 2018-07-11 ENCOUNTER — Telehealth: Payer: Self-pay | Admitting: Internal Medicine

## 2018-07-11 ENCOUNTER — Ambulatory Visit

## 2018-07-11 ENCOUNTER — Telehealth: Payer: Self-pay | Admitting: *Deleted

## 2018-07-11 ENCOUNTER — Inpatient Hospital Stay

## 2018-07-11 MED ORDER — MORPHINE SULFATE (CONCENTRATE) 10 MG /0.5 ML PO SOLN: 5.0000 mg | ORAL | 0 refills | Status: DC | PRN

## 2018-07-11 NOTE — Telephone Encounter (Signed)
Spoke to pt re: results of X-rays-nondisplaced fracture of the left pubic rami.  Patient not interested in further radiation given his ongoing pain.  Interested in narcotic pain medications-to control the pain/understanding that it could make his constipation worse-Eventual demise.  Patient understand that he might need admission to hospice home for pain control/or if symptoms get worse.  Josh-please call me Mitzi/patient's hospice nurse; and prescribe pain medication to control the pain.  Thanks, GB

## 2018-07-11 NOTE — Telephone Encounter (Addendum)
Mitzi called reporting that patient told hjer he has a fractured pelvis and she was to call us. Please return her call [phone]

## 2018-07-11 NOTE — Telephone Encounter (Signed)
Spoke with RN. Rx sent for morphine elixir.

## 2018-07-11 NOTE — Progress Notes (Signed)
I spoke with Mitzi, RN with hospice. Discussed results of imaging and Dr. Aletha Halim conversation with patient about comfort measures. I prescribed morphine elixir 5-10mg  Q4H prn. Discussed possible need to transfer patient to the Hospice Home at first sign of decline. Hospice is monitoring.   Plan: Hospice following Morphine elixir (20mg /mL) 5-10mg  (0.25-0.27mL) PO Q4H PRN for pain/dyspnea (Rx #63mL) Continue bowel regimen Consider hospice home

## 2018-07-12 ENCOUNTER — Ambulatory Visit

## 2018-07-12 ENCOUNTER — Inpatient Hospital Stay

## 2018-07-15 ENCOUNTER — Ambulatory Visit: Payer: Medicare Other

## 2018-07-15 ENCOUNTER — Inpatient Hospital Stay

## 2018-07-16 ENCOUNTER — Inpatient Hospital Stay: Payer: Medicare Other

## 2018-07-16 ENCOUNTER — Encounter: Payer: Self-pay | Admitting: Hospice and Palliative Medicine

## 2018-07-16 ENCOUNTER — Ambulatory Visit: Payer: Medicare Other

## 2018-07-16 ENCOUNTER — Inpatient Hospital Stay

## 2018-07-16 ENCOUNTER — Inpatient Hospital Stay: Attending: Hospice and Palliative Medicine | Admitting: Hospice and Palliative Medicine

## 2018-07-16 VITALS — BP 112/75 | HR 62 | Temp 97.6°F

## 2018-07-16 DIAGNOSIS — C7951 Secondary malignant neoplasm of bone: Secondary | ICD-10-CM | POA: Insufficient documentation

## 2018-07-16 DIAGNOSIS — N400 Enlarged prostate without lower urinary tract symptoms: Secondary | ICD-10-CM | POA: Insufficient documentation

## 2018-07-16 DIAGNOSIS — Z87442 Personal history of urinary calculi: Secondary | ICD-10-CM | POA: Insufficient documentation

## 2018-07-16 DIAGNOSIS — I2510 Atherosclerotic heart disease of native coronary artery without angina pectoris: Secondary | ICD-10-CM | POA: Insufficient documentation

## 2018-07-16 DIAGNOSIS — Z515 Encounter for palliative care: Secondary | ICD-10-CM | POA: Diagnosis not present

## 2018-07-16 DIAGNOSIS — C61 Malignant neoplasm of prostate: Secondary | ICD-10-CM | POA: Diagnosis not present

## 2018-07-16 DIAGNOSIS — E785 Hyperlipidemia, unspecified: Secondary | ICD-10-CM | POA: Insufficient documentation

## 2018-07-16 DIAGNOSIS — R339 Retention of urine, unspecified: Secondary | ICD-10-CM | POA: Insufficient documentation

## 2018-07-16 DIAGNOSIS — M109 Gout, unspecified: Secondary | ICD-10-CM | POA: Diagnosis not present

## 2018-07-16 DIAGNOSIS — Z79899 Other long term (current) drug therapy: Secondary | ICD-10-CM | POA: Diagnosis not present

## 2018-07-16 DIAGNOSIS — G4730 Sleep apnea, unspecified: Secondary | ICD-10-CM | POA: Diagnosis not present

## 2018-07-16 DIAGNOSIS — G893 Neoplasm related pain (acute) (chronic): Secondary | ICD-10-CM

## 2018-07-16 DIAGNOSIS — E538 Deficiency of other specified B group vitamins: Secondary | ICD-10-CM | POA: Diagnosis not present

## 2018-07-16 DIAGNOSIS — M129 Arthropathy, unspecified: Secondary | ICD-10-CM | POA: Diagnosis not present

## 2018-07-16 DIAGNOSIS — R609 Edema, unspecified: Secondary | ICD-10-CM | POA: Insufficient documentation

## 2018-07-16 DIAGNOSIS — K5900 Constipation, unspecified: Secondary | ICD-10-CM | POA: Insufficient documentation

## 2018-07-16 DIAGNOSIS — M810 Age-related osteoporosis without current pathological fracture: Secondary | ICD-10-CM | POA: Diagnosis not present

## 2018-07-16 NOTE — Progress Notes (Signed)
[place]  [phone] [phone]   Name: Theodore Padilla Date: 07/16/2018 MRN: [id number]  DOB: August 04, 1921  Patient Care Team: Baxter Hire, MD as PCP - General (Internal Medicine)    REASON FOR CONSULTATION: Palliative Care consult requested for this 85 y.o. male with multiple medical problems including stage IV prostate cancer status post bilateral orchiectomy (June 2019) with bone mets, who is currently followed by hospice at home.  Patient has a large progressive pelvic mass causing severe constipation and urinary retention. He has an indwelling Foley catheter. Patient has had severe pain and is currently receiving RT to his pelvic mass. Patient was referred to palliative care to help address symptoms.   SOCIAL HISTORY:    Patient lives at home alone. He has a nephew who is involved. Patient is followed at home by hospice.   ADVANCE DIRECTIVES:  Not on file  CODE STATUS: DNR  PAST MEDICAL HISTORY: Past Medical History:  Diagnosis Date  . Arthritis   . B12 deficiency   . BPH (benign prostatic hyperplasia)   . Chronic constipation   . Coronary artery disease    with stenting x 1  . Degeneration of spine   . Gout   . Hearing aid worn    bilateral  . History of heart artery stent 2002  . History of kidney stones   . HLD (hyperlipidemia)   . Hypertension   . Lower extremity edema 2019  . Moderate mitral insufficiency   . Moderate tricuspid insufficiency   . Osteoporosis   . Prostate cancer ([place])   . Shortness of breath    occasional  . Sleep apnea    no CPAP, pt denies OSA  . Spine deformity 2019   has severe back pain. previously had surgery without success for improvement  . Urinary hesitancy   . Urinary retention   . Wears dentures    partial upper and lower    PAST SURGICAL HISTORY:  Past Surgical History:  Procedure Laterality Date  . APPENDECTOMY    . BACK SURGERY    . CARDIAC  CATHETERIZATION  2000   with stent  . CATARACT EXTRACTION W/PHACO Right 04/18/2017   Procedure: CATARACT EXTRACTION PHACO AND INTRAOCULAR LENS PLACEMENT ([place]) RIGHT;  Surgeon: Leandrew Koyanagi, MD;  Location: [facility];  Service: Ophthalmology;  Laterality: Right;  . CATARACT EXTRACTION W/PHACO Left 05/23/2017   Procedure: CATARACT EXTRACTION PHACO AND INTRAOCULAR LENS PLACEMENT ([place]) LEFT;  Surgeon: Leandrew Koyanagi, MD;  Location: [facility];  Service: Ophthalmology;  Laterality: Left;  . ESOPHAGOGASTRODUODENOSCOPY (EGD) WITH PROPOFOL N/A 07/12/2016   Procedure: ESOPHAGOGASTRODUODENOSCOPY (EGD) WITH PROPOFOL;  Surgeon: Jonathon Bellows, MD;  Location: [facility];  Service: Endoscopy;  Laterality: N/A;  . FOREIGN BODY REMOVAL     L knee  . HERNIA REPAIR     umbilicus (below)   . JOINT REPLACEMENT Bilateral    total hip arthroplasty  . KIDNEY STONE SURGERY    . LITHOTRIPSY    . LUMBAR LAMINECTOMY/DECOMPRESSION MICRODISCECTOMY  01/22/2012   Procedure: LUMBAR LAMINECTOMY/DECOMPRESSION MICRODISCECTOMY 1 LEVEL;  Surgeon: Ophelia Charter, MD;  Location: [facility];  Service: Neurosurgery;  Laterality: N/A;  Lumbar two and lumbar three laminectomies  . ORCHIECTOMY Bilateral 10/16/2017   Procedure: ORCHIECTOMY;  Surgeon: Royston Cowper, MD;  Location: [facility];  Service: Urology;  Laterality: Bilateral;  . TONSILLECTOMY    . TOTAL HIP ARTHROPLASTY     bilat  HEMATOLOGY/ONCOLOGY HISTORY:  Oncology History   # MARCH 2019 PROSTATE CANCER [Glason score: 5+5]; psa- 19;  s/p bil orchiectomy [june 2019; Dr.Wolfe];   # Progression/constipation- SEP 2019- large pelvic mass involving rectum/bladder; PA- 4.2.   # sep19th- Apalutamide [samples] 4/day.x2 days; stopped/hospice  # acute urinary retention s/p foley   DIAGNOSIS: CASTRATE RESISTANT PROSTATE CANCER  STAGE: IV  ;GOALS:Palliative  CURRENT/MOST RECENT THERAPY: Hospice      Prostate cancer ([place])     ALLERGIES:  is allergic to ciprofloxacin; penicillins; allopurinol; pantoprazole; rofecoxib; and tramadol.  MEDICATIONS:  Current Outpatient Medications  Medication Sig Dispense Refill  . acetaminophen-codeine (TYLENOL #3) 300-30 MG tablet Take 1 tablet by mouth every 4 (four) hours as needed for moderate pain. 45 tablet 0  . [employer] BISACODYL 5 MG EC tablet Take 2 tablets by mouth daily.    Marland Kitchen dexamethasone (DECADRON) 4 MG tablet Take 0.5 tablets (2 mg total) by mouth daily. 30 tablet 0  . hydrochlorothiazide (HYDRODIURIL) 12.5 MG tablet Take 12.5 mg by mouth daily.  11  . lactulose (CHRONULAC) 10 GM/15ML solution Take 30 mLs (20 g total) by mouth 2 (two) times daily as needed for mild constipation. 236 mL 0  . magnesium hydroxide (MILK OF MAGNESIA) 800 MG/5ML suspension Take 5 mLs by mouth daily as needed for constipation.    . Misc Natural Products (TART CHERRY ADVANCED PO) Take 1 capsule by mouth daily as needed (gout pain).    . Morphine Sulfate (MORPHINE CONCENTRATE) 10 mg / 0.5 ml concentrated solution Take 0.25-0.5 mLs (5-10 mg total) by mouth every 4 (four) hours as needed for moderate pain, severe pain or shortness of breath. 30 mL 0  . Multiple Vitamin (MULTIVITAMIN WITH MINERALS) TABS tablet Take 1 tablet by mouth daily.     Current Facility-Administered Medications  Medication Dose Route Frequency Provider Last Rate Last Dose  . polyethylene glycol (MIRALAX / GLYCOLAX) packet 17 g  17 g Oral Daily Jonathon Bellows, MD        VITAL SIGNS: There were no vitals taken for this visit. There were no vitals filed for this visit.  Estimated body mass index is 31.31 kg/m as calculated from the following:   Height as of 05/30/18: 5' 4.75" (1.645 m).   Weight as of 05/30/18: 186 lb 11.2 oz (84.7 kg).  LABS: CBC:    Component Value Date/Time   WBC 5.0 07/10/2018 1356   HGB 10.1 (L) 07/10/2018 1356   HGB 8.4 (L) 08/31/2014 0327   HCT 31.3 (L) 07/10/2018 1356   HCT 26.4 (L) 08/30/2014  0354   PLT 245 07/10/2018 1356   PLT 171 08/30/2014 0354   MCV 96.3 07/10/2018 1356   MCV 97 08/30/2014 0354   NEUTROABS 2.9 02/15/2018 0816   NEUTROABS 5.6 08/30/2014 0354   LYMPHSABS 1.0 02/15/2018 0816   LYMPHSABS 1.4 08/30/2014 0354   MONOABS 0.4 02/15/2018 0816   MONOABS 0.6 08/30/2014 0354   EOSABS 0.4 02/15/2018 0816   EOSABS 0.2 08/30/2014 0354   BASOSABS 0.1 02/15/2018 0816   BASOSABS 0.0 08/30/2014 0354   Comprehensive Metabolic Panel:    Component Value Date/Time   NA 141 02/15/2018 0816   NA 139 08/30/2014 0354   K 4.4 02/15/2018 0816   K 4.4 08/30/2014 0354   CL 103 02/15/2018 0816   CL 111 08/30/2014 0354   CO2 30 02/15/2018 0816   CO2 23 08/30/2014 0354   BUN 19 02/15/2018 0816   BUN 72 (H) 08/30/2014 0354  CREATININE 1.20 02/15/2018 0816   CREATININE 1.15 08/30/2014 0354   GLUCOSE 156 (H) 02/15/2018 0816   GLUCOSE 106 (H) 08/30/2014 0354   CALCIUM 9.2 02/15/2018 0816   CALCIUM 7.9 (L) 08/30/2014 0354   AST 21 02/15/2018 0816   AST 19 08/29/2014 1007   ALT 12 02/15/2018 0816   ALT 14 (L) 08/29/2014 1007   ALKPHOS 58 02/15/2018 0816   ALKPHOS 50 08/29/2014 1007   BILITOT 0.5 02/15/2018 0816   BILITOT 0.5 08/29/2014 1007   PROT 7.2 02/15/2018 0816   PROT 5.8 (L) 08/29/2014 1007   ALBUMIN 3.0 (L) 02/15/2018 0816   ALBUMIN 3.1 (L) 08/29/2014 1007    RADIOGRAPHIC STUDIES: Dg Abd 2 Views  Result Date: 07/09/2018 CLINICAL DATA:  Constipation in a patient with a history of prostate carcinoma resulting in compression of the rectum. EXAM: ABDOMEN - 2 VIEW COMPARISON:  CT abdomen and pelvis 01/25/2018. FINDINGS: The bowel gas pattern is normal. There is no evidence of free air. No radio-opaque calculi or other significant radiographic abnormality is seen. Bilateral hip replacements are noted. There is convex right scoliosis. Foley catheter is in place. IMPRESSION: No acute abnormality.  Negative for bowel obstruction. Electronically Signed   By: Inge Rise M.D.   On: 07/09/2018 16:00   Dg Hip Unilat W Or W/o Pelvis 2-3 Views Left  Result Date: 07/10/2018 CLINICAL DATA:  Left hip pain. EXAM: DG HIP (WITH OR WITHOUT PELVIS) 2-3V LEFT COMPARISON:  Radiographs of July 11, 2016. FINDINGS: Status post bilateral total hip arthroplasties. Mildly displaced fracture is seen involving the left superior pubic ramus. No dislocation is noted. IMPRESSION: Mildly displaced fracture involving left superior pubic ramus. Electronically Signed   By: Marijo Conception, M.D.   On: 07/10/2018 15:44    PERFORMANCE STATUS (ECOG) : 2 - Symptomatic, <50% confined to bed  Review of Systems As noted above. Otherwise, a complete review of systems is negative.  Physical Exam General: NAD, frail appearing, thin Cardiovascular: regular rate and rhythm Pulmonary: clear ant fields Abdomen: soft, nontender, hypoactive bowel sounds GU: no suprapubic tenderness, Foley noted Extremities: trace edema BLEs Skin: no rashes Neurological: Weakness but otherwise nonfocal  IMPRESSION: Patient presented to the clinic today for follow-up regarding his pain.  Patient says that he has continued to have pain in the left hip.  Again reviewed with him the results of the imaging.  Patient somewhat incredulous that he could have fractured the hip without noted injury or trauma.  Patient says that he is taking the morphine once daily for pain and that it helps.  I encouraged him to use the morphine more frequently if needed but patient seems rather reluctant to do so.  Patient says his constipation is better controlled on current bowel regimen.  Last bowel movement 2 days ago.  We talked about his lack of social support in the home.  Patient is followed by hospice at home.  Patient would be interested in transition to a facility or residential hospice in the event of persistent decline.  I called and left a message for patient's hospice nurse, [place] [phone]).    PLAN: -Hospice at home -Morphine prn for pain -Continue bowel regimen -Consider transition to hospice facility in the event of clinical decline -RTC as needed  Patient expressed understanding and was in agreement with this plan. He also understands that He can call clinic at any time with any questions, concerns, or complaints.   Time Total: 20 minutes  Visit consisted  of counseling and education dealing with the complex and emotionally intense issues of symptom management and palliative care in the setting of serious and potentially life-threatening illness.Greater than 50%  of this time was spent counseling and coordinating care related to the above assessment and plan.  Signed by: Altha Harm, PhD, NP-C [phone] (Work Cell)

## 2018-07-17 ENCOUNTER — Inpatient Hospital Stay

## 2018-07-17 ENCOUNTER — Ambulatory Visit: Payer: Medicare Other

## 2018-07-18 ENCOUNTER — Ambulatory Visit: Payer: Medicare Other

## 2018-07-18 ENCOUNTER — Inpatient Hospital Stay

## 2018-07-19 ENCOUNTER — Inpatient Hospital Stay

## 2018-07-19 ENCOUNTER — Ambulatory Visit: Payer: Medicare Other

## 2018-07-22 ENCOUNTER — Inpatient Hospital Stay

## 2018-07-22 ENCOUNTER — Ambulatory Visit: Payer: Medicare Other

## 2018-07-23 ENCOUNTER — Inpatient Hospital Stay

## 2018-07-23 ENCOUNTER — Ambulatory Visit: Payer: Medicare Other

## 2018-07-24 ENCOUNTER — Inpatient Hospital Stay

## 2018-07-24 ENCOUNTER — Ambulatory Visit: Payer: Medicare Other

## 2018-07-25 ENCOUNTER — Inpatient Hospital Stay

## 2018-07-25 ENCOUNTER — Ambulatory Visit: Payer: Medicare Other

## 2018-07-26 ENCOUNTER — Ambulatory Visit: Payer: Medicare Other

## 2018-07-26 ENCOUNTER — Inpatient Hospital Stay

## 2018-07-29 ENCOUNTER — Ambulatory Visit: Payer: Medicare Other

## 2018-07-30 ENCOUNTER — Ambulatory Visit: Payer: Medicare Other

## 2018-08-09 ENCOUNTER — Telehealth: Payer: Self-pay | Admitting: *Deleted

## 2018-08-09 NOTE — Telephone Encounter (Signed)
Hospice nurse called to request antibiotics for this patient reporting that his Foley Cath had to be replaced after 1 week due to it not draining, reinserting it was painful to patient this time and what is draining is clots and turbid urine. Please advise

## 2018-08-09 NOTE — Telephone Encounter (Signed)
Josh- Can you please address this pt hospice RN? Thanks

## 2018-08-09 NOTE — Telephone Encounter (Signed)
Thanks, Josh. I agree with you. GB

## 2018-08-09 NOTE — Telephone Encounter (Signed)
I called and spoke with Mitzi, RN. Foley was not draining and had to be replaced. Patient had some pain and discomfort associated with removing his Foley. He has since had some clots and cloudy urine. No fevers or chills reported. No residual SP tenderness. Suspect that he has clotting due to disease progression vs trauma from Foley exchange.   Plan: Foley irrigation daily x 3 days Will check UA

## 2018-08-15 ENCOUNTER — Telehealth (HOSPITAL_BASED_OUTPATIENT_CLINIC_OR_DEPARTMENT_OTHER): Admitting: Hospice and Palliative Medicine

## 2018-08-15 ENCOUNTER — Other Ambulatory Visit: Payer: Self-pay | Admitting: Internal Medicine

## 2018-08-15 DIAGNOSIS — N390 Urinary tract infection, site not specified: Secondary | ICD-10-CM

## 2018-08-15 DIAGNOSIS — C61 Malignant neoplasm of prostate: Secondary | ICD-10-CM | POA: Diagnosis not present

## 2018-08-15 DIAGNOSIS — Z515 Encounter for palliative care: Secondary | ICD-10-CM

## 2018-08-15 DIAGNOSIS — C7981 Secondary malignant neoplasm of breast: Secondary | ICD-10-CM | POA: Diagnosis not present

## 2018-08-15 MED ORDER — SULFAMETHOXAZOLE-TRIMETHOPRIM 800-160 MG PO TABS: 1.0000 | ORAL_TABLET | Freq: Two times a day (BID) | ORAL | 0 refills | Status: DC

## 2018-08-15 NOTE — Progress Notes (Signed)
Virtual Visit via Telephone Note  I connected with Theodore Padilla on 08/15/18 at  1:00 PM EDT by telephone and verified that I am speaking with the correct person using two identifiers.   I discussed the limitations, risks, security and privacy concerns of performing an evaluation and management service by telephone and the availability of in person appointments. I also discussed with the patient that there may be a patient responsible charge related to this service. The patient expressed understanding and agreed to proceed.   History of Present Illness: Palliative Care consult requested for this 85 y.o. male with multiple medical problems including stage IV prostate cancer status post bilateral orchiectomy (June 2019) with bone mets, who is currently followed by hospice at home.  Patient has a large progressive pelvic mass causing severe constipation and urinary retention. He has an indwelling Foley catheter. Patient has had severe pain is s/p RT to his pelvic mass. Patient was referred to palliative care to help address symptoms.  Hospice nurse reported that patient has had turbid appearing urine, burning, and occasional clots since his Foley was last changed. Patient confirmed this when I called him. He otherwise reports doing well without significant changes/decline. He says pain is well managed with use of PRN acetaminophen. He denies fevers/chills or significant SP pain.    Observations/Objective: UA appears grossly abnormal with +WBC, nitrites, protein, and casts. Urine culture was requested but it appears that this was not appropriately ordered by hospice.   Patient has history of UTIs. He has been tx'd successfully with Bactrim in the past. Note h/o allergies to PCN and cipro.   Assessment and Plan: -Bactrim DS 800-160mg  BID x 7 days -Hospice is following at home -Continue routine Foley care with PRN irrigation     I discussed the assessment and treatment plan with the patient. The  patient was provided an opportunity to ask questions and all were answered. The patient agreed with the plan and demonstrated an understanding of the instructions.   The patient was advised to call back or seek an in-person evaluation if the symptoms worsen or if the condition fails to improve as anticipated.  I provided 10 minutes of non-face-to-face time during this encounter.   Irean Hong, NP

## 2018-09-27 ENCOUNTER — Telehealth: Payer: Self-pay | Admitting: *Deleted

## 2018-09-27 ENCOUNTER — Other Ambulatory Visit: Payer: Self-pay | Admitting: Hospice and Palliative Medicine

## 2018-09-27 MED ORDER — SULFAMETHOXAZOLE-TRIMETHOPRIM 800-160 MG PO TABS: 1.0000 | ORAL_TABLET | Freq: Two times a day (BID) | ORAL | 0 refills | Status: DC

## 2018-09-27 NOTE — Telephone Encounter (Signed)
I spoke with the hospice nurse. She is going this afternoon to the patient's home and will call me for orders.

## 2018-09-27 NOTE — Telephone Encounter (Signed)
Josh- please help me with pt's foley cath issues. Thanks GB

## 2018-09-27 NOTE — Telephone Encounter (Signed)
Theodore Padilla with hospice called reporting that patient foley cath is having to be changed out weekly due to getting clogged off by sediment. Urine has a foul odor to it. They are flushing it daily with 50 ml NS. She plans to insert an 18 Fr cath today. She is asking what if anything else can be done to prevent having to change it so frequently. Please advise

## 2018-09-27 NOTE — Progress Notes (Signed)
I spoke with hospice nurse, [place] [phone]). Patient has had recurrent urinary sediment causing encrustation of his catheter. The Foley has required weekly exchanges due to symptoms. Foley was changed last night. Reportedly, urine was foul smelling and pink tinged. Additionally, patient has had flank discomfort. He was last treated for UTI in March.   Suspect complicated cystitis. Likely has colonization causing alkalotic urine/encrustation. He may benefit from suppressive abx. Methenamine could also be considered, although generally prescribed by urology.   Plan: -UA, C&S -Bactrim DS BID x 14 days -Then consider Bactrim DS 1 tablet daily or every other day for suppression -Continue daily Foley irrigation until clear

## 2018-10-01 ENCOUNTER — Encounter: Payer: Self-pay | Admitting: Hospice and Palliative Medicine

## 2018-10-15 ENCOUNTER — Telehealth: Payer: Self-pay | Admitting: Hospice and Palliative Medicine

## 2018-10-15 NOTE — Telephone Encounter (Signed)
I spoke with hospice nurse, [place] [phone]). Patient completed abx course 3 days ago. He has started having return of burning/cloudy urine. It appears that urine culture showed multi-flora but no sensitivity was done. Will reorder.   Plan to start chronic suppression with Bactrim DS 1 tablet every other day.  Case and plan discussed with Dr. Rogue Bussing.

## 2018-10-17 ENCOUNTER — Encounter: Payer: Self-pay | Admitting: Emergency Medicine

## 2018-10-17 ENCOUNTER — Other Ambulatory Visit: Payer: Self-pay

## 2018-10-17 ENCOUNTER — Emergency Department
Admission: EM | Admit: 2018-10-17 | Discharge: 2018-10-17 | Disposition: A | Discharge status: Hospice | Attending: Emergency Medicine | Admitting: Emergency Medicine

## 2018-10-17 DIAGNOSIS — C61 Malignant neoplasm of prostate: Secondary | ICD-10-CM | POA: Diagnosis not present

## 2018-10-17 DIAGNOSIS — Z960 Presence of urogenital implants: Secondary | ICD-10-CM | POA: Diagnosis not present

## 2018-10-17 DIAGNOSIS — D649 Anemia, unspecified: Secondary | ICD-10-CM | POA: Diagnosis not present

## 2018-10-17 DIAGNOSIS — R310 Gross hematuria: Secondary | ICD-10-CM | POA: Insufficient documentation

## 2018-10-17 DIAGNOSIS — R319 Hematuria, unspecified: Secondary | ICD-10-CM | POA: Diagnosis present

## 2018-10-17 HISTORY — DX: Calculus of kidney: N20.0

## 2018-10-17 HISTORY — DX: Neoplasm of unspecified behavior of digestive system: D49.0

## 2018-10-17 LAB — CBC WITH DIFFERENTIAL/PLATELET
Abs Immature Granulocytes: 0.01 10*3/uL (ref 0.00–0.07)
Abs Immature Granulocytes: 0.01 10*3/uL (ref 0.00–0.07)
Basophils Absolute: 0 10*3/uL (ref 0.0–0.1)
Basophils Absolute: 0 10*3/uL (ref 0.0–0.1)
Basophils Relative: 1 %
Basophils Relative: 1 %
Eosinophils Absolute: 0.2 10*3/uL (ref 0.0–0.5)
Eosinophils Absolute: 0.2 10*3/uL (ref 0.0–0.5)
Eosinophils Relative: 4 %
Eosinophils Relative: 4 %
HCT: 30.2 % — ABNORMAL LOW (ref 39.0–52.0)
HCT: 28.8 % — ABNORMAL LOW (ref 39.0–52.0)
Hemoglobin: 9.9 g/dL — ABNORMAL LOW (ref 13.0–17.0)
Hemoglobin: 9.3 g/dL — ABNORMAL LOW (ref 13.0–17.0)
Immature Granulocytes: 0 %
Immature Granulocytes: 0 %
Lymphocytes Relative: 14 %
Lymphocytes Relative: 20 %
Lymphs Abs: 0.7 10*3/uL (ref 0.7–4.0)
Lymphs Abs: 0.9 10*3/uL (ref 0.7–4.0)
MCH: 31.4 pg (ref 26.0–34.0)
MCH: 31.2 pg (ref 26.0–34.0)
MCHC: 32.8 g/dL (ref 30.0–36.0)
MCHC: 32.3 g/dL (ref 30.0–36.0)
MCV: 95.9 fL (ref 80.0–100.0)
MCV: 96.6 fL (ref 80.0–100.0)
Monocytes Absolute: 0.6 10*3/uL (ref 0.1–1.0)
Monocytes Absolute: 0.5 10*3/uL (ref 0.1–1.0)
Monocytes Relative: 12 %
Monocytes Relative: 12 %
Neutro Abs: 3.2 10*3/uL (ref 1.7–7.7)
Neutro Abs: 2.8 10*3/uL (ref 1.7–7.7)
Neutrophils Relative %: 69 %
Neutrophils Relative %: 63 %
Platelets: 254 10*3/uL (ref 150–400)
Platelets: 195 10*3/uL (ref 150–400)
RBC: 3.15 MIL/uL — ABNORMAL LOW (ref 4.22–5.81)
RBC: 2.98 MIL/uL — ABNORMAL LOW (ref 4.22–5.81)
RDW: 14 % (ref 11.5–15.5)
RDW: 13.9 % (ref 11.5–15.5)
WBC: 4.6 10*3/uL (ref 4.0–10.5)
WBC: 4.4 10*3/uL (ref 4.0–10.5)
nRBC: 0 % (ref 0.0–0.2)
nRBC: 0 % (ref 0.0–0.2)

## 2018-10-17 LAB — COMPREHENSIVE METABOLIC PANEL
ALT: 10 U/L (ref 0–44)
AST: 20 U/L (ref 15–41)
Albumin: 2.9 g/dL — ABNORMAL LOW (ref 3.5–5.0)
Alkaline Phosphatase: 70 U/L (ref 38–126)
Anion gap: 10 (ref 5–15)
BUN: 18 mg/dL (ref 8–23)
CO2: 22 mmol/L (ref 22–32)
Calcium: 8.6 mg/dL — ABNORMAL LOW (ref 8.9–10.3)
Chloride: 100 mmol/L (ref 98–111)
Creatinine, Ser: 1.05 mg/dL (ref 0.61–1.24)
GFR calc Af Amer: >60 mL/min (ref >60)
GFR calc non Af Amer: >60 mL/min (ref >60)
Glucose, Bld: 129 mg/dL — ABNORMAL HIGH (ref 70–99)
Potassium: 4.1 mmol/L (ref 3.5–5.1)
Sodium: 132 mmol/L — ABNORMAL LOW (ref 135–145)
Total Bilirubin: 0.7 mg/dL (ref 0.3–1.2)
Total Protein: 7.1 g/dL (ref 6.5–8.1)

## 2018-10-17 LAB — APTT: aPTT: 33 seconds (ref 24–36)

## 2018-10-17 LAB — PROTIME-INR
INR: 1.1 (ref 0.8–1.2)
Prothrombin Time: 13.7 seconds (ref 11.4–15.2)

## 2018-10-17 LAB — TROPONIN I: Troponin I: <0.03 ng/mL (ref <0.03)

## 2018-10-17 LAB — ABO/RH: ABO/RH(D): A POS

## 2018-10-17 MED ORDER — TRANEXAMIC ACID 1000 MG/10ML IV SOLN
1.5000 mg/kg/h | Freq: Once | INTRAVENOUS | Status: DC
  Filled 2018-10-17: qty 25

## 2018-10-17 MED ORDER — SODIUM CHLORIDE 0.9 % IV SOLN
10.0000 mL/h | Freq: Once | INTRAVENOUS | Status: AC
  Administered 2018-10-17: 15:00:00 10 mL/h via INTRAVENOUS

## 2018-10-17 MED ORDER — TRANEXAMIC ACID-NACL 1000-0.7 MG/100ML-% IV SOLN: 1000.0000 mg | Freq: Once | INTRAVENOUS | Status: DC

## 2018-10-17 MED ORDER — TRANEXAMIC ACID-NACL 1000-0.7 MG/100ML-% IV SOLN
1000.0000 mg | Freq: Once | INTRAVENOUS | Status: AC
  Administered 2018-10-17: 17:00:00 1000 mg via INTRAVENOUS

## 2018-10-17 NOTE — ED Notes (Signed)
[facility] present to transport pt to [facility] [phone]). Staff called and notified of pt's departure from ED.

## 2018-10-17 NOTE — ED Notes (Signed)
Triage entered on paper by Derl Barrow, RN during system downtime and transcribed by this RN.

## 2018-10-17 NOTE — ED Notes (Signed)
Verified with MD Archie Balboa that TXA drip should be total dose 1000mg  run over 30 mins through PIV. Old order dc'd and new order placed. 2500mg /237mL bag already mixed and sent by pharmacy, already spiked. This bag to infuse 122mL/1000mg  over 30 mins at rate of 231mL/hr. Pump, dose, and rate verified by this RN and charge RN Nira Conn prior to starting infusion.

## 2018-10-17 NOTE — ED Notes (Signed)
Changed pt, pt had bowel movement

## 2018-10-17 NOTE — Progress Notes (Addendum)
Patient by is currently followed [facility] at home with a hospice diagnosis of prostate cancer. He is  DNR Code with out of facility DNR in place in the home. Patient experienced excessive bleeding following a foley catheter replacement at home. He was directed to go to the ED for possible CBI by the oncology NP. CSW Evette Cristal made aware Update: Writer arrived to the ED at aprox 2:30 pm, patient seen lying on the ED stretcher, alert and oriented, second 3 liter bag running for CBI, urine appeared pink. Per conversation with attending physician Dr. Jimmye Norman., urology had been consulted and did not recommend any other intervention.  Per staff RN Elmo Putt, the bleeding does restart when CBI is paused. Patient did received a dose of transexamic acid to help stem the bleeding.  Writer had multiple conversations with the hospice team, hospice home team, hospital care team, but most importantly with the patient himself. He lives alone and is wheel chair bound, no local family. He does have a friend that checks on him, but he is unable to spend the night. The patient has decided that discharging to the hospice home would be the safest plan for him at this time. Levels of care and room and board charge were discussed and patient expressed understanding.  Report called to the hospice home.  Staff RN Elmo Putt and current EDP Dr. Archie Balboa updated. Report called to the hospice home, EMS to be called by the unit secretary. Signed out of facility DNR in place. EDP note and AVS faxed to the hospice home. Thank you. Flo Shanks BSN, RN, [facility] [phone]

## 2018-10-17 NOTE — Clinical Social Work Note (Signed)
CSW was informed by Santiago Glad at [facility], patient is a current hospice at home patient.  CSW continuing to follow patient's progress throughout discharge planning.  Evette Cristal, MSW, LCSW ED Covering [place] 10/17/2018 2:23 PM

## 2018-10-17 NOTE — ED Triage Notes (Signed)
Pt presents to ED c/o hematuria. Had foley catheter changed yesterday. Blood noted in drainage bag.

## 2018-10-17 NOTE — ED Notes (Signed)
Changed pt, pt had a bowel movement.

## 2018-10-17 NOTE — ED Notes (Signed)
20G PIV in place to LFA, left per hospice home request. 69fr indwelling catheter left in place as ordered.

## 2018-10-17 NOTE — ED Notes (Signed)
2nd 3L bag started for CBI for continued bleeding

## 2018-10-17 NOTE — ED Provider Notes (Signed)
[facility] Emergency Department Provider Note       Time seen: ----------------------------------------- 11:25 AM on 10/17/2018 -----------------------------------------   I have reviewed the triage vital signs and the nursing notes.  HISTORY   Chief Complaint No chief complaint on file.    HPI Theodore Padilla is a 85 y.o. male with a history of prostate cancer and is currently on hospice who presents to the ED for passing bright red blood in his Foley catheter.  Patient states he is emptied his urine collection bag 3 times, it is currently filling with blood again.  He does not take blood thinners.  He denies any trauma.  Patient states he feels weak and like he has been run over by truck  No past medical history on file.  There are no active problems to display for this patient.  Allergies Patient has no allergy information on record.  Social History Social History   Tobacco Use  . Smoking status: Not on file  Substance Use Topics  . Alcohol use: Not on file  . Drug use: Not on file    Review of Systems Constitutional: Negative for fever. Cardiovascular: Negative for chest pain. Respiratory: Negative for shortness of breath. Gastrointestinal: Negative for abdominal pain, vomiting and diarrhea. Genitourinary: Positive for hematuria Musculoskeletal: Negative for back pain. Skin: Negative for rash. Neurological: Positive for generalized weakness  All systems negative/normal/unremarkable except as stated in the HPI  ____________________________________________   PHYSICAL EXAM:  VITAL SIGNS: ED Triage Vitals  Enc Vitals Group     BP      Pulse      Resp      Temp      Temp src      SpO2      Weight      Height      Head Circumference      Peak Flow      Pain Score      Pain Loc      Pain Edu?      Excl. in [place]?    Constitutional: Alert and oriented. Well appearing and in no distress. Eyes: Conjunctivae are normal. Normal  extraocular movements. Cardiovascular: Normal rate, regular rhythm. No murmurs, rubs, or gallops. Respiratory: Normal respiratory effort without tachypnea nor retractions. Breath sounds are clear and equal bilaterally. No wheezes/rales/rhonchi. Gastrointestinal: Soft and nontender. Normal bowel sounds Genitourinary: Pilar Plate blood is noted coming from his Foley catheter Musculoskeletal: Nontender with normal range of motion in extremities. No lower extremity tenderness nor edema. Neurologic:  Normal speech and language. No gross focal neurologic deficits are appreciated.  Skin:  Skin is warm, dry and intact. No rash noted. Psychiatric: Mood and affect are normal. Speech and behavior are normal.  ____________________________________________  ED COURSE:  As part of my medical decision making, I reviewed the following data within the [place] History obtained from family if available, nursing notes, old chart and ekg, as well as notes from prior ED visits. Patient presented for hematuria, we will assess with labs and imaging as indicated at this time.   Procedures  Theodore Padilla was evaluated in Emergency Department on 10/17/2018 for the symptoms described in the history of present illness. He was evaluated in the context of the global COVID-19 pandemic, which necessitated consideration that the patient might be at risk for infection with the SARS-CoV-2 virus that causes COVID-19. Institutional protocols and algorithms that pertain to the evaluation of patients at risk for COVID-19 are in a  state of rapid change based on information released by regulatory bodies including the CDC and federal and state organizations. These policies and algorithms were followed during the patient's care in the ED.  ____________________________________________   LABS (pertinent positives/negatives)  Labs Reviewed  CBC WITH DIFFERENTIAL/PLATELET - Abnormal; Notable for the following components:       Result Value   RBC 3.15 (*)    Hemoglobin 9.9 (*)    HCT 30.2 (*)    All other components within normal limits  COMPREHENSIVE METABOLIC PANEL - Abnormal; Notable for the following components:   Sodium 132 (*)    Glucose, Bld 129 (*)    Calcium 8.6 (*)    Albumin 2.9 (*)    All other components within normal limits  CBC WITH DIFFERENTIAL/PLATELET - Abnormal; Notable for the following components:   RBC 2.98 (*)    Hemoglobin 9.3 (*)    HCT 28.8 (*)    All other components within normal limits  PROTIME-INR  APTT  TROPONIN I  URINALYSIS, COMPLETE (UACMP) WITH MICROSCOPIC  TYPE AND SCREEN  PREPARE RBC (CROSSMATCH)  ABO/RH   ____________________________________________   DIFFERENTIAL DIAGNOSIS   Anemia, prostate cancer, coagulopathy  FINAL ASSESSMENT AND PLAN  Hematuria, anemia   Plan: The patient had presented for frank hematuria. Patient's labs did reveal anemia, I repeated it throughout his stay and this did not result in a significant drop in his hemoglobin.  I did discuss with his urologist.  Unfortunately he does not have a lot of good options as he appears to have terminal cancer and is on hospice.  Patient prefers to go home.  I will refer him back to his urologist for follow-up.   Laurence Aly, MD    Note: This note was generated in part or whole with voice recognition software. Voice recognition is usually quite accurate but there are transcription errors that can and very often do occur. I apologize for any typographical errors that were not detected and corrected.     Earleen Newport, MD 10/17/18 1430

## 2018-10-17 NOTE — ED Notes (Signed)
53fr Foley removed without difficulty and new 73fr 3-way foley placed for CBI with Anda Kraft, Therapist, sports assisting.

## 2018-10-17 NOTE — ED Notes (Signed)
Patient's discharge and follow up information reviewed with patient by ED nursing staff and patient given the opportunity to ask questions pertaining to ED visit and discharge plan of care. Patient advised that should symptoms not continue to improve, resolve entirely, or should new symptoms develop then a follow up visit with their PCP or a return visit to the ED may be warranted. Patient verbalized consent and understanding of discharge plan of care including potential need for further evaluation. Patient discharged in stable condition per attending ED physician on duty.   Pt discharged to [place], traveling via [place]. All belongings placed in belongings bag and sent with transport crew.

## 2018-10-18 ENCOUNTER — Encounter: Payer: Self-pay | Admitting: Hospice and Palliative Medicine

## 2018-10-18 LAB — TYPE AND SCREEN
ABO/RH(D): A POS
Antibody Screen: NEGATIVE
Unit division: 0

## 2018-10-18 LAB — PREPARE RBC (CROSSMATCH)

## 2018-10-18 LAB — BPAM RBC
Blood Product Expiration Date: 202006182359
ISSUE DATE / TIME: 202006041531
Unit Type and Rh: 6200

## 2018-10-29 ENCOUNTER — Telehealth: Payer: Self-pay | Admitting: Hospice and Palliative Medicine

## 2018-10-29 NOTE — Telephone Encounter (Signed)
I spoke with patient's hospice nurse, Mitzie [phone]). Patient had some difficulty swallowing foods and was having severe indigestion. There was question about need for workup. However, nurse determined that patient was constipated. Symptoms resolved after adjustment of his bowel regimen and several large BMs.

## 2018-11-05 ENCOUNTER — Inpatient Hospital Stay
Admission: EM | Admit: 2018-11-05 | Discharge: 2018-11-08 | DRG: 871 | Disposition: A | Discharge status: Hospice | Attending: Internal Medicine | Admitting: Internal Medicine

## 2018-11-05 ENCOUNTER — Other Ambulatory Visit: Payer: Self-pay

## 2018-11-05 DIAGNOSIS — Z66 Do not resuscitate: Secondary | ICD-10-CM | POA: Diagnosis present

## 2018-11-05 DIAGNOSIS — R778 Other specified abnormalities of plasma proteins: Secondary | ICD-10-CM

## 2018-11-05 DIAGNOSIS — E785 Hyperlipidemia, unspecified: Secondary | ICD-10-CM | POA: Diagnosis present

## 2018-11-05 DIAGNOSIS — Z87442 Personal history of urinary calculi: Secondary | ICD-10-CM

## 2018-11-05 DIAGNOSIS — I081 Rheumatic disorders of both mitral and tricuspid valves: Secondary | ICD-10-CM | POA: Diagnosis present

## 2018-11-05 DIAGNOSIS — E871 Hypo-osmolality and hyponatremia: Secondary | ICD-10-CM | POA: Diagnosis present

## 2018-11-05 DIAGNOSIS — T83511A Infection and inflammatory reaction due to indwelling urethral catheter, initial encounter: Secondary | ICD-10-CM | POA: Diagnosis present

## 2018-11-05 DIAGNOSIS — K269 Duodenal ulcer, unspecified as acute or chronic, without hemorrhage or perforation: Secondary | ICD-10-CM

## 2018-11-05 DIAGNOSIS — Z955 Presence of coronary angioplasty implant and graft: Secondary | ICD-10-CM

## 2018-11-05 DIAGNOSIS — Z880 Allergy status to penicillin: Secondary | ICD-10-CM

## 2018-11-05 DIAGNOSIS — J189 Pneumonia, unspecified organism: Secondary | ICD-10-CM | POA: Diagnosis present

## 2018-11-05 DIAGNOSIS — I10 Essential (primary) hypertension: Secondary | ICD-10-CM | POA: Diagnosis present

## 2018-11-05 DIAGNOSIS — D638 Anemia in other chronic diseases classified elsewhere: Secondary | ICD-10-CM | POA: Diagnosis present

## 2018-11-05 DIAGNOSIS — Z961 Presence of intraocular lens: Secondary | ICD-10-CM | POA: Diagnosis present

## 2018-11-05 DIAGNOSIS — C799 Secondary malignant neoplasm of unspecified site: Secondary | ICD-10-CM | POA: Diagnosis present

## 2018-11-05 DIAGNOSIS — Z9842 Cataract extraction status, left eye: Secondary | ICD-10-CM

## 2018-11-05 DIAGNOSIS — A4181 Sepsis due to Enterococcus: Secondary | ICD-10-CM | POA: Diagnosis not present

## 2018-11-05 DIAGNOSIS — E876 Hypokalemia: Secondary | ICD-10-CM | POA: Diagnosis present

## 2018-11-05 DIAGNOSIS — G4730 Sleep apnea, unspecified: Secondary | ICD-10-CM | POA: Diagnosis present

## 2018-11-05 DIAGNOSIS — C61 Malignant neoplasm of prostate: Secondary | ICD-10-CM | POA: Diagnosis present

## 2018-11-05 DIAGNOSIS — Z96643 Presence of artificial hip joint, bilateral: Secondary | ICD-10-CM | POA: Diagnosis present

## 2018-11-05 DIAGNOSIS — Y846 Urinary catheterization as the cause of abnormal reaction of the patient, or of later complication, without mention of misadventure at the time of the procedure: Secondary | ICD-10-CM | POA: Diagnosis present

## 2018-11-05 DIAGNOSIS — Z79891 Long term (current) use of opiate analgesic: Secondary | ICD-10-CM

## 2018-11-05 DIAGNOSIS — E860 Dehydration: Secondary | ICD-10-CM

## 2018-11-05 DIAGNOSIS — I959 Hypotension, unspecified: Secondary | ICD-10-CM | POA: Diagnosis present

## 2018-11-05 DIAGNOSIS — Z888 Allergy status to other drugs, medicaments and biological substances status: Secondary | ICD-10-CM

## 2018-11-05 DIAGNOSIS — N390 Urinary tract infection, site not specified: Secondary | ICD-10-CM

## 2018-11-05 DIAGNOSIS — N179 Acute kidney failure, unspecified: Secondary | ICD-10-CM

## 2018-11-05 DIAGNOSIS — Z79899 Other long term (current) drug therapy: Secondary | ICD-10-CM

## 2018-11-05 DIAGNOSIS — K5909 Other constipation: Secondary | ICD-10-CM | POA: Diagnosis present

## 2018-11-05 DIAGNOSIS — R55 Syncope and collapse: Secondary | ICD-10-CM

## 2018-11-05 DIAGNOSIS — A419 Sepsis, unspecified organism: Secondary | ICD-10-CM | POA: Diagnosis present

## 2018-11-05 DIAGNOSIS — N400 Enlarged prostate without lower urinary tract symptoms: Secondary | ICD-10-CM | POA: Diagnosis present

## 2018-11-05 DIAGNOSIS — M109 Gout, unspecified: Secondary | ICD-10-CM | POA: Diagnosis present

## 2018-11-05 DIAGNOSIS — E878 Other disorders of electrolyte and fluid balance, not elsewhere classified: Secondary | ICD-10-CM | POA: Diagnosis present

## 2018-11-05 DIAGNOSIS — R7989 Other specified abnormal findings of blood chemistry: Secondary | ICD-10-CM

## 2018-11-05 DIAGNOSIS — I2510 Atherosclerotic heart disease of native coronary artery without angina pectoris: Secondary | ICD-10-CM | POA: Diagnosis present

## 2018-11-05 DIAGNOSIS — M810 Age-related osteoporosis without current pathological fracture: Secondary | ICD-10-CM | POA: Diagnosis present

## 2018-11-05 DIAGNOSIS — M1990 Unspecified osteoarthritis, unspecified site: Secondary | ICD-10-CM | POA: Diagnosis present

## 2018-11-05 DIAGNOSIS — Z1159 Encounter for screening for other viral diseases: Secondary | ICD-10-CM

## 2018-11-05 DIAGNOSIS — K5900 Constipation, unspecified: Secondary | ICD-10-CM

## 2018-11-05 MED ORDER — SODIUM CHLORIDE 0.9 % IV BOLUS
1000.0000 mL | Freq: Once | INTRAVENOUS | Status: AC
  Administered 2018-11-05: 1000 mL via INTRAVENOUS

## 2018-11-05 NOTE — ED Triage Notes (Signed)
Patient presents to ED via [facility]. Patient reported that he "was getting off the commode to sit on the wheelchair and he fell foreword." Patient fell at approximately at 7 pm.  Patient reports abdominal pain. And a Hx of colon cancer. Patient received a 500 ml Bolus of fluid due to Hypotension.

## 2018-11-05 NOTE — ED Notes (Signed)
ED Provider at bedside. 

## 2018-11-05 NOTE — ED Notes (Signed)
Patient AAOX4. Presented to the ED after a fall. Patient reported this happened around 7pm today.  Patient  Reported abdominal pain at a 8/10 . Patient came in  Hypotensive. Currently his blood pressure is 106/57 after fluids. Patient placed on bed pain , readjusted, and Peri-care provided.

## 2018-11-06 ENCOUNTER — Other Ambulatory Visit: Payer: Self-pay

## 2018-11-06 ENCOUNTER — Emergency Department

## 2018-11-06 ENCOUNTER — Encounter: Payer: Self-pay | Admitting: Internal Medicine

## 2018-11-06 DIAGNOSIS — N179 Acute kidney failure, unspecified: Secondary | ICD-10-CM | POA: Diagnosis present

## 2018-11-06 DIAGNOSIS — I081 Rheumatic disorders of both mitral and tricuspid valves: Secondary | ICD-10-CM | POA: Diagnosis present

## 2018-11-06 DIAGNOSIS — M1990 Unspecified osteoarthritis, unspecified site: Secondary | ICD-10-CM | POA: Diagnosis present

## 2018-11-06 DIAGNOSIS — Z96643 Presence of artificial hip joint, bilateral: Secondary | ICD-10-CM | POA: Diagnosis present

## 2018-11-06 DIAGNOSIS — I10 Essential (primary) hypertension: Secondary | ICD-10-CM | POA: Diagnosis present

## 2018-11-06 DIAGNOSIS — C61 Malignant neoplasm of prostate: Secondary | ICD-10-CM | POA: Diagnosis present

## 2018-11-06 DIAGNOSIS — C799 Secondary malignant neoplasm of unspecified site: Secondary | ICD-10-CM | POA: Diagnosis present

## 2018-11-06 DIAGNOSIS — N390 Urinary tract infection, site not specified: Secondary | ICD-10-CM | POA: Diagnosis present

## 2018-11-06 DIAGNOSIS — I2510 Atherosclerotic heart disease of native coronary artery without angina pectoris: Secondary | ICD-10-CM | POA: Diagnosis present

## 2018-11-06 DIAGNOSIS — M109 Gout, unspecified: Secondary | ICD-10-CM | POA: Diagnosis present

## 2018-11-06 DIAGNOSIS — E785 Hyperlipidemia, unspecified: Secondary | ICD-10-CM | POA: Diagnosis present

## 2018-11-06 DIAGNOSIS — A4181 Sepsis due to Enterococcus: Secondary | ICD-10-CM | POA: Diagnosis present

## 2018-11-06 DIAGNOSIS — E860 Dehydration: Secondary | ICD-10-CM | POA: Diagnosis present

## 2018-11-06 DIAGNOSIS — Z66 Do not resuscitate: Secondary | ICD-10-CM | POA: Diagnosis present

## 2018-11-06 DIAGNOSIS — Z961 Presence of intraocular lens: Secondary | ICD-10-CM | POA: Diagnosis present

## 2018-11-06 DIAGNOSIS — J189 Pneumonia, unspecified organism: Secondary | ICD-10-CM | POA: Diagnosis present

## 2018-11-06 DIAGNOSIS — K269 Duodenal ulcer, unspecified as acute or chronic, without hemorrhage or perforation: Secondary | ICD-10-CM | POA: Diagnosis present

## 2018-11-06 DIAGNOSIS — A419 Sepsis, unspecified organism: Secondary | ICD-10-CM | POA: Diagnosis present

## 2018-11-06 DIAGNOSIS — D638 Anemia in other chronic diseases classified elsewhere: Secondary | ICD-10-CM | POA: Diagnosis present

## 2018-11-06 DIAGNOSIS — E878 Other disorders of electrolyte and fluid balance, not elsewhere classified: Secondary | ICD-10-CM | POA: Diagnosis present

## 2018-11-06 DIAGNOSIS — Z1159 Encounter for screening for other viral diseases: Secondary | ICD-10-CM | POA: Diagnosis not present

## 2018-11-06 DIAGNOSIS — E871 Hypo-osmolality and hyponatremia: Secondary | ICD-10-CM | POA: Diagnosis present

## 2018-11-06 DIAGNOSIS — N400 Enlarged prostate without lower urinary tract symptoms: Secondary | ICD-10-CM | POA: Diagnosis present

## 2018-11-06 DIAGNOSIS — T83511A Infection and inflammatory reaction due to indwelling urethral catheter, initial encounter: Secondary | ICD-10-CM | POA: Diagnosis present

## 2018-11-06 DIAGNOSIS — Y846 Urinary catheterization as the cause of abnormal reaction of the patient, or of later complication, without mention of misadventure at the time of the procedure: Secondary | ICD-10-CM | POA: Diagnosis present

## 2018-11-06 DIAGNOSIS — K5909 Other constipation: Secondary | ICD-10-CM | POA: Diagnosis present

## 2018-11-06 LAB — BASIC METABOLIC PANEL
Anion gap: 10 (ref 5–15)
BUN: 35 mg/dL — ABNORMAL HIGH (ref 8–23)
CO2: 24 mmol/L (ref 22–32)
Calcium: 8 mg/dL — ABNORMAL LOW (ref 8.9–10.3)
Chloride: 99 mmol/L (ref 98–111)
Creatinine, Ser: 1.6 mg/dL — ABNORMAL HIGH (ref 0.61–1.24)
GFR calc Af Amer: 42 mL/min — ABNORMAL LOW (ref >60)
GFR calc non Af Amer: 36 mL/min — ABNORMAL LOW (ref >60)
Glucose, Bld: 109 mg/dL — ABNORMAL HIGH (ref 70–99)
Potassium: 3.7 mmol/L (ref 3.5–5.1)
Sodium: 133 mmol/L — ABNORMAL LOW (ref 135–145)

## 2018-11-06 LAB — CBC WITH DIFFERENTIAL/PLATELET
Abs Immature Granulocytes: 0.08 10*3/uL — ABNORMAL HIGH (ref 0.00–0.07)
Basophils Absolute: 0 10*3/uL (ref 0.0–0.1)
Basophils Relative: 0 %
Eosinophils Absolute: 0 10*3/uL (ref 0.0–0.5)
Eosinophils Relative: 0 %
HCT: 31 % — ABNORMAL LOW (ref 39.0–52.0)
Hemoglobin: 10 g/dL — ABNORMAL LOW (ref 13.0–17.0)
Immature Granulocytes: 1 %
Lymphocytes Relative: 5 %
Lymphs Abs: 0.6 10*3/uL — ABNORMAL LOW (ref 0.7–4.0)
MCH: 31.3 pg (ref 26.0–34.0)
MCHC: 32.3 g/dL (ref 30.0–36.0)
MCV: 97.2 fL (ref 80.0–100.0)
Monocytes Absolute: 0.6 10*3/uL (ref 0.1–1.0)
Monocytes Relative: 5 %
Neutro Abs: 9.8 10*3/uL — ABNORMAL HIGH (ref 1.7–7.7)
Neutrophils Relative %: 89 %
Platelets: 271 10*3/uL (ref 150–400)
RBC: 3.19 MIL/uL — ABNORMAL LOW (ref 4.22–5.81)
RDW: 14.2 % (ref 11.5–15.5)
WBC: 11 10*3/uL — ABNORMAL HIGH (ref 4.0–10.5)
nRBC: 0 % (ref 0.0–0.2)

## 2018-11-06 LAB — CBC
HCT: 29.7 % — ABNORMAL LOW (ref 39.0–52.0)
Hemoglobin: 9.5 g/dL — ABNORMAL LOW (ref 13.0–17.0)
MCH: 31.1 pg (ref 26.0–34.0)
MCHC: 32 g/dL (ref 30.0–36.0)
MCV: 97.4 fL (ref 80.0–100.0)
Platelets: 210 10*3/uL (ref 150–400)
RBC: 3.05 MIL/uL — ABNORMAL LOW (ref 4.22–5.81)
RDW: 14.5 % (ref 11.5–15.5)
WBC: 10.3 10*3/uL (ref 4.0–10.5)
nRBC: 0 % (ref 0.0–0.2)

## 2018-11-06 LAB — COMPREHENSIVE METABOLIC PANEL
ALT: 17 U/L (ref 0–44)
AST: 30 U/L (ref 15–41)
Albumin: 2.7 g/dL — ABNORMAL LOW (ref 3.5–5.0)
Alkaline Phosphatase: 56 U/L (ref 38–126)
Anion gap: 12 (ref 5–15)
BUN: 33 mg/dL — ABNORMAL HIGH (ref 8–23)
CO2: 20 mmol/L — ABNORMAL LOW (ref 22–32)
Calcium: 8.6 mg/dL — ABNORMAL LOW (ref 8.9–10.3)
Chloride: 101 mmol/L (ref 98–111)
Creatinine, Ser: 1.85 mg/dL — ABNORMAL HIGH (ref 0.61–1.24)
GFR calc Af Amer: 35 mL/min — ABNORMAL LOW (ref >60)
GFR calc non Af Amer: 30 mL/min — ABNORMAL LOW (ref >60)
Glucose, Bld: 163 mg/dL — ABNORMAL HIGH (ref 70–99)
Potassium: 3.4 mmol/L — ABNORMAL LOW (ref 3.5–5.1)
Sodium: 133 mmol/L — ABNORMAL LOW (ref 135–145)
Total Bilirubin: 0.4 mg/dL (ref 0.3–1.2)
Total Protein: 6.9 g/dL (ref 6.5–8.1)

## 2018-11-06 LAB — URINALYSIS, COMPLETE (UACMP) WITH MICROSCOPIC
Bacteria, UA: NONE SEEN
Bilirubin Urine: NEGATIVE
Glucose, UA: NEGATIVE mg/dL
Ketones, ur: NEGATIVE mg/dL
Nitrite: POSITIVE — AB
Protein, ur: 100 mg/dL — AB
RBC / HPF: >50 RBC/hpf — ABNORMAL HIGH (ref 0–5)
Specific Gravity, Urine: 1.019 (ref 1.005–1.030)
Squamous Epithelial / HPF: NONE SEEN (ref 0–5)
WBC, UA: >50 WBC/hpf — ABNORMAL HIGH (ref 0–5)
pH: 5 (ref 5.0–8.0)

## 2018-11-06 LAB — LACTIC ACID, PLASMA
Lactic Acid, Venous: 1.3 mmol/L (ref 0.5–1.9)
Lactic Acid, Venous: 1.4 mmol/L (ref 0.5–1.9)

## 2018-11-06 LAB — BRAIN NATRIURETIC PEPTIDE: B Natriuretic Peptide: 124 pg/mL — ABNORMAL HIGH (ref 0.0–100.0)

## 2018-11-06 LAB — LIPASE, BLOOD: Lipase: 33 U/L (ref 11–51)

## 2018-11-06 LAB — SARS CORONAVIRUS 2 BY RT PCR (HOSPITAL ORDER, PERFORMED IN ~~LOC~~ HOSPITAL LAB): SARS Coronavirus 2: NEGATIVE

## 2018-11-06 LAB — PROTIME-INR
INR: 1.2 (ref 0.8–1.2)
Prothrombin Time: 15.1 seconds (ref 11.4–15.2)

## 2018-11-06 LAB — MAGNESIUM: Magnesium: 2.3 mg/dL (ref 1.7–2.4)

## 2018-11-06 LAB — PROCALCITONIN: Procalcitonin: 0.19 ng/mL

## 2018-11-06 LAB — TROPONIN I (HIGH SENSITIVITY): Troponin I (High Sensitivity): 33 ng/L — ABNORMAL HIGH (ref <18)

## 2018-11-06 MED ORDER — ACETAMINOPHEN 650 MG RE SUPP: 650.0000 mg | Freq: Four times a day (QID) | RECTAL | Status: DC | PRN

## 2018-11-06 MED ORDER — ENOXAPARIN SODIUM 30 MG/0.3ML ~~LOC~~ SOLN
30.0000 mg | SUBCUTANEOUS | Status: DC
  Administered 2018-11-06: 23:00:00 30 mg via SUBCUTANEOUS
  Filled 2018-11-06 (×2): qty 0.3

## 2018-11-06 MED ORDER — ACETAMINOPHEN 325 MG PO TABS
650.0000 mg | ORAL_TABLET | Freq: Four times a day (QID) | ORAL | Status: DC | PRN
  Administered 2018-11-07: 650 mg via ORAL
  Filled 2018-11-06: qty 2

## 2018-11-06 MED ORDER — OXYBUTYNIN CHLORIDE 5 MG PO TABS
5.0000 mg | ORAL_TABLET | Freq: Three times a day (TID) | ORAL | Status: DC | PRN
  Filled 2018-11-06: qty 1

## 2018-11-06 MED ORDER — LACTULOSE 10 GM/15ML PO SOLN: 20.0000 g | Freq: Two times a day (BID) | ORAL | Status: DC | PRN

## 2018-11-06 MED ORDER — MAGNESIUM HYDROXIDE 800 MG/5ML PO SUSP: 5.0000 mL | Freq: Every day | ORAL | Status: DC | PRN

## 2018-11-06 MED ORDER — MAGNESIUM HYDROXIDE 400 MG/5ML PO SUSP
30.0000 mL | Freq: Every day | ORAL | Status: DC | PRN
  Filled 2018-11-06: qty 30

## 2018-11-06 MED ORDER — DIPHENHYDRAMINE HCL 25 MG PO CAPS: 25.0000 mg | ORAL_CAPSULE | Freq: Every evening | ORAL | Status: DC | PRN

## 2018-11-06 MED ORDER — TART CHERRY ADVANCED PO CAPS: ORAL_CAPSULE | Freq: Every day | ORAL | Status: DC | PRN

## 2018-11-06 MED ORDER — DEXTROSE 5 % IV SOLN
0.5000 g | Freq: Two times a day (BID) | INTRAVENOUS | Status: DC
  Administered 2018-11-06 (×2): 0.5 g via INTRAVENOUS
  Filled 2018-11-06 (×4): qty 0.5

## 2018-11-06 MED ORDER — TRAZODONE HCL 50 MG PO TABS: 25.0000 mg | ORAL_TABLET | Freq: Every evening | ORAL | Status: DC | PRN

## 2018-11-06 MED ORDER — ADULT MULTIVITAMIN W/MINERALS CH
1.0000 | ORAL_TABLET | Freq: Every day | ORAL | Status: DC
  Administered 2018-11-06 – 2018-11-08 (×3): 1 via ORAL
  Filled 2018-11-06 (×3): qty 1

## 2018-11-06 MED ORDER — POLYETHYLENE GLYCOL 3350 17 G PO PACK
17.0000 g | PACK | Freq: Every day | ORAL | Status: DC
  Administered 2018-11-06 – 2018-11-08 (×2): 17 g via ORAL
  Filled 2018-11-06 (×3): qty 1

## 2018-11-06 MED ORDER — SODIUM CHLORIDE 0.9 % IV BOLUS
500.0000 mL | Freq: Once | INTRAVENOUS | Status: AC
  Administered 2018-11-06: 500 mL via INTRAVENOUS

## 2018-11-06 MED ORDER — POTASSIUM CHLORIDE 20 MEQ PO PACK
40.0000 meq | PACK | Freq: Once | ORAL | Status: AC
  Administered 2018-11-06: 40 meq via ORAL
  Filled 2018-11-06: qty 2

## 2018-11-06 MED ORDER — SODIUM CHLORIDE 0.9 % IV SOLN
2.0000 g | Freq: Once | INTRAVENOUS | Status: AC
  Administered 2018-11-06: 02:00:00 2 g via INTRAVENOUS
  Filled 2018-11-06: qty 2

## 2018-11-06 MED ORDER — OXYCODONE-ACETAMINOPHEN 5-325 MG PO TABS
1.0000 | ORAL_TABLET | ORAL | Status: DC | PRN
  Administered 2018-11-08: 11:00:00 2 via ORAL
  Filled 2018-11-06: qty 2

## 2018-11-06 MED ORDER — MORPHINE SULFATE 10 MG/5ML PO SOLN: 2.0000 mg | ORAL | Status: DC | PRN

## 2018-11-06 MED ORDER — MORPHINE SULFATE (CONCENTRATE) 10 MG/0.5ML PO SOLN
5.0000 mg | ORAL | Status: DC | PRN
  Filled 2018-11-06: qty 0.5

## 2018-11-06 MED ORDER — POTASSIUM CHLORIDE IN NACL 20-0.9 MEQ/L-% IV SOLN
INTRAVENOUS | Status: DC
  Administered 2018-11-06 – 2018-11-07 (×4): via INTRAVENOUS
  Filled 2018-11-06 (×6): qty 1000

## 2018-11-06 MED ORDER — MORPHINE SULFATE (PF) 2 MG/ML IV SOLN
INTRAVENOUS | Status: AC
  Administered 2018-11-06: 05:00:00 2 mg via INTRAVENOUS
  Filled 2018-11-06: qty 1

## 2018-11-06 MED ORDER — ACETAMINOPHEN-CODEINE #3 300-30 MG PO TABS: 1.0000 | ORAL_TABLET | ORAL | Status: DC | PRN

## 2018-11-06 MED ORDER — MORPHINE SULFATE (PF) 2 MG/ML IV SOLN
2.0000 mg | INTRAVENOUS | Status: DC | PRN
  Administered 2018-11-06 – 2018-11-08 (×2): 2 mg via INTRAVENOUS
  Filled 2018-11-06: qty 1

## 2018-11-06 MED ORDER — ONDANSETRON HCL 4 MG/2ML IJ SOLN: 4.0000 mg | Freq: Four times a day (QID) | INTRAMUSCULAR | Status: DC | PRN

## 2018-11-06 MED ORDER — SODIUM CHLORIDE 0.9 % IV SOLN
INTRAVENOUS | Status: DC | PRN
  Administered 2018-11-06: 23:00:00 via INTRAVENOUS

## 2018-11-06 MED ORDER — ONDANSETRON HCL 4 MG PO TABS: 4.0000 mg | ORAL_TABLET | Freq: Four times a day (QID) | ORAL | Status: DC | PRN

## 2018-11-06 MED ORDER — BISACODYL 5 MG PO TBEC: 5.0000 mg | DELAYED_RELEASE_TABLET | Freq: Two times a day (BID) | ORAL | Status: DC | PRN

## 2018-11-06 NOTE — Sepsis Progress Note (Signed)
Notified provider of need to order fluid bolus, MD responded that  Fluid not indicated for now since LA is normal and don't want to get on fluid overload.Marland Kitchen

## 2018-11-06 NOTE — Progress Notes (Signed)
PT Cancellation Note  Patient Details Name: Theodore Padilla MRN: [id number] DOB: 05/21/1921   Cancelled Treatment:    Reason Eval/Treat Not Completed: Other (comment). Consult received and chart reviewed. Pt currently on hospice care, confirmed with hospice liaison. No PT order necessary and will complete at this time.   Marlo Arriola 11/06/2018, 3:02 PM  Greggory Stallion, PT, DPT [phone]

## 2018-11-06 NOTE — Progress Notes (Signed)
Patient admitted from ER +UTI status post fall at home.  Patient alert and oriented x4.  Patient reports he lives at home alone, with an aide that spends time daily with him and "my hospice nurse checks in on me a lot." Patient requests to transfer to chair. Max assist x2, stand and pivot from stretcher.  Patient oriented to hospital, room, and use of call light.  Patient denies SOB or chest pain; he does support chronic back pain, but declines offer for any medication at present. VSS.    11/06/18 1145  Vitals  Temp 98.6 F (37 C)  Temp Source Oral  BP 139/60  MAP (mmHg) 84  BP Location Right Arm  BP Method Automatic  Patient Position (if appropriate) Sitting  Pulse Rate 62  Pulse Rate Source Monitor  Resp 20  Oxygen Therapy  SpO2 96 %  O2 Device Room Air  Pain Assessment  Pain Scale 0-10  Pain Score 5  Pain Type Chronic pain  Pain Location Back  Pain Descriptors / Indicators Constant;Discomfort  Pain Frequency Constant  MEWS Score  MEWS RR 0  MEWS Pulse 0  MEWS Systolic 0  MEWS LOC 0  MEWS Temp 0  MEWS Score 0  MEWS Score Color Green

## 2018-11-06 NOTE — ED Provider Notes (Signed)
[facility] Emergency Department Provider Note  ____________________________________________   First MD Initiated Contact with Patient 11/05/18 2310     (approximate)  I have reviewed the triage vital signs and the nursing notes.   HISTORY  Chief Complaint Fall    HPI Theodore Padilla is a 85 y.o. male with medical history as listed below which notably includes what is apparently metastatic prostate cancer under the care of Dr. Rogue Bussing.    The patient comes by EMS with the [facility] DNR paperwork and he confirmed verbally to me that he does wish to be DNR/DNI.  He presents tonight for evaluation of a syncopal or near syncopal episode.  He reports that over the last few days he has been feeling increasingly weak.  He lives alone with in-home hospice but he has not been able to have a bowel movement today which is not unusual for him.  He also reports decreased output from his urinary catheter.  He says he is not able to eat or drink anything except for a little bit of soup.  He has constant pain in his rectum which is no worse than usual.  However his weakness has been getting gradually worse and is now severe.  He was on the commode when he passed out or almost passed out.  He says that sometimes he feels a little bit of chest pain and occasionally feels shortness of breath.  He denies sore throat, cough, nausea, and vomiting.  Overall he describes his symptoms as severe.  He has been in the emergency department twice over the last couple of months and each time he is wanted to go home but he said that he feels poorly enough this time that he thinks he may need to be hospitalized.  Of note his blood pressure was low per EMS and I provided a 500 mL normal saline bolus.        Past Medical History:  Diagnosis Date   Arthritis    B12 deficiency    BPH (benign prostatic hyperplasia)    Chronic constipation    Coronary artery disease    with stenting x 1    Degeneration of spine    Gout    Hearing aid worn    bilateral   History of heart artery stent 2002   History of kidney stones    HLD (hyperlipidemia)    Hypertension    Kidney stones    Lower extremity edema 2019   Moderate mitral insufficiency    Moderate tricuspid insufficiency    Osteoporosis    Prostate cancer (HCC)    Rectal tumor    Shortness of breath    occasional   Sleep apnea    no CPAP, pt denies OSA   Spine deformity 2019   has severe back pain. previously had surgery without success for improvement   Urinary hesitancy    Urinary retention    Wears dentures    partial upper and lower    Patient Active Problem List   Diagnosis Date Noted   Goals of care, counseling/discussion 02/04/2018   Prostate cancer ([place]) 02/01/2018   GI bleed 07/10/2016   B12 deficiency 02/23/2016   Microscopic hematuria 03/10/2015   Left renal mass 03/10/2015   BPH with obstruction/lower urinary tract symptoms 03/10/2015   Moderate mitral insufficiency 09/30/2014   Moderate tricuspid insufficiency 09/30/2014   Benign essential hypertension 09/25/2014   Hyperlipidemia, mixed 09/25/2014   Chronic constipation 03/12/2014   Chest pain 10/01/2013  Shortness of breath 10/01/2013   Adiposity 09/24/2013   CAD (coronary artery disease), native coronary artery 09/24/2013   Kidney stone 09/24/2013   Coronary arteriosclerosis in native artery 09/24/2013   DJD (degenerative joint disease), lumbar 09/24/2013   Hyperlipidemia 09/24/2013   Hypertension 09/24/2013   Nephrolithiasis 09/24/2013   Obesity 09/24/2013   Osteoarthritis of lumbar spine 09/24/2013    Past Surgical History:  Procedure Laterality Date   [place]  2000   with stent   CATARACT EXTRACTION W/PHACO Right 04/18/2017   Procedure: CATARACT EXTRACTION PHACO AND INTRAOCULAR LENS PLACEMENT ([place]) RIGHT;  Surgeon: Leandrew Koyanagi, MD;  Location: [place];  Service: Ophthalmology;  Laterality: Right;   CATARACT EXTRACTION W/PHACO Left 05/23/2017   Procedure: CATARACT EXTRACTION PHACO AND INTRAOCULAR LENS PLACEMENT ([place]) LEFT;  Surgeon: Leandrew Koyanagi, MD;  Location: [place];  Service: Ophthalmology;  Laterality: Left;   ESOPHAGOGASTRODUODENOSCOPY (EGD) WITH PROPOFOL N/A 07/12/2016   Procedure: ESOPHAGOGASTRODUODENOSCOPY (EGD) WITH PROPOFOL;  Surgeon: Jonathon Bellows, MD;  Location: [facility];  Service: Endoscopy;  Laterality: N/A;   FOREIGN BODY REMOVAL     L knee   HERNIA REPAIR     umbilicus (below)    JOINT REPLACEMENT Bilateral    total hip arthroplasty   KIDNEY STONE SURGERY     LITHOTRIPSY     LUMBAR LAMINECTOMY/DECOMPRESSION MICRODISCECTOMY  01/22/2012   Procedure: LUMBAR LAMINECTOMY/DECOMPRESSION MICRODISCECTOMY 1 LEVEL;  Surgeon: Ophelia Charter, MD;  Location: [place] [facility];  Service: Neurosurgery;  Laterality: N/A;  Lumbar two and lumbar three laminectomies   ORCHIECTOMY Bilateral 10/16/2017   Procedure: ORCHIECTOMY;  Surgeon: Royston Cowper, MD;  Location: [facility];  Service: Urology;  Laterality: Bilateral;   TONSILLECTOMY     TOTAL HIP ARTHROPLASTY     bilat    Prior to Admission medications   Medication Sig Start Date End Date Taking? Authorizing Provider  acetaminophen-codeine (TYLENOL #3) 300-30 MG tablet Take 1 tablet by mouth every 4 (four) hours as needed for moderate pain. 07/10/18   Cammie Sickle, MD  [facility] ALLERGY 25 MG capsule Take 1 capsule by mouth at bedtime as needed. 10/25/18   [provider]  [facility] BISACODYL 5 MG EC tablet TAKE 2-4 TABLETS BY MOUTH DAILY 08/15/18   Cammie Sickle, MD  dexamethasone (DECADRON) 2 MG tablet Take 2 mg by mouth daily. 10/23/18   [provider]  dexamethasone (DECADRON) 4 MG tablet Take 0.5 tablets (2 mg total) by mouth daily. Patient not taking: Reported on 11/06/2018 07/09/18   Borders,  Kirt Boys, NP  hydrochlorothiazide (HYDRODIURIL) 12.5 MG tablet Take 12.5 mg by mouth daily. 02/11/18   [provider]  lactulose (CHRONULAC) 10 GM/15ML solution Take 30 mLs (20 g total) by mouth 2 (two) times daily as needed for mild constipation. 07/09/18   Borders, Kirt Boys, NP  magnesium hydroxide (MILK OF MAGNESIA) 800 MG/5ML suspension Take 5 mLs by mouth daily as needed for constipation.    [provider]  Misc Natural Products (TART CHERRY ADVANCED PO) Take 1 capsule by mouth daily as needed (gout pain).    [provider]  Morphine Sulfate (MORPHINE CONCENTRATE) 10 mg / 0.5 ml concentrated solution Take 0.25-0.5 mLs (5-10 mg total) by mouth every 4 (four) hours as needed for moderate pain, severe pain or shortness of breath. 07/11/18   Borders, Kirt Boys, NP  Multiple Vitamin (MULTIVITAMIN WITH MINERALS) TABS tablet Take  1 tablet by mouth daily.    [provider]  oxybutynin (DITROPAN) 5 MG tablet Take 1 tablet by mouth every 8 (eight) hours as needed. 10/25/18   [provider]  sulfamethoxazole-trimethoprim (BACTRIM DS) 800-160 MG tablet Take 1 tablet by mouth 2 (two) times daily. Patient not taking: Reported on 11/06/2018 09/27/18   Borders, Kirt Boys, NP    Allergies Ciprofloxacin, Penicillins, Penicillins, Allopurinol, Pantoprazole, Rofecoxib, and Tramadol  Family History  Problem Relation Age of Onset   Kidney Stones Other    Kidney disease Neg Hx    Prostate cancer Neg Hx     Social History Social History   Tobacco Use   Smoking status: Never Smoker   Smokeless tobacco: Never Used   Tobacco comment: quit 1960  Substance Use Topics   Alcohol use: Not Currently    Comment: very rarely   Drug use: No    Review of Systems Constitutional: Generalized weakness, fatigue and malaise Eyes: No visual changes. ENT: No sore throat. Cardiovascular: "Sometimes "chest pain. Respiratory: "Sometimes " shortness of  breath. Gastrointestinal: No abdominal pain, chronic rectal pain.  No nausea, no vomiting.  Frequent alternating episodes of diarrhea and constipation, currently feels constipated. Genitourinary: Decreased urinary output from Foley catheter. Musculoskeletal: Negative for neck pain.  Negative for back pain. Integumentary: Negative for rash. Neurological: Generalized weakness.  Negative for headaches, focal weakness or numbness.   ____________________________________________   PHYSICAL EXAM:  VITAL SIGNS: ED Triage Vitals  Enc Vitals Group     BP 11/05/18 2309 (!) 85/56     Pulse Rate 11/05/18 2309 99     Resp 11/05/18 2309 20     Temp 11/05/18 2309 98.4 F (36.9 C)     Temp Source 11/05/18 2309 Oral     SpO2 11/05/18 2309 96 %     Weight 11/05/18 2307 81.6 kg (180 lb)     Height 11/05/18 2307 1.676 m (5\' 6" )     Head Circumference --      Peak Flow --      Pain Score 11/05/18 2306 8     Pain Loc --      Pain Edu? --      Excl. in [place]? --     Constitutional: Alert and oriented.  Appears younger than chronological age in spite of his chronic medical issues.  Currently appears uncomfortable but is not in acute distress. Eyes: Conjunctivae are normal.  Head: Atraumatic. Nose: No congestion/rhinnorhea. Mouth/Throat: Mucous membranes are dry. Neck: No stridor.  No meningeal signs.   Cardiovascular: Somewhat irregular rhythm but normal rate. Good peripheral circulation. Grossly normal heart sounds. Respiratory: Normal respiratory effort.  No retractions.  Mild tachypnea.  No audible wheezing. Gastrointestinal: Soft and nontender. No distention.  Genitourinary: Chronic indwelling Foley catheter. Musculoskeletal: No lower extremity tenderness nor edema. No gross deformities of extremities. Neurologic:  Normal speech and language. No gross focal neurologic deficits are appreciated.  Skin:  Skin is warm, dry and intact. No rash noted. Psychiatric: Mood and affect are normal. Speech  and behavior are normal.  ____________________________________________   LABS (all labs ordered are listed, but only abnormal results are displayed)  Labs Reviewed  COMPREHENSIVE METABOLIC PANEL - Abnormal; Notable for the following components:      Result Value   Sodium 133 (*)    Potassium 3.4 (*)    CO2 20 (*)    Glucose, Bld 163 (*)    BUN 33 (*)    Creatinine, Ser 1.85 (*)  Calcium 8.6 (*)    Albumin 2.7 (*)    GFR calc non Af Amer 30 (*)    GFR calc Af Amer 35 (*)    All other components within normal limits  BRAIN NATRIURETIC PEPTIDE - Abnormal; Notable for the following components:   B Natriuretic Peptide 124.0 (*)    All other components within normal limits  CBC WITH DIFFERENTIAL/PLATELET - Abnormal; Notable for the following components:   WBC 11.0 (*)    RBC 3.19 (*)    Hemoglobin 10.0 (*)    HCT 31.0 (*)    Neutro Abs 9.8 (*)    Lymphs Abs 0.6 (*)    Abs Immature Granulocytes 0.08 (*)    All other components within normal limits  URINALYSIS, COMPLETE (UACMP) WITH MICROSCOPIC - Abnormal; Notable for the following components:   Color, Urine AMBER (*)    APPearance CLOUDY (*)    Hgb urine dipstick LARGE (*)    Protein, ur 100 (*)    Nitrite POSITIVE (*)    Leukocytes,Ua LARGE (*)    RBC / HPF >50 (*)    WBC, UA >50 (*)    All other components within normal limits  TROPONIN I (HIGH SENSITIVITY) - Abnormal; Notable for the following components:   Troponin I (High Sensitivity) 33 (*)    All other components within normal limits  SARS CORONAVIRUS 2 (HOSPITAL ORDER, [place])  CULTURE, BLOOD (ROUTINE X 2)  CULTURE, BLOOD (ROUTINE X 2)  URINE CULTURE  LACTIC ACID, PLASMA  LIPASE, BLOOD  PROCALCITONIN  PROTIME-INR  MAGNESIUM  LACTIC ACID, PLASMA  TROPONIN I (HIGH SENSITIVITY)   ____________________________________________  EKG  ED ECG REPORT I, Hinda Kehr, the attending physician, personally viewed and interpreted this  ECG.  Date: 11/05/2018 EKG Time: 23: 07 Rate: 94 Rhythm: Likely ectopic atrial rhythm, prolonged PR interval suggestive of first-degree AV block if rhythm was sinus QRS Axis: LAD Intervals: Prolonged PR interval of 232 ms, prolonged QTC of 508 ms ST/T Wave abnormalities: Non-specific ST segment / T-wave changes, but no clear evidence of acute ischemia.  Specifically, there is some depression present in V2 and less so in V3 that was not present on prior EKGs Narrative Interpretation: Does not meet STEMI criteria, suggestive of possible mild ischemia given some ST segment changes.  ____________________________________________  RADIOLOGY Ursula Alert, personally viewed and evaluated these images (plain radiographs) as part of my medical decision making, as well as reviewing the written report by the radiologist.  ED MD interpretation:  Initially concerned for LLL infiltrate on CXR, but CT chest suggests metastatic cancer.  Official radiology report(s): Ct Chest Wo Contrast  Result Date: 11/06/2018 CLINICAL DATA:  Dyspnea EXAM: CT CHEST WITHOUT CONTRAST TECHNIQUE: Multidetector CT imaging of the chest was performed following the standard protocol without IV contrast. COMPARISON:  CT dated September 11, 2013. FINDINGS: Cardiovascular: Extensive atherosclerotic calcifications are noted of the thoracic aorta. The main pulmonary artery is dilated measuring approximately 3.6 cm in diameter. The heart size is normal. Aortic calcifications are noted. Mediastinum/Nodes: There are prominent but subcentimeter mediastinal and hilar lymph nodes bilaterally. There are no pathologically enlarged axillary lymph nodes. Extensive thyroid nodules are again noted but these are essentially stable from prior study. Lungs/Pleura: There is an essentially stable opacity in the lingula measuring approximately 3.1 cm. There is a new opacity in the superior left lower lobe measuring approximately 3.5 cm. The trachea is  unremarkable. There is no pneumothorax. No large  pleural effusion. There are few additional scattered smaller pulmonary opacities bilaterally including a 1.3 cm opacity in the left upper lobe (axial series 3, image 57). Upper Abdomen: There is a new 2.4 cm hypoattenuating mass that appears to be located within hepatic segment 8/4 a. There is likely cholelithiasis without CT evidence of acute cholecystitis. The common bile duct appears dilated but this is similar to prior CT. Musculoskeletal: There is no definite acute osseous abnormality. There is an irregularity of the posterior third rib on the left which has progressed slightly from prior study. This appears chronic but is of unknown clinical significance. IMPRESSION: 1. New pulmonary nodules as detailed above measuring up to approximately 3.5 cm. In combination with a new 2.4 cm hypoattenuating liver masses concerning for metastatic disease. 2. Dilated main pulmonary artery which can be seen in patients with elevated pulmonary artery pressures. 3. Additional chronic findings as above. Aortic Atherosclerosis (ICD10-I70.0). Electronically Signed   By: Constance Holster M.D.   On: 11/06/2018 02:46   Dg Chest Port 1 View  Result Date: 11/06/2018 CLINICAL DATA:  85 year old male with sepsis. EXAM: PORTABLE CHEST 1 VIEW COMPARISON:  Chest radiograph dated 07/10/2016 FINDINGS: Confluent areas of density at the left lung base concerning for developing infiltrate. Clinical correlation is recommended. There is no pleural effusion or pneumothorax. Stable cardiac silhouette. Atherosclerotic calcification of the aorta. No acute osseous pathology. IMPRESSION: Left lung base density concerning for developing infiltrate. Electronically Signed   By: Anner Crete M.D.   On: 11/06/2018 00:44    ____________________________________________   PROCEDURES   Procedure(s) performed (including Critical Care):  .Critical Care Performed by: Hinda Kehr,  MD Authorized by: Hinda Kehr, MD   Critical care provider statement:    Critical care time (minutes):  30   Critical care time was exclusive of:  Separately billable procedures and treating other patients   Critical care was necessary to treat or prevent imminent or life-threatening deterioration of the following conditions:  Sepsis   Critical care was time spent personally by me on the following activities:  Development of treatment plan with patient or surrogate, discussions with consultants, evaluation of patient's response to treatment, examination of patient, obtaining history from patient or surrogate, ordering and performing treatments and interventions, ordering and review of laboratory studies, ordering and review of radiographic studies, pulse oximetry, re-evaluation of patient's condition and review of old charts     ____________________________________________   [place] / MDM / [place] / ED COURSE  As part of my medical decision making, I reviewed the following data within the [place] notes reviewed and incorporated, Labs reviewed , EKG interpreted , Old chart reviewed, Discussed with admitting physician (Dr. Sidney Ace) and Notes from prior ED visits      *Theodore Padilla was evaluated in Emergency Department on 11/06/2018 for the symptoms described in the history of present illness. He was evaluated in the context of the global COVID-19 pandemic, which necessitated consideration that the patient might be at risk for infection with the SARS-CoV-2 virus that causes COVID-19. Institutional protocols and algorithms that pertain to the evaluation of patients at risk for COVID-19 are in a state of rapid change based on information released by regulatory bodies including the CDC and federal and state organizations. These policies and algorithms were followed during the patient's care in the ED.  Some ED evaluations and interventions may be  delayed as a result of limited staffing during the pandemic.*  Differential diagnosis includes,  but is not limited to, sepsis, obstruction, worsening cancer, renal failure, dehydration, metabolic or electrolyte abnormality.  Pneumonia and COVID-19 are less likely.  ACS or demand ischemia is also possible.    The patient is persistently hypotensive although his blood pressure has improved after 500 mL of normal saline.  He is afebrile but he is also 85 years old.  Heart rate initially was a little bit high but has normalized.  He lives alone with hospice coming to the home but he is currently weak enough that he cannot care for himself.  He has decreased urinary output and the urine that is in the bag is very dark although he does have chronic hematuria according to his chart.  He confirmed the DNR and he does appear to have the capacity to make his own decisions.  I asked him what he wanted to do given that he knows he is nearing the end of his life and he said that he feels bad enough he thinks he needs to be in the hospital.  I will perform a septic work-up without calling a code sepsis since at the moment he does not meet criteria.  I will give him another small fluid bolus and continue to monitor him while we performed the work-up including a rapid COVID-19 swab given that he is reporting some shortness of breath although I have very low suspicion for coronavirus.  I will reassess after his work-up is complete.  Clinical Course as of Nov 05 316  Wed Nov 06, 2018  0111 slightly elevated but still within normal limits  Procalcitonin: 0.19 [CF]  0115 High-sensitivity troponin is only 33 which I think is likely secondary to some demand ischemia.  We will recheck a 2-hour level but I do not feel that this is representative of ACS  Troponin I (High Sensitivity)(!): 33 [CF]  0116 .   [CF]  0200 Lactic Acid, Venous: 1.3 [CF]  0200 The patient has what appears to be a healthcare associated urinary tract  infection although it is difficult to interpret in the setting of a chronic indwelling Foley catheter.  However he also has an acute renal failure with creatinine of 1.85 up from a baseline of 1.  His lactic acid and pro calcitonin are within normal limits.  Urine and blood cultures are pending.  Blood pressure is improved slightly after a liter of fluids.  Given his report of anaphylaxis to penicillins I am treating with aztreonam 2 g IV   [CF]  0201 SARS Coronavirus 2: NEGATIVE [CF]  0201 The chest x-ray was concerning for a possible developing a left lower lobe infiltrate.  Given the possibility of pneumonia but relative lack of respiratory symptoms and the fact that antibiotics will need to be tailored to this diagnosis, I have ordered a CT scan without contrast to further assess his lung parenchyma and determine if in fact he does have pneumonia or if this is an area of atelectasis.  CT Chest Wo Contrast [CF]  0203 Given some mild leukocytosis and his increased respiratory rate and the possibility of both pneumonia and urinary tract infection, I am officially making him code sepsis.  The work-up is already complete and I will better be able to tailor antibiotics once his chest x-ray is completed.   [CF]  0302 CT scan concerning for metastatic disease, but without evidence of pneumonia.  Discussed case with Dr. Sidney Ace with the hospitalist service who will admit.   [CF]    Clinical Course  User Index [CF] Hinda Kehr, MD     ____________________________________________  FINAL CLINICAL IMPRESSION(S) / ED DIAGNOSES  Final diagnoses:  Acute kidney injury ([place])  Dehydration  Urinary tract infection associated with indwelling urethral catheter, initial encounter ([place])  Near syncope  Elevated troponin level  Sepsis with acute renal failure without septic shock, due to unspecified organism, unspecified acute renal failure type [facility])     MEDICATIONS GIVEN DURING THIS VISIT:  Medications   sodium chloride 0.9 % bolus 1,000 mL (0 mLs Intravenous Stopped 11/06/18 0028)  aztreonam (AZACTAM) 2 g in sodium chloride 0.9 % 100 mL IVPB (0 g Intravenous Stopped 11/06/18 0255)  sodium chloride 0.9 % bolus 500 mL (0 mLs Intravenous Stopped 11/06/18 0150)     ED Discharge Orders    None       Note:  This document was prepared using Dragon voice recognition software and may include unintentional dictation errors.   Hinda Kehr, MD 11/06/18 [phone]

## 2018-11-06 NOTE — ED Notes (Signed)
Hospitalist in to eval  ?

## 2018-11-06 NOTE — ED Notes (Signed)
Pt awaiting admission

## 2018-11-06 NOTE — ED Notes (Signed)
Pt talking to Niece Carman Ching) on the phone.

## 2018-11-06 NOTE — ED Notes (Signed)
ED TO INPATIENT HANDOFF REPORT  ED Nurse Name and Phone #: Janett Kamath [phone]  S Name/Age/Gender Theodore Padilla 85 y.o. male Room/Bed: ED24A/ED24A  Code Status   Code Status: DNR  Home/SNF/Other Hospice Patient oriented x 4 Is this baseline? yes  Triage Complete: Triage complete  Chief Complaint Fall/ Weakness  Triage Note Patient presents to ED via [facility]. Patient reported that he "was getting off the commode to sit on the wheelchair and he fell foreword." Patient fell at approximately at 7 pm.  Patient reports abdominal pain. And a Hx of colon cancer. Patient received a 500 ml Bolus of fluid due to Hypotension.   Allergies Allergies  Allergen Reactions  . Ciprofloxacin Other (See Comments) and Shortness Of Breath    Low heart rate, bleeding  . Penicillins Anaphylaxis and Swelling    Has patient had a PCN reaction causing immediate rash, facial/tongue/throat swelling, SOB or lightheadedness with hypotension: Yes Has patient had a PCN reaction causing severe rash involving mucus membranes or skin necrosis: No Has patient had a PCN reaction that required hospitalization: No Has patient had a PCN reaction occurring within the last 10 years: No If all of the above answers are "NO", then may proceed with Cephalosporin use.   Marland Kitchen Penicillins   . Allopurinol Other (See Comments)    Bloody stools   . Pantoprazole Nausea Only  . Rofecoxib Rash  . Tramadol Nausea And Vomiting    Level of Care/Admitting Diagnosis ED Disposition    ED Disposition Condition [facility] Area: [place] [[id number]]  Level of Care: Med-Surg [16]  Covid Evaluation: Screening Protocol (No Symptoms)  Diagnosis: Sepsis [facility]) [[id number]]  Admitting Physician: [name] [[id number]]  Attending Physician: [name] [[id number]]  Estimated length of stay: past midnight tomorrow  Certification:: I certify this patient will need inpatient services for at least 2 midnights   PT Class (Do Not Modify): Inpatient [101]  PT Acc Code (Do Not Modify): Private [1]       B Medical/Surgery History Past Medical History:  Diagnosis Date  . Arthritis   . B12 deficiency   . BPH (benign prostatic hyperplasia)   . Chronic constipation   . Coronary artery disease    with stenting x 1  . Degeneration of spine   . Gout   . Hearing aid worn    bilateral  . History of heart artery stent 2002  . History of kidney stones   . HLD (hyperlipidemia)   . Hypertension   . Kidney stones   . Lower extremity edema 2019  . Moderate mitral insufficiency   . Moderate tricuspid insufficiency   . Osteoporosis   . Prostate cancer ([place])   . Rectal tumor   . Shortness of breath    occasional  . Sleep apnea    no CPAP, pt denies OSA  . Spine deformity 2019   has severe back pain. previously had surgery without success for improvement  . Urinary hesitancy   . Urinary retention   . Wears dentures    partial upper and lower   Past Surgical History:  Procedure Laterality Date  . APPENDECTOMY    . BACK SURGERY    . CARDIAC CATHETERIZATION  2000   with stent  . CATARACT EXTRACTION W/PHACO Right 04/18/2017   Procedure: CATARACT EXTRACTION PHACO AND INTRAOCULAR LENS PLACEMENT ([place]) RIGHT;  Surgeon: Leandrew Koyanagi, MD;  Location: [place];  Service: Ophthalmology;  Laterality: Right;  .  CATARACT EXTRACTION W/PHACO Left 05/23/2017   Procedure: CATARACT EXTRACTION PHACO AND INTRAOCULAR LENS PLACEMENT ([place]) LEFT;  Surgeon: Leandrew Koyanagi, MD;  Location: [place];  Service: Ophthalmology;  Laterality: Left;  . ESOPHAGOGASTRODUODENOSCOPY (EGD) WITH PROPOFOL N/A 07/12/2016   Procedure: ESOPHAGOGASTRODUODENOSCOPY (EGD) WITH PROPOFOL;  Surgeon: Jonathon Bellows, MD;  Location: [facility];  Service: Endoscopy;  Laterality: N/A;  . FOREIGN BODY REMOVAL     L knee  . HERNIA REPAIR     umbilicus (below)   . JOINT REPLACEMENT Bilateral    total hip arthroplasty   . KIDNEY STONE SURGERY    . LITHOTRIPSY    . LUMBAR LAMINECTOMY/DECOMPRESSION MICRODISCECTOMY  01/22/2012   Procedure: LUMBAR LAMINECTOMY/DECOMPRESSION MICRODISCECTOMY 1 LEVEL;  Surgeon: Ophelia Charter, MD;  Location: [place] [facility];  Service: Neurosurgery;  Laterality: N/A;  Lumbar two and lumbar three laminectomies  . ORCHIECTOMY Bilateral 10/16/2017   Procedure: ORCHIECTOMY;  Surgeon: Royston Cowper, MD;  Location: [facility];  Service: Urology;  Laterality: Bilateral;  . TONSILLECTOMY    . TOTAL HIP ARTHROPLASTY     bilat     A IV Location/Drains/Wounds Patient Lines/Drains/Airways Status   Active Line/Drains/Airways    Name:   Placement date:   Placement time:   Site:   Days:   Peripheral IV 10/17/18 Left Forearm   10/17/18    1132    Forearm   20   Peripheral IV 11/06/18 Left Antecubital   11/06/18    0133    Antecubital   less than 1   Peripheral IV 11/06/18 Left Hand   11/06/18    0133    Hand   less than 1   Urethral Catheter Dr. Yves Dill Latex 18 Fr.   10/16/17    1800    Latex   386   Urethral Catheter Elmo Putt, RN Triple-lumen 18 Fr.   10/17/18    1215    Triple-lumen   20   Airway   10/16/17    1725     386   Incision (Closed) 10/16/17 Groin   10/16/17    1755     386          Intake/Output Last 24 hours  Intake/Output Summary (Last 24 hours) at 11/06/2018 1021 Last data filed at 11/06/2018 0028 Gross per 24 hour  Intake 1000 ml  Output -  Net 1000 ml    Labs/Imaging Results for orders placed or performed during the hospital encounter of 11/05/18 (from the past 48 hour(s))  Comprehensive metabolic panel     Status: Abnormal   Collection Time: 11/05/18 11:12 PM  Result Value Ref Range   Sodium 133 (L) 135 - 145 mmol/L   Potassium 3.4 (L) 3.5 - 5.1 mmol/L   Chloride 101 98 - 111 mmol/L   CO2 20 (L) 22 - 32 mmol/L   Glucose, Bld 163 (H) 70 - 99 mg/dL   BUN 33 (H) 8 - 23 mg/dL   Creatinine, Ser 1.85 (H) 0.61 - 1.24 mg/dL   Calcium 8.6 (L) 8.9 - 10.3 mg/dL   Total  Protein 6.9 6.5 - 8.1 g/dL   Albumin 2.7 (L) 3.5 - 5.0 g/dL   AST 30 15 - 41 U/L   ALT 17 0 - 44 U/L   Alkaline Phosphatase 56 38 - 126 U/L   Total Bilirubin 0.4 0.3 - 1.2 mg/dL   GFR calc non Af Amer 30 (L) >60 mL/min   GFR calc Af Amer 35 (L) >60 mL/min  Anion gap 12 5 - 15    Comment: Performed at [facility], [address] [place], [place] [address]  Lipase, blood     Status: None   Collection Time: 11/05/18 11:12 PM  Result Value Ref Range   Lipase 33 11 - 51 U/L    Comment: Performed at [facility], [address] [place], [place] [address]  Brain natriuretic peptide - IF patient is dyspneic     Status: Abnormal   Collection Time: 11/05/18 11:12 PM  Result Value Ref Range   B Natriuretic Peptide 124.0 (H) 0.0 - 100.0 pg/mL    Comment: Performed at [facility], [address] [place], [place] [address]  CBC WITH DIFFERENTIAL     Status: Abnormal   Collection Time: 11/05/18 11:12 PM  Result Value Ref Range   WBC 11.0 (H) 4.0 - 10.5 K/uL   RBC 3.19 (L) 4.22 - 5.81 MIL/uL   Hemoglobin 10.0 (L) 13.0 - 17.0 g/dL   HCT 31.0 (L) 39.0 - 52.0 %   MCV 97.2 80.0 - 100.0 fL   MCH 31.3 26.0 - 34.0 pg   MCHC 32.3 30.0 - 36.0 g/dL   RDW 14.2 11.5 - 15.5 %   Platelets 271 150 - 400 K/uL   nRBC 0.0 0.0 - 0.2 %   Neutrophils Relative % 89 %   Neutro Abs 9.8 (H) 1.7 - 7.7 K/uL   Lymphocytes Relative 5 %   Lymphs Abs 0.6 (L) 0.7 - 4.0 K/uL   Monocytes Relative 5 %   Monocytes Absolute 0.6 0.1 - 1.0 K/uL   Eosinophils Relative 0 %   Eosinophils Absolute 0.0 0.0 - 0.5 K/uL   Basophils Relative 0 %   Basophils Absolute 0.0 0.0 - 0.1 K/uL   Immature Granulocytes 1 %   Abs Immature Granulocytes 0.08 (H) 0.00 - 0.07 K/uL    Comment: Performed at [facility], [address] [place],  [address]  Procalcitonin     Status: None   Collection Time: 11/05/18 11:12 PM  Result Value Ref Range   Procalcitonin 0.19 ng/mL    Comment:         Interpretation: PCT (Procalcitonin) <= 0.5 ng/mL: Systemic infection (sepsis) is not likely. Local bacterial infection is possible. (NOTE)       Sepsis PCT Algorithm           Lower Respiratory Tract                                      Infection PCT Algorithm    ----------------------------     ----------------------------         PCT < 0.25 ng/mL                PCT < 0.10 ng/mL         Strongly encourage             Strongly discourage   discontinuation of antibiotics    initiation of antibiotics    ----------------------------     -----------------------------       PCT 0.25 - 0.50 ng/mL            PCT 0.10 - 0.25 ng/mL               OR       >80% decrease in PCT  Discourage initiation of                                            antibiotics      Encourage discontinuation           of antibiotics    ----------------------------     -----------------------------         PCT >= 0.50 ng/mL              PCT 0.26 - 0.50 ng/mL               AND        <80% decrease in PCT             Encourage initiation of                                             antibiotics       Encourage continuation           of antibiotics    ----------------------------     -----------------------------        PCT >= 0.50 ng/mL                  PCT > 0.50 ng/mL               AND         increase in PCT                  Strongly encourage                                      initiation of antibiotics    Strongly encourage escalation           of antibiotics                                     -----------------------------                                           PCT <= 0.25 ng/mL                                                 OR                                        > 80% decrease in PCT                                     Discontinue / Do not initiate  antibiotics Performed at [facility], [address] [place], [place] [address]    Protime-INR     Status: None   Collection Time: 11/05/18 11:12 PM  Result Value Ref Range   Prothrombin Time 15.1 11.4 - 15.2 seconds   INR 1.2 0.8 - 1.2    Comment: (NOTE) INR goal varies based on device and disease states. Performed at [facility], [address] [place], [place] [address]   Urinalysis, Complete w Microscopic     Status: Abnormal   Collection Time: 11/05/18 11:12 PM  Result Value Ref Range   Color, Urine AMBER (A) YELLOW    Comment: BIOCHEMICALS MAY BE AFFECTED BY COLOR   APPearance CLOUDY (A) CLEAR   Specific Gravity, Urine 1.019 1.005 - 1.030   pH 5.0 5.0 - 8.0   Glucose, UA NEGATIVE NEGATIVE mg/dL   Hgb urine dipstick LARGE (A) NEGATIVE   Bilirubin Urine NEGATIVE NEGATIVE   Ketones, ur NEGATIVE NEGATIVE mg/dL   Protein, ur 100 (A) NEGATIVE mg/dL   Nitrite POSITIVE (A) NEGATIVE   Leukocytes,Ua LARGE (A) NEGATIVE   RBC / HPF >50 (H) 0 - 5 RBC/hpf   WBC, UA >50 (H) 0 - 5 WBC/hpf   Bacteria, UA NONE SEEN NONE SEEN   Squamous Epithelial / LPF NONE SEEN 0 - 5   Mucus PRESENT    Hyaline Casts, UA PRESENT     Comment: Performed at [facility], [place], [place] [address]  Troponin I (High Sensitivity)     Status: Abnormal   Collection Time: 11/05/18 11:12 PM  Result Value Ref Range   Troponin I (High Sensitivity) 33 (H) <18 ng/L    Comment: (NOTE) Elevated high sensitivity troponin I (hsTnI) values and significant  changes across serial measurements may suggest ACS but many other  chronic and acute conditions are known to elevate hsTnI results.  Refer to the "Links" section for chest pain algorithms and additional  guidance. Performed at [facility], [address] [place], [place] [address]   Magnesium     Status: None   Collection Time: 11/05/18 11:12 PM  Result Value Ref Range   Magnesium 2.3 1.7 - 2.4 mg/dL    Comment: Performed at [facility], [address] [place], [place] [address]   Lactic acid, plasma     Status: None   Collection Time: 11/06/18 12:29 AM  Result Value Ref Range   Lactic Acid, Venous 1.3 0.5 - 1.9 mmol/L    Comment: Performed at [facility], [address] [place], [place] [address]  Blood Culture (routine x 2)     Status: None (Preliminary result)   Collection Time: 11/06/18 12:29 AM   Specimen: BLOOD  Result Value Ref Range   Specimen Description BLOOD RIGHT ASSIST CONTROL    Special Requests      BOTTLES DRAWN AEROBIC AND ANAEROBIC Blood Culture adequate volume   Culture      NO GROWTH < 12 HOURS Performed at [facility], [address], [place], [place] [address]    Report Status PENDING   SARS Coronavirus 2 (CEPHEID - Performed in [place] hospital lab), Hosp Order     Status: None   Collection Time: 11/06/18 12:29 AM   Specimen: Urine, Catheterized; Nasopharyngeal  Result Value Ref Range   SARS Coronavirus 2 NEGATIVE NEGATIVE    Comment: (NOTE) If result is NEGATIVE SARS-CoV-2 target nucleic acids are NOT DETECTED. The SARS-CoV-2 RNA is generally  detectable in upper and lower  respiratory specimens during the acute phase of infection. The lowest  concentration of SARS-CoV-2 viral copies this assay can detect is 250  copies / mL. A negative result does not preclude SARS-CoV-2 infection  and should not be used as the sole basis for treatment or other  patient management decisions.  A negative result may occur with  improper specimen collection / handling, submission of specimen other  than nasopharyngeal swab, presence of viral mutation(s) within the  areas targeted by this assay, and inadequate number of viral copies  (<250 copies / mL). A negative result must be combined with clinical  observations, patient history, and epidemiological information. If result is POSITIVE SARS-CoV-2 target nucleic acids are DETECTED. The SARS-CoV-2 RNA is generally detectable in upper and lower  respiratory specimens  dur ing the acute phase of infection.  Positive  results are indicative of active infection with SARS-CoV-2.  Clinical  correlation with patient history and other diagnostic information is  necessary to determine patient infection status.  Positive results do  not rule out bacterial infection or co-infection with other viruses. If result is PRESUMPTIVE POSTIVE SARS-CoV-2 nucleic acids MAY BE PRESENT.   A presumptive positive result was obtained on the submitted specimen  and confirmed on repeat testing.  While 2019 novel coronavirus  (SARS-CoV-2) nucleic acids may be present in the submitted sample  additional confirmatory testing may be necessary for epidemiological  and / or clinical management purposes  to differentiate between  SARS-CoV-2 and other Sarbecovirus currently known to infect humans.  If clinically indicated additional testing with an alternate test  methodology [phone] is advised. The SARS-CoV-2 RNA is generally  detectable in upper and lower respiratory sp ecimens during the acute  phase of infection. The expected result is Negative. Fact Sheet for Patients:  StrictlyIdeas.no Fact Sheet for Healthcare Providers: BankingDealers.co.za This test is not yet approved or cleared by the [place] FDA and has been authorized for detection and/or diagnosis of SARS-CoV-2 by FDA under an Emergency Use Authorization (EUA).  This EUA will remain in effect (meaning this test can be used) for the duration of the COVID-19 declaration under Section 564(b)(1) of the Act, 21 U.S.C. section 360bbb-3(b)(1), unless the authorization is terminated or revoked sooner. Performed at [facility], [address] [place], [place] [address]   Blood Culture (routine x 2)     Status: None (Preliminary result)   Collection Time: 11/06/18 12:30 AM   Specimen: BLOOD  Result Value Ref Range   Specimen Description BLOOD LEFT ASSIST  CONTROL    Special Requests      BOTTLES DRAWN AEROBIC AND ANAEROBIC Blood Culture adequate volume   Culture      NO GROWTH < 12 HOURS Performed at [facility], [address] [place], [place] [address]    Report Status PENDING   Basic metabolic panel     Status: None   Collection Time: 11/06/18  4:55 AM  Result Value Ref Range   Sodium TEST WILL BE CREDITED 135 - 145 mmol/L    Comment:  INFORMED OF POSSIBLE SPECIMEN CONTAMINATION ISSUE BY RAQUEL DAVID RN SDR CORRECTED ON 06/24 AT 0535: PREVIOUSLY REPORTED AS 138    Potassium TEST WILL BE CREDITED 3.5 - 5.1 mmol/L    Comment: CORRECTED ON 06/24 AT 0535: PREVIOUSLY REPORTED AS 6.1   Chloride TEST WILL BE CREDITED 98 - 111 mmol/L    Comment: CORRECTED ON 06/24 AT 0535: PREVIOUSLY REPORTED AS  112   CO2 TEST WILL BE CREDITED 22 - 32 mmol/L    Comment: CORRECTED ON 06/24 AT 0535: PREVIOUSLY REPORTED AS 22   Glucose, Bld TEST WILL BE CREDITED 70 - 99 mg/dL    Comment: CORRECTED ON 06/24 AT 0535: PREVIOUSLY REPORTED AS 100   BUN TEST WILL BE CREDITED 8 - 23 mg/dL    Comment: CORRECTED ON 06/24 AT 0536: PREVIOUSLY REPORTED AS 31   Creatinine, Ser TEST WILL BE CREDITED 0.61 - 1.24 mg/dL    Comment: CORRECTED ON 06/24 AT 0536: PREVIOUSLY REPORTED AS 1.48   Calcium TEST WILL BE CREDITED 8.9 - 10.3 mg/dL    Comment: CORRECTED ON 06/24 AT 0536: PREVIOUSLY REPORTED AS 7.0   GFR calc non Af Amer NOT CALCULATED >60 mL/min    Comment: CORRECTED ON 06/24 AT 0536: PREVIOUSLY REPORTED AS 40   GFR calc Af Amer NOT CALCULATED >60 mL/min    Comment: CORRECTED ON 06/24 AT 0536: PREVIOUSLY REPORTED AS 46   Anion gap TEST WILL BE CREDITED 5 - 15    Comment: Performed at [facility], [address] [place], [place] [address] CORRECTED ON 06/24 AT 0539: PREVIOUSLY REPORTED AS 4   CBC     Status: Abnormal   Collection Time: 11/06/18  4:55 AM  Result Value Ref Range   WBC 9.6 4.0 - 10.5 K/uL   RBC 2.57 (L) 4.22 - 5.81 MIL/uL    Hemoglobin 8.2 (L) 13.0 - 17.0 g/dL   HCT 25.3 (L) 39.0 - 52.0 %   MCV 98.4 80.0 - 100.0 fL   MCH 31.9 26.0 - 34.0 pg   MCHC 32.4 30.0 - 36.0 g/dL   RDW 14.5 11.5 - 15.5 %   Platelets 186 150 - 400 K/uL   nRBC 0.0 0.0 - 0.2 %    Comment: Performed at [facility], [address] [place], [place] [address]  CBC     Status: Abnormal   Collection Time: 11/06/18  5:30 AM  Result Value Ref Range   WBC 10.3 4.0 - 10.5 K/uL   RBC 3.05 (L) 4.22 - 5.81 MIL/uL   Hemoglobin 9.5 (L) 13.0 - 17.0 g/dL   HCT 29.7 (L) 39.0 - 52.0 %   MCV 97.4 80.0 - 100.0 fL   MCH 31.1 26.0 - 34.0 pg   MCHC 32.0 30.0 - 36.0 g/dL   RDW 14.5 11.5 - 15.5 %   Platelets 210 150 - 400 K/uL   nRBC 0.0 0.0 - 0.2 %    Comment: Performed at [facility], [address] [place],  [address]  Basic metabolic panel     Status: Abnormal   Collection Time: 11/06/18  5:30 AM  Result Value Ref Range   Sodium 133 (L) 135 - 145 mmol/L   Potassium 3.7 3.5 - 5.1 mmol/L   Chloride 99 98 - 111 mmol/L   CO2 24 22 - 32 mmol/L   Glucose, Bld 109 (H) 70 - 99 mg/dL   BUN 35 (H) 8 - 23 mg/dL   Creatinine, Ser 1.60 (H) 0.61 - 1.24 mg/dL   Calcium 8.0 (L) 8.9 - 10.3 mg/dL   GFR calc non Af Amer 36 (L) >60 mL/min   GFR calc Af Amer 42 (L) >60 mL/min   Anion gap 10 5 - 15    Comment: Performed at [facility], [address] [place],  [address]   Ct Chest Wo Contrast  Result Date: 11/06/2018 CLINICAL DATA:  Dyspnea EXAM: CT CHEST WITHOUT CONTRAST TECHNIQUE: Multidetector CT imaging of the chest was performed following the standard protocol without IV contrast. COMPARISON:  CT dated September 11, 2013. FINDINGS: Cardiovascular: Extensive atherosclerotic calcifications are noted of the thoracic aorta. The main pulmonary artery is dilated measuring approximately 3.6 cm in diameter. The heart size is normal. Aortic calcifications are noted. Mediastinum/Nodes: There are prominent but subcentimeter  mediastinal and hilar lymph nodes bilaterally. There are no pathologically enlarged axillary lymph nodes. Extensive thyroid nodules are again noted but these are essentially stable from prior study. Lungs/Pleura: There is an essentially stable opacity in the lingula measuring approximately 3.1 cm. There is a new opacity in the superior left lower lobe measuring approximately 3.5 cm. The trachea is unremarkable. There is no pneumothorax. No large pleural effusion. There are few additional scattered smaller pulmonary opacities bilaterally including a 1.3 cm opacity in the left upper lobe (axial series 3, image 57). Upper Abdomen: There is a new 2.4 cm hypoattenuating mass that appears to be located within hepatic segment 8/4 a. There is likely cholelithiasis without CT evidence of acute cholecystitis. The common bile duct appears dilated but this is similar to prior CT. Musculoskeletal: There is no definite acute osseous abnormality. There is an irregularity of the posterior third rib on the left which has progressed slightly from prior study. This appears chronic but is of unknown clinical significance. IMPRESSION: 1. New pulmonary nodules as detailed above measuring up to approximately 3.5 cm. In combination with a new 2.4 cm hypoattenuating liver masses concerning for metastatic disease. 2. Dilated main pulmonary artery which can be seen in patients with elevated pulmonary artery pressures. 3. Additional chronic findings as above. Aortic Atherosclerosis (ICD10-I70.0). Electronically Signed   By: Constance Holster M.D.   On: 11/06/2018 02:46   Dg Chest Port 1 View  Result Date: 11/06/2018 CLINICAL DATA:  85 year old male with sepsis. EXAM: PORTABLE CHEST 1 VIEW COMPARISON:  Chest radiograph dated 07/10/2016 FINDINGS: Confluent areas of density at the left lung base concerning for developing infiltrate. Clinical correlation is recommended. There is no pleural effusion or pneumothorax. Stable cardiac silhouette.  Atherosclerotic calcification of the aorta. No acute osseous pathology. IMPRESSION: Left lung base density concerning for developing infiltrate. Electronically Signed   By: Anner Crete M.D.   On: 11/06/2018 00:44    Pending Labs Unresulted Labs (From admission, onward)    Start     Ordered   11/13/18 0500  Creatinine, serum  (enoxaparin (LOVENOX)    CrCl >/= 30 ml/min)  Weekly,   STAT    Comments: while on enoxaparin therapy    11/06/18 0348   11/07/18 0500  CBC  Tomorrow morning,   STAT     11/06/18 0823   11/07/18 8280  Basic metabolic panel  Tomorrow morning,   STAT     11/06/18 0823   11/06/18 0003  Lactic acid, plasma  STAT Now then every 3 hours,   STAT     11/06/18 0003   11/06/18 0003  Urine culture  Add-on,   AD     11/06/18 0003          Vitals/Pain Today's Vitals   11/06/18 0800 11/06/18 0815 11/06/18 0828 11/06/18 0830  BP: (!) 119/44   119/68  Pulse: (!) 54 (!) 54  75  Resp:  (!) 21  (!) 21  Temp:      TempSrc:      SpO2: 96% 100%  90%  Weight:  Height:      PainSc:   7      Isolation Precautions No active isolations  Medications Medications  morphine CONCENTRATE 10 MG/0.5ML oral solution 5-10 mg (has no administration in time range)  bisacodyl (DULCOLAX) EC tablet 5 mg (has no administration in time range)  lactulose (CHRONULAC) 10 GM/15ML solution 20 g (has no administration in time range)  polyethylene glycol (MIRALAX / GLYCOLAX) packet 17 g (has no administration in time range)  oxybutynin (DITROPAN) tablet 5 mg (has no administration in time range)  multivitamin with minerals tablet 1 tablet (has no administration in time range)  diphenhydrAMINE (BENADRYL) capsule 25 mg (has no administration in time range)  enoxaparin (LOVENOX) injection 30 mg (has no administration in time range)  0.9 % NaCl with KCl 20 mEq/ L  infusion ( Intravenous New Bag/Given 11/06/18 0412)  acetaminophen (TYLENOL) tablet 650 mg (has no administration in time range)     Or  acetaminophen (TYLENOL) suppository 650 mg (has no administration in time range)  traZODone (DESYREL) tablet 25 mg (has no administration in time range)  magnesium hydroxide (MILK OF MAGNESIA) suspension 30 mL (has no administration in time range)  ondansetron (ZOFRAN) tablet 4 mg (has no administration in time range)    Or  ondansetron (ZOFRAN) injection 4 mg (has no administration in time range)  aztreonam (AZACTAM) 0.5 g in dextrose 5 % 50 mL IVPB (has no administration in time range)  oxyCODONE-acetaminophen (PERCOCET/ROXICET) 5-325 MG per tablet 1-2 tablet (has no administration in time range)  morphine 2 MG/ML injection 2-4 mg (2 mg Intravenous Given 11/06/18 0451)  sodium chloride 0.9 % bolus 1,000 mL (0 mLs Intravenous Stopped 11/06/18 0028)  aztreonam (AZACTAM) 2 g in sodium chloride 0.9 % 100 mL IVPB (0 g Intravenous Stopped 11/06/18 0255)  sodium chloride 0.9 % bolus 500 mL (0 mLs Intravenous Stopped 11/06/18 0150)  potassium chloride (KLOR-CON) packet 40 mEq (40 mEq Oral Given 11/06/18 0405)    Mobility High fall risk   Focused Assessments    R Recommendations: See Admitting Provider Note  Report given to:

## 2018-11-06 NOTE — ED Notes (Signed)
Report received from [facility]. Patient care assumed. Patient/RN introduction complete. Pt resting quietly with eyes closed, no evidence or pain or discomfort at this time. Pt awaiting disposition.

## 2018-11-06 NOTE — ED Notes (Signed)
Pt transported to CT ?

## 2018-11-06 NOTE — Progress Notes (Signed)
PHARMACIST - PHYSICIAN COMMUNICATION  CONCERNING:  Enoxaparin (Lovenox) for DVT Prophylaxis   RECOMMENDATION: Patient was prescribed enoxaprin 40mg  q24 hours for VTE prophylaxis.   Filed Weights   11/05/18 2307 11/06/18 0225  Weight: 180 lb (81.6 kg) 185 lb (83.9 kg)    Body mass index is 29.86 kg/m.  Estimated Creatinine Clearance: 24.8 mL/min (A) (by C-G formula based on SCr of 1.85 mg/dL (H)).   Patient is candidate for enoxaparin 30mg  every 24 hours based on CrCl <74ml/min   DESCRIPTION: Pharmacy has adjusted enoxaparin dose.   Patient is now receiving enoxaparin 30mg  every 24 hours.  Pernell Dupre, PharmD, BCPS Clinical Pharmacist 11/06/2018 3:52 AM

## 2018-11-06 NOTE — Consult Note (Signed)
Pharmacy Antibiotic Note  Theodore Padilla is a 85 y.o. male admitted on 11/05/2018 with UTI.  Pharmacy has been consulted for Aztreonam dosing. Patient has a reported anaphylaxis reaction to PCN.   Plan: Start aztreonam 500mg  IV every 12 hours  Height: 5\' 6"  (167.6 cm) Weight: 185 lb (83.9 kg) IBW/kg (Calculated) : 63.8  Temp (24hrs), Avg:98.4 F (36.9 C), Min:98.4 F (36.9 C), Max:98.4 F (36.9 C)  Recent Labs  Lab 11/05/18 2312 11/06/18 0029  WBC 11.0*  --   CREATININE 1.85*  --   LATICACIDVEN  --  1.3    Estimated Creatinine Clearance: 24.8 mL/min (A) (by C-G formula based on SCr of 1.85 mg/dL (H)).    Allergies  Allergen Reactions  . Ciprofloxacin Other (See Comments) and Shortness Of Breath    Low heart rate, bleeding  . Penicillins Anaphylaxis and Swelling    Has patient had a PCN reaction causing immediate rash, facial/tongue/throat swelling, SOB or lightheadedness with hypotension: Yes Has patient had a PCN reaction causing severe rash involving mucus membranes or skin necrosis: No Has patient had a PCN reaction that required hospitalization: No Has patient had a PCN reaction occurring within the last 10 years: No If all of the above answers are "NO", then may proceed with Cephalosporin use.   Marland Kitchen Penicillins   . Allopurinol Other (See Comments)    Bloody stools   . Pantoprazole Nausea Only  . Rofecoxib Rash  . Tramadol Nausea And Vomiting    Antimicrobials this admission: 6/24 aztreonam  >>   Microbiology results: 6/24 BCx: pending 6/24 UCx: pending  Thank you for allowing pharmacy to be a part of this patient's care.  Pernell Dupre, PharmD, BCPS Clinical Pharmacist 11/06/2018 4:57 AM

## 2018-11-06 NOTE — ED Notes (Signed)
Attempted to call report, unable to give report at this time  

## 2018-11-06 NOTE — H&P (Addendum)
[place] [facility] [place] NAME: Theodore Padilla    MR#:  [id number]  DATE OF BIRTH:  07/19/1921  DATE OF ADMISSION:  11/06/2018  PRIMARY CARE PHYSICIAN: Baxter Hire, MD   REQUESTING/REFERRING PHYSICIAN: Hinda Kehr, MD CHIEF COMPLAINT:   Chief Complaint  Patient presents with   Fall    HISTORY OF PRESENT ILLNESS:  Theodore Padilla  is a 85 y.o. Caucasian male with a known history of metastatic prostate cancer and other medical problems that will be mentioned below, who presented to the emergency room with acute progressive onset of generalized weakness which has been significantly worse lately to significantly affect his ambulation, with associated syncope and fall without injuries.  He denies any cough or wheezing or dyspnea.  No dysuria, oliguria or hematuria or flank pain.  No chest pain or palpitations.  He has been having abdominal pain mainly in the suprapubic area.  No diarrhea or melena or bright red bleeding per rectum.  He tells me that he has been on hospice as outpatient.  He said that he would just want to get something to put him to sleep and this disease is not going to be cured.  Upon presentation to the emergency room, vital signs revealed hypotension with a blood pressure of 85/56 with otherwise normal vital signs.  Blood pressure came up with hydration to 108/74 and respiratory rate was 23..  Blood pressure was later up to 123/67.  Portable chest ray showed left lung base density concerning for developing infiltrates.  Labs revealed hyponatremia and hypochloremia as well as elevated BUN and creatinine with magnesium of 2.3.  Lactic acid was 1.3 and procalcitonin 0.19 CBC showed leukocytosis of 11 with neutrophilia.  COVID-19 test came back negative and urinalysis was positive for UTI.  Urine and blood cultures were sent.  He was given IV aztreonam and 1.5 L bolus of IV normal saline.  He will be admitted to a medically monitored bed  for further evaluation and management.    PAST MEDICAL HISTORY:   Past Medical History:  Diagnosis Date   Arthritis    B12 deficiency    BPH (benign prostatic hyperplasia)    Chronic constipation    Coronary artery disease    with stenting x 1   Degeneration of spine    Gout    Hearing aid worn    bilateral   History of heart artery stent 2002   History of kidney stones    HLD (hyperlipidemia)    Hypertension    Kidney stones    Lower extremity edema 2019   Moderate mitral insufficiency    Moderate tricuspid insufficiency    Osteoporosis    Prostate cancer (HCC)    Rectal tumor    Shortness of breath    occasional   Sleep apnea    no CPAP, pt denies OSA   Spine deformity 2019   has severe back pain. previously had surgery without success for improvement   Urinary hesitancy    Urinary retention    Wears dentures    partial upper and lower    PAST SURGICAL HISTORY:   Past Surgical History:  Procedure Laterality Date   [place]  2000   with stent   CATARACT EXTRACTION W/PHACO Right 04/18/2017   Procedure: CATARACT EXTRACTION PHACO AND INTRAOCULAR LENS PLACEMENT ([employer]) RIGHT;  Surgeon: Leandrew Koyanagi, MD;  Location: [place]  [facility];  Service: Ophthalmology;  Laterality: Right;   CATARACT EXTRACTION W/PHACO Left 05/23/2017   Procedure: CATARACT EXTRACTION PHACO AND INTRAOCULAR LENS PLACEMENT ([place]) LEFT;  Surgeon: Leandrew Koyanagi, MD;  Location: [place];  Service: Ophthalmology;  Laterality: Left;   ESOPHAGOGASTRODUODENOSCOPY (EGD) WITH PROPOFOL N/A 07/12/2016   Procedure: ESOPHAGOGASTRODUODENOSCOPY (EGD) WITH PROPOFOL;  Surgeon: Jonathon Bellows, MD;  Location: [facility];  Service: Endoscopy;  Laterality: N/A;   FOREIGN BODY REMOVAL     L knee   HERNIA REPAIR     umbilicus (below)    JOINT REPLACEMENT Bilateral    total hip arthroplasty   KIDNEY STONE SURGERY      LITHOTRIPSY     LUMBAR LAMINECTOMY/DECOMPRESSION MICRODISCECTOMY  01/22/2012   Procedure: LUMBAR LAMINECTOMY/DECOMPRESSION MICRODISCECTOMY 1 LEVEL;  Surgeon: Ophelia Charter, MD;  Location: [place] [facility];  Service: Neurosurgery;  Laterality: N/A;  Lumbar two and lumbar three laminectomies   ORCHIECTOMY Bilateral 10/16/2017   Procedure: ORCHIECTOMY;  Surgeon: Royston Cowper, MD;  Location: [facility];  Service: Urology;  Laterality: Bilateral;   TONSILLECTOMY     TOTAL HIP ARTHROPLASTY     bilat    SOCIAL HISTORY:   Social History   Tobacco Use   Smoking status: Never Smoker   Smokeless tobacco: Never Used   Tobacco comment: quit 1960  Substance Use Topics   Alcohol use: Not Currently    Comment: very rarely    FAMILY HISTORY:   Family History  Problem Relation Age of Onset   Kidney Stones Other    Kidney disease Neg Hx    Prostate cancer Neg Hx     DRUG ALLERGIES:   Allergies  Allergen Reactions   Ciprofloxacin Other (See Comments) and Shortness Of Breath    Low heart rate, bleeding   Penicillins Anaphylaxis and Swelling    Has patient had a PCN reaction causing immediate rash, facial/tongue/throat swelling, SOB or lightheadedness with hypotension: Yes Has patient had a PCN reaction causing severe rash involving mucus membranes or skin necrosis: No Has patient had a PCN reaction that required hospitalization: No Has patient had a PCN reaction occurring within the last 10 years: No If all of the above answers are "NO", then may proceed with Cephalosporin use.    Penicillins    Allopurinol Other (See Comments)    Bloody stools    Pantoprazole Nausea Only   Rofecoxib Rash   Tramadol Nausea And Vomiting    REVIEW OF SYSTEMS:   ROS As per history of present illness. All pertinent systems were reviewed above. Constitutional,  HEENT, cardiovascular, respiratory, GI, GU, musculoskeletal, neuro, psychiatric, endocrine,  integumentary and  hematologic systems were reviewed and are otherwise  negative/unremarkable except for positive findings mentioned above in the HPI.   MEDICATIONS AT HOME:   Prior to Admission medications   Medication Sig Start Date End Date Taking? Authorizing Provider  acetaminophen-codeine (TYLENOL #3) 300-30 MG tablet Take 1 tablet by mouth every 4 (four) hours as needed for moderate pain. 07/10/18   Cammie Sickle, MD  [facility] ALLERGY 25 MG capsule Take 1 capsule by mouth at bedtime as needed. 10/25/18   [provider]  [facility] BISACODYL 5 MG EC tablet TAKE 2-4 TABLETS BY MOUTH DAILY 08/15/18   Cammie Sickle, MD  dexamethasone (DECADRON) 2 MG tablet Take 2 mg by mouth daily. 10/23/18   [provider]  dexamethasone (DECADRON) 4 MG tablet Take 0.5 tablets (2 mg total) by mouth daily. Patient not taking: Reported  on 11/06/2018 07/09/18   Borders, Kirt Boys, NP  hydrochlorothiazide (HYDRODIURIL) 12.5 MG tablet Take 12.5 mg by mouth daily. 02/11/18   [provider]  lactulose (CHRONULAC) 10 GM/15ML solution Take 30 mLs (20 g total) by mouth 2 (two) times daily as needed for mild constipation. 07/09/18   Borders, Kirt Boys, NP  magnesium hydroxide (MILK OF MAGNESIA) 800 MG/5ML suspension Take 5 mLs by mouth daily as needed for constipation.    [provider]  Misc Natural Products (TART CHERRY ADVANCED PO) Take 1 capsule by mouth daily as needed (gout pain).    [provider]  Morphine Sulfate (MORPHINE CONCENTRATE) 10 mg / 0.5 ml concentrated solution Take 0.25-0.5 mLs (5-10 mg total) by mouth every 4 (four) hours as needed for moderate pain, severe pain or shortness of breath. 07/11/18   Borders, Kirt Boys, NP  Multiple Vitamin (MULTIVITAMIN WITH MINERALS) TABS tablet Take 1 tablet by mouth daily.    [provider]  oxybutynin (DITROPAN) 5 MG tablet Take 1 tablet by mouth every 8 (eight) hours as needed. 10/25/18   [provider]    sulfamethoxazole-trimethoprim (BACTRIM DS) 800-160 MG tablet Take 1 tablet by mouth 2 (two) times daily. Patient not taking: Reported on 11/06/2018 09/27/18   Borders, Kirt Boys, NP      VITAL SIGNS:  Blood pressure (!) 104/43, pulse (!) 58, temperature 98.4 F (36.9 C), temperature source Oral, resp. rate (!) 25, height 5\' 6"  (1.676 m), weight 83.9 kg, SpO2 98 %.  PHYSICAL EXAMINATION:  Physical Exam  GENERAL:  85 y.o.-year-old Caucasian male patient lying in the bed with no acute distress.  EYES: Pupils equal, round, reactive to light and accommodation. No scleral icterus.  Positive pallor.  Extraocular muscles intact.  HEENT: Head atraumatic, normocephalic. Oropharynx and nasopharynx clear.  NECK:  Supple, no jugular venous distention. No thyroid enlargement, no tenderness.  LUNGS: Normal breath sounds bilaterally, no wheezing, rales,rhonchi or crepitation. No use of accessory muscles of respiration.  CARDIOVASCULAR: Regular rate and rhythm, S1, S2 normal. No murmurs, rubs, or gallops.  ABDOMEN: Soft with suprapubic tenderness without rebound tenderness guarding or rigidity.  Bowel sounds present. No organomegaly or mass.  EXTREMITIES: No pedal edema, cyanosis, or clubbing.  NEUROLOGIC: Cranial nerves II through XII are intact. Muscle strength 5/5 in all extremities. Sensation intact. Gait not checked.  PSYCHIATRIC: The patient is alert and oriented x 3.  Normal affect and good eye contact. SKIN: No obvious rash, lesion, or ulcer.   LABORATORY PANEL:   CBC Recent Labs  Lab 11/05/18 2312  WBC 11.0*  HGB 10.0*  HCT 31.0*  PLT 271   ------------------------------------------------------------------------------------------------------------------  Chemistries  Recent Labs  Lab 11/05/18 2312  NA 133*  K 3.4*  CL 101  CO2 20*  GLUCOSE 163*  BUN 33*  CREATININE 1.85*  CALCIUM 8.6*  MG 2.3  AST 30  ALT 17  ALKPHOS 56  BILITOT 0.4    ------------------------------------------------------------------------------------------------------------------  Cardiac Enzymes No results for input(s): TROPONINI in the last 168 hours. ------------------------------------------------------------------------------------------------------------------  RADIOLOGY:  Ct Chest Wo Contrast  Result Date: 11/06/2018 CLINICAL DATA:  Dyspnea EXAM: CT CHEST WITHOUT CONTRAST TECHNIQUE: Multidetector CT imaging of the chest was performed following the standard protocol without IV contrast. COMPARISON:  CT dated September 11, 2013. FINDINGS: Cardiovascular: Extensive atherosclerotic calcifications are noted of the thoracic aorta. The main pulmonary artery is dilated measuring approximately 3.6 cm in diameter. The heart size is normal. Aortic calcifications are noted. Mediastinum/Nodes: There are  prominent but subcentimeter mediastinal and hilar lymph nodes bilaterally. There are no pathologically enlarged axillary lymph nodes. Extensive thyroid nodules are again noted but these are essentially stable from prior study. Lungs/Pleura: There is an essentially stable opacity in the lingula measuring approximately 3.1 cm. There is a new opacity in the superior left lower lobe measuring approximately 3.5 cm. The trachea is unremarkable. There is no pneumothorax. No large pleural effusion. There are few additional scattered smaller pulmonary opacities bilaterally including a 1.3 cm opacity in the left upper lobe (axial series 3, image 57). Upper Abdomen: There is a new 2.4 cm hypoattenuating mass that appears to be located within hepatic segment 8/4 a. There is likely cholelithiasis without CT evidence of acute cholecystitis. The common bile duct appears dilated but this is similar to prior CT. Musculoskeletal: There is no definite acute osseous abnormality. There is an irregularity of the posterior third rib on the left which has progressed slightly from prior study. This  appears chronic but is of unknown clinical significance. IMPRESSION: 1. New pulmonary nodules as detailed above measuring up to approximately 3.5 cm. In combination with a new 2.4 cm hypoattenuating liver masses concerning for metastatic disease. 2. Dilated main pulmonary artery which can be seen in patients with elevated pulmonary artery pressures. 3. Additional chronic findings as above. Aortic Atherosclerosis (ICD10-I70.0). Electronically Signed   By: Constance Holster M.D.   On: 11/06/2018 02:46   Dg Chest Port 1 View  Result Date: 11/06/2018 CLINICAL DATA:  85 year old male with sepsis. EXAM: PORTABLE CHEST 1 VIEW COMPARISON:  Chest radiograph dated 07/10/2016 FINDINGS: Confluent areas of density at the left lung base concerning for developing infiltrate. Clinical correlation is recommended. There is no pleural effusion or pneumothorax. Stable cardiac silhouette. Atherosclerotic calcification of the aorta. No acute osseous pathology. IMPRESSION: Left lung base density concerning for developing infiltrate. Electronically Signed   By: Anner Crete M.D.   On: 11/06/2018 00:44      IMPRESSION AND PLAN:   1.  Sepsis, secondary to UTI.  The patient will be admitted to medical monitored bed.  He will be placed on IV aztreonam and will follow urine culture and sensitivity as well as follow lactic acid.  Chest x-ray showing left lower lobe suspected infiltrate however the patient has no respiratory symptoms.  2.  Syncope.  This could be related to hypotension.  We will hydrate with IV normal saline and follow orthostatics.  He will be monitored for arrhythmias.  This is highly suspicious for orthostatic hypotension.  3.  Acute kidney injury.  This is likely prerenal due to volume depletion and dehydration.  He will be placed on hydration with IV normal saline and will follow BMP.  4.  Hypokalemia.  Potassium will be replaced and magnesium came back normal.  5.  Generalized weakness.  This is  multifactorial secondary to #1, #3 as well as his metastatic prostate.  Physical therapy consult will be obtained.  Cancer.  6.  DVT prophylaxis.  Subcutaneous Lovenox.  All the records are reviewed and case discussed with ED provider. The plan of care was discussed in details with the patient (and family). I answered all questions. The patient agreed to proceed with the above mentioned plan. Further management will depend upon hospital course.   CODE STATUS: DNR/DNI.  TOTAL TIME TAKING CARE OF THIS PATIENT: 50 minutes.    [name] M.D on 11/06/2018 at 3:48 AM  Pager - [phone]  After 6pm go to [email or web address] - password  [email or web address]  [facility]  Office  [phone]  CC: Primary care physician; Baxter Hire, MD   Note: This dictation was prepared with Dragon dictation along with smaller phrase technology. Any transcriptional errors that result from this process are unintentional.

## 2018-11-06 NOTE — TOC Progression Note (Signed)
Transition of Care [facility]) - Progression Note    Patient Details  Name: Theodore Padilla MRN: [id number] Date of Birth: 1922/05/08  Transition of Care [facility]) CM/SW Contact  Marshell Garfinkel, RN Phone Number: 11/06/2018, 9:49 AM  Clinical Narrative:     Message received from Santiago Glad with [facility] that he is followed by Hospice.        Expected Discharge Plan and Services                                                 Social Determinants of Health (SDOH) Interventions    Readmission Risk Interventions No flowsheet data found.

## 2018-11-06 NOTE — Progress Notes (Addendum)
Pateint's friend Tan brought patient's dentures, cell phone, cell phone charger and glasses, Probation officer met Demarkis outside to [employer]. Items labeled and given to patient, he was awake and aware . Items placed on his over bed table. Hospital care team made aware.  Flo Shanks BSN, RN, [facility] [phone]

## 2018-11-06 NOTE — ED Notes (Signed)
Pt resting but states abd pain is 10/10 at this time, will notify admitting physician.

## 2018-11-06 NOTE — Progress Notes (Signed)
CODE SEPSIS - PHARMACY COMMUNICATION  **Broad Spectrum Antibiotics should be administered within 1 hour of Sepsis diagnosis**  Time Code Sepsis Called/Page Received: @ 0205  Antibiotics Ordered: aztreonam   Time of 1st antibiotic administration: @ 0205  Additional action taken by pharmacy: Reviewed patient's PCN allergy. Allergy reported as anaphylaxis.  Review of patient's record did not show any history of patient receiving IV/oral cephalosporins. Continue with aztreonam for now.    Pernell Dupre, PharmD, BCPS Clinical Pharmacist 11/06/2018 2:12 AM

## 2018-11-06 NOTE — Progress Notes (Addendum)
Visit made. Patient is currently followed by [place] at home with a hospice diagnosis of prostate cancer. He is a DNR with out of facility DNR in place. Patient was sent to the [facility] ED overnight for evaluation following a  fall at home. Per chart note review patient was transferring himself from the commode back to his wheel chair when he fell forward. He has been admitted for treatment of a UTI. He received a PRN dose of IV morphine for abdominal pain while in the ED. Home medications confirmed with his home hospice nurse. Please note patient has a chronic indwelling foley that was changed on 6/19. Staff RN Olam Idler made aware. Patient seen just arriving into his room from the ED, he is alert and requesting to sit in the recliner. No current needs. Will continue to follow and update hospice team. Hospital care team aware of patient's current hospice status. Flo Shanks BSN, RN, [facility] hospice [phone]

## 2018-11-06 NOTE — Progress Notes (Signed)
Patient admitted this morning. Patient here with UTI and possible pneumonia.  Continue aztreonam  Agree with admitting MD plan.

## 2018-11-06 NOTE — ED Notes (Signed)
Pt resting comfortably, awaiting bed assignment

## 2018-11-07 LAB — CBC
HCT: 30.2 % — ABNORMAL LOW (ref 39.0–52.0)
Hemoglobin: 9.5 g/dL — ABNORMAL LOW (ref 13.0–17.0)
MCH: 31.5 pg (ref 26.0–34.0)
MCHC: 31.5 g/dL (ref 30.0–36.0)
MCV: 100 fL (ref 80.0–100.0)
Platelets: 201 10*3/uL (ref 150–400)
RBC: 3.02 MIL/uL — ABNORMAL LOW (ref 4.22–5.81)
RDW: 14.6 % (ref 11.5–15.5)
WBC: 5.7 10*3/uL (ref 4.0–10.5)
nRBC: 0 % (ref 0.0–0.2)

## 2018-11-07 LAB — BASIC METABOLIC PANEL
Anion gap: 6 (ref 5–15)
BUN: 31 mg/dL — ABNORMAL HIGH (ref 8–23)
CO2: 22 mmol/L (ref 22–32)
Calcium: 7.9 mg/dL — ABNORMAL LOW (ref 8.9–10.3)
Chloride: 107 mmol/L (ref 98–111)
Creatinine, Ser: 1.05 mg/dL (ref 0.61–1.24)
GFR calc Af Amer: >60 mL/min (ref >60)
GFR calc non Af Amer: >60 mL/min (ref >60)
Glucose, Bld: 93 mg/dL (ref 70–99)
Potassium: 4.8 mmol/L (ref 3.5–5.1)
Sodium: 135 mmol/L (ref 135–145)

## 2018-11-07 MED ORDER — ENOXAPARIN SODIUM 40 MG/0.4ML ~~LOC~~ SOLN
40.0000 mg | SUBCUTANEOUS | Status: DC
  Administered 2018-11-07: 40 mg via SUBCUTANEOUS
  Filled 2018-11-07: qty 0.4

## 2018-11-07 MED ORDER — SODIUM CHLORIDE 0.9 % IV SOLN
1.0000 g | Freq: Two times a day (BID) | INTRAVENOUS | Status: DC
  Administered 2018-11-07 – 2018-11-08 (×2): 1 g via INTRAVENOUS
  Filled 2018-11-07 (×4): qty 1

## 2018-11-07 NOTE — Progress Notes (Signed)
[facility] [place] NAME: Theodore Padilla    MR#:  [id number]  DATE OF BIRTH:  12/09/1921  SUBJECTIVE:   Patient doing well.  Denies dysuria, frequency urgency.  REVIEW OF SYSTEMS:    Review of Systems  Constitutional: Negative for fever, chills weight loss HENT: Negative for ear pain, nosebleeds, congestion, facial swelling, rhinorrhea, neck pain, neck stiffness and ear discharge.   Respiratory: Negative for cough, shortness of breath, wheezing  Cardiovascular: Negative for chest pain, palpitations and leg swelling.  Gastrointestinal: Negative for heartburn, abdominal pain, vomiting, diarrhea or consitpation Genitourinary: Negative for dysuria, urgency, frequency, hematuria Musculoskeletal: Negative for back pain or joint pain Neurological: Negative for dizziness, seizures, syncope, focal weakness,  numbness and headaches.  Hematological: Does not bruise/bleed easily.  Psychiatric/Behavioral: Negative for hallucinations, confusion, dysphoric mood    Tolerating Diet: yes      DRUG ALLERGIES:   Allergies  Allergen Reactions  . Ciprofloxacin Other (See Comments) and Shortness Of Breath    Low heart rate, bleeding  . Penicillins Anaphylaxis and Swelling    Has patient had a PCN reaction causing immediate rash, facial/tongue/throat swelling, SOB or lightheadedness with hypotension: Yes Has patient had a PCN reaction causing severe rash involving mucus membranes or skin necrosis: No Has patient had a PCN reaction that required hospitalization: No Has patient had a PCN reaction occurring within the last 10 years: No If all of the above answers are "NO", then may proceed with Cephalosporin use.   Marland Kitchen Penicillins   . Allopurinol Other (See Comments)    Bloody stools   . Pantoprazole Nausea Only  . Rofecoxib Rash  . Tramadol Nausea And Vomiting    VITALS:  Blood pressure (!) 152/59, pulse 76, temperature 98.8 F (37.1 C),  temperature source Oral, resp. rate 15, height 5\' 6"  (1.676 m), weight 83.9 kg, SpO2 96 %.  PHYSICAL EXAMINATION:  Constitutional: Appears well-developed and well-nourished. No distress. HENT: Normocephalic. Marland Kitchen Oropharynx is clear and moist.  Eyes: Conjunctivae and EOM are normal. PERRLA, no scleral icterus.  Neck: Normal ROM. Neck supple. No JVD. No tracheal deviation. CVS: RRR, S1/S2 +, no murmurs, no gallops, no carotid bruit.  Pulmonary: Effort and breath sounds normal, no stridor, rhonchi, wheezes, rales.  Abdominal: Soft. BS +,  no distension, tenderness, rebound or guarding.  Musculoskeletal: Normal range of motion. No edema and no tenderness.  Neuro: Alert. CN 2-12 grossly intact. No focal deficits. Skin: Skin is warm and dry. No rash noted. Psychiatric: Normal mood and affect.      LABORATORY PANEL:   CBC Recent Labs  Lab 11/07/18 0412  WBC 5.7  HGB 9.5*  HCT 30.2*  PLT 201   ------------------------------------------------------------------------------------------------------------------  Chemistries  Recent Labs  Lab 11/05/18 2312  11/07/18 0412  NA 133*   < > 135  K 3.4*   < > 4.8  CL 101   < > 107  CO2 20*   < > 22  GLUCOSE 163*   < > 93  BUN 33*   < > 31*  CREATININE 1.85*   < > 1.05  CALCIUM 8.6*   < > 7.9*  MG 2.3  --   --   AST 30  --   --   ALT 17  --   --   ALKPHOS 56  --   --   BILITOT 0.4  --   --    < > = values in this interval not displayed.   ------------------------------------------------------------------------------------------------------------------  Cardiac Enzymes No results for input(s): TROPONINI in the last 168 hours. ------------------------------------------------------------------------------------------------------------------  RADIOLOGY:  Ct Chest Wo Contrast  Result Date: 11/06/2018 CLINICAL DATA:  Dyspnea EXAM: CT CHEST WITHOUT CONTRAST TECHNIQUE: Multidetector CT imaging of the chest was performed following the  standard protocol without IV contrast. COMPARISON:  CT dated September 11, 2013. FINDINGS: Cardiovascular: Extensive atherosclerotic calcifications are noted of the thoracic aorta. The main pulmonary artery is dilated measuring approximately 3.6 cm in diameter. The heart size is normal. Aortic calcifications are noted. Mediastinum/Nodes: There are prominent but subcentimeter mediastinal and hilar lymph nodes bilaterally. There are no pathologically enlarged axillary lymph nodes. Extensive thyroid nodules are again noted but these are essentially stable from prior study. Lungs/Pleura: There is an essentially stable opacity in the lingula measuring approximately 3.1 cm. There is a new opacity in the superior left lower lobe measuring approximately 3.5 cm. The trachea is unremarkable. There is no pneumothorax. No large pleural effusion. There are few additional scattered smaller pulmonary opacities bilaterally including a 1.3 cm opacity in the left upper lobe (axial series 3, image 57). Upper Abdomen: There is a new 2.4 cm hypoattenuating mass that appears to be located within hepatic segment 8/4 a. There is likely cholelithiasis without CT evidence of acute cholecystitis. The common bile duct appears dilated but this is similar to prior CT. Musculoskeletal: There is no definite acute osseous abnormality. There is an irregularity of the posterior third rib on the left which has progressed slightly from prior study. This appears chronic but is of unknown clinical significance. IMPRESSION: 1. New pulmonary nodules as detailed above measuring up to approximately 3.5 cm. In combination with a new 2.4 cm hypoattenuating liver masses concerning for metastatic disease. 2. Dilated main pulmonary artery which can be seen in patients with elevated pulmonary artery pressures. 3. Additional chronic findings as above. Aortic Atherosclerosis (ICD10-I70.0). Electronically Signed   By: Constance Holster M.D.   On: 11/06/2018 02:46   Dg  Chest Port 1 View  Result Date: 11/06/2018 CLINICAL DATA:  85 year old male with sepsis. EXAM: PORTABLE CHEST 1 VIEW COMPARISON:  Chest radiograph dated 07/10/2016 FINDINGS: Confluent areas of density at the left lung base concerning for developing infiltrate. Clinical correlation is recommended. There is no pleural effusion or pneumothorax. Stable cardiac silhouette. Atherosclerotic calcification of the aorta. No acute osseous pathology. IMPRESSION: Left lung base density concerning for developing infiltrate. Electronically Signed   By: Anner Crete M.D.   On: 11/06/2018 00:44     ASSESSMENT AND PLAN:   85 year old male with metastatic prostate cancer who is on hospice at home presented to the emergency room with generalized weakness and found to have urinary tract infection.  1.  Sepsis: Patient presents with tachypnea and hypotension.  Sepsis due to UTI along with possible left lower lung infiltrate. Sepsis is improved.  2.  Urinary tract infection: Urine culture shows that it needs to be reintubated.  I called lab this morning and hopefully will have sensitivities by tomorrow. Continue aztreonam 3.  Community-acquired pneumonia, left lower lobe:  Continue aztreonam  4.  Mild hyponatremia from pneumonia and dehydration: This is improved with IV fluids.  5.  Anemia of chronic disease with stable hemoglobin   Management plans discussed with the patient and he is in agreement.  CODE STATUS: DNR  TOTAL TIME TAKING CARE OF THIS PATIENT: 22 minutes.     POSSIBLE D/C tomorrow home with hospice, DEPENDING ON CLINICAL CONDITION.   Bettey Costa M.D on 11/07/2018 at 9:52  AM  Between 7am to 6pm - Pager - [phone] After 6pm go to [email or web address] - password [email or web address] [facility]  Office  [phone]  CC: Primary care physician; Baxter Hire, MD  Note: This dictation was prepared with Dragon dictation along with smaller phrase technology. Any transcriptional  errors that result from this process are unintentional.

## 2018-11-07 NOTE — Progress Notes (Signed)
PHARMACIST - PHYSICIAN COMMUNICATION  CONCERNING:  Enoxaparin (Lovenox) for DVT Prophylaxis    RECOMMENDATION: Patient was prescribed enoxaprin 30mg  q24 hours for VTE prophylaxis due to CrCl <30 ml/min.   Renal function has now improved.   Filed Weights   11/05/18 2307 11/06/18 0225  Weight: 180 lb (81.6 kg) 185 lb (83.9 kg)    Body mass index is 29.86 kg/m.  Estimated Creatinine Clearance: 43.7 mL/min (by C-G formula based on SCr of 1.05 mg/dL).   Based on Theodore Padilla patient is candidate for enoxaparin 40mg  every 24 hour dosing for CrCl >30 ml/min   DESCRIPTION: Pharmacy has adjusted enoxaparin dose per [facility] policy.  Patient is now receiving enoxaparin 40mg  every 24 hours.   Pernell Dupre, PharmD, BCPS Clinical Pharmacist 11/07/2018 6:17 AM

## 2018-11-07 NOTE — Progress Notes (Signed)
2.04 second pause on heart monitor. MD Marcille Blanco notified, no new orders. Stacie Glaze, RN

## 2018-11-07 NOTE — Progress Notes (Signed)
Visit made. Patient seen sitting up in bed, repostioned for lunch tray with staff RN Cristie Hem. Patient reports feeling better today. He has received IV antibiotics and PRN dose of Tylenol this morning. Appetite good. Writer spoke with patient's  hospice social worker Clenton Pare, patient's niece is arranging more care in the home for him as he lives alone and is getting weaker. Plan is for patient to discharge to the hospice home tomorrow while care is being set up. Patient voiced his understanding and agreement. Hospital care team updated. Will continue to follow.  Flo Shanks BSN, RN, [facility]  [phone]

## 2018-11-07 NOTE — Consult Note (Signed)
Pharmacy Antibiotic Note  Theodore Padilla is a 85 y.o. male admitted on 11/05/2018 with UTI.  Pharmacy has been consulted for Aztreonam dosing. Patient has a reported anaphylaxis reaction to PCN.   Plan: Renal function has improved. Will adjust dose to aztreonam 1g IV every 12 hours  Height: 5\' 6"  (167.6 cm) Weight: 185 lb (83.9 kg) IBW/kg (Calculated) : 63.8  Temp (24hrs), Avg:98.6 F (37 C), Min:98.3 F (36.8 C), Max:98.8 F (37.1 C)  Recent Labs  Lab 11/05/18 2312 11/06/18 0029 11/06/18 0455 11/06/18 0530 11/06/18 1756 11/07/18 0412  WBC 11.0*  --  9.6 10.3  --  5.7  CREATININE 1.85*  --  TEST WILL BE CREDITED 1.60*  --  1.05  LATICACIDVEN  --  1.3  --   --  1.4  --     Estimated Creatinine Clearance: 43.7 mL/min (by C-G formula based on SCr of 1.05 mg/dL).    Allergies  Allergen Reactions  . Ciprofloxacin Other (See Comments) and Shortness Of Breath    Low heart rate, bleeding  . Penicillins Anaphylaxis and Swelling    Has patient had a PCN reaction causing immediate rash, facial/tongue/throat swelling, SOB or lightheadedness with hypotension: Yes Has patient had a PCN reaction causing severe rash involving mucus membranes or skin necrosis: No Has patient had a PCN reaction that required hospitalization: No Has patient had a PCN reaction occurring within the last 10 years: No If all of the above answers are "NO", then may proceed with Cephalosporin use.   Marland Kitchen Penicillins   . Allopurinol Other (See Comments)    Bloody stools   . Pantoprazole Nausea Only  . Rofecoxib Rash  . Tramadol Nausea And Vomiting    Antimicrobials this admission: 6/24 aztreonam  >>   Microbiology results: 6/24 BCx: pending 6/24 UCx: pending  Thank you for allowing pharmacy to be a part of this patient's care.  Pernell Dupre, PharmD, BCPS Clinical Pharmacist 11/07/2018 6:18 AM

## 2018-11-08 LAB — URINE CULTURE: Culture: >=100000 — AB

## 2018-11-08 MED ORDER — SULFAMETHOXAZOLE-TRIMETHOPRIM 800-160 MG PO TABS: 1.0000 | ORAL_TABLET | Freq: Two times a day (BID) | ORAL | 0 refills | Status: AC

## 2018-11-08 MED ORDER — MORPHINE SULFATE (CONCENTRATE) 10 MG /0.5 ML PO SOLN: 5.0000 mg | ORAL | Status: AC | PRN

## 2018-11-08 MED ORDER — SULFAMETHOXAZOLE-TRIMETHOPRIM 800-160 MG PO TABS
1.0000 | ORAL_TABLET | Freq: Two times a day (BID) | ORAL | Status: DC
  Administered 2018-11-08: 12:00:00 1 via ORAL
  Filled 2018-11-08 (×2): qty 1

## 2018-11-08 MED ORDER — FOSFOMYCIN TROMETHAMINE 3 G PO PACK
3.0000 g | PACK | Freq: Once | ORAL | Status: AC
  Administered 2018-11-08: 3 g via ORAL
  Filled 2018-11-08: qty 3

## 2018-11-08 NOTE — Discharge Summary (Signed)
[facility] [place] NAME: Theodore Padilla    MR#:  [id number]  DATE OF BIRTH:  08-15-1921  DATE OF ADMISSION:  11/05/2018 ADMITTING PHYSICIAN: [name], MD  DATE OF DISCHARGE: 11/08/2018  PRIMARY CARE PHYSICIAN: Baxter Hire, MD    ADMISSION DIAGNOSIS:  Dehydration [E86.0] Elevated troponin level [R79.89] Near syncope [R55] Acute kidney injury ([place]) [N17.9] Duodenal ulcer [K26.9] Constipation, unspecified constipation type [K59.00] Urinary tract infection associated with indwelling urethral catheter, initial encounter ([place]) [K93.818E, N39.0] Sepsis with acute renal failure without septic shock, due to unspecified organism, unspecified acute renal failure type ([place]) [A41.9, R65.20, N17.9]  DISCHARGE DIAGNOSIS:  Active Problems:   Sepsis ([place])   SECONDARY DIAGNOSIS:   Past Medical History:  Diagnosis Date  . Arthritis   . B12 deficiency   . BPH (benign prostatic hyperplasia)   . Chronic constipation   . Coronary artery disease    with stenting x 1  . Degeneration of spine   . Gout   . Hearing aid worn    bilateral  . History of heart artery stent 2002  . History of kidney stones   . HLD (hyperlipidemia)   . Hypertension   . Kidney stones   . Lower extremity edema 2019  . Moderate mitral insufficiency   . Moderate tricuspid insufficiency   . Osteoporosis   . Prostate cancer ([place])   . Rectal tumor   . Shortness of breath    occasional  . Sleep apnea    no CPAP, pt denies OSA  . Spine deformity 2019   has severe back pain. previously had surgery without success for improvement  . Urinary hesitancy   . Urinary retention   . Wears dentures    partial upper and lower    HOSPITAL COURSE:   85 year old male with metastatic prostate cancer who is on hospice at home presented to the emergency room with generalized weakness and found to have urinary tract infection.  1.  Sepsis: Patient presentedwith tachypnea and  hypotension.  Sepsis was due to Enterococcus faecalis and STENOTROPHOMONAS MALTOPHILIA  Abnormal UTI along with left lower lung infiltrate. Sepsis is resolved.  2.  Urinary tract infection due to above bacteria associated with chronic Foley catheter: Patient initiated on aztreonam on admission.  He was discharged on oral Bactrim as per sensitivities.  He was given 1 dose of past fosfomycin for the enterococcus prior to discharge.   3.  Community-acquired pneumonia, left lower lobe: Patient will continue on Bactrim  4.  Mild hyponatremia from pneumonia and dehydration: This has improved with IV fluids.  5.  Anemia of chronic disease with stable hemoglobin   DISCHARGE CONDITIONS AND DIET:  Stable for discharge to hospice for respite care  CONSULTS OBTAINED:    DRUG ALLERGIES:   Allergies  Allergen Reactions  . Ciprofloxacin Other (See Comments) and Shortness Of Breath    Low heart rate, bleeding  . Penicillins Anaphylaxis and Swelling    Has patient had a PCN reaction causing immediate rash, facial/tongue/throat swelling, SOB or lightheadedness with hypotension: Yes Has patient had a PCN reaction causing severe rash involving mucus membranes or skin necrosis: No Has patient had a PCN reaction that required hospitalization: No Has patient had a PCN reaction occurring within the last 10 years: No If all of the above answers are "NO", then may proceed with Cephalosporin use.   Marland Kitchen Penicillins   . Allopurinol Other (See Comments)    Bloody  stools   . Pantoprazole Nausea Only  . Rofecoxib Rash  . Tramadol Nausea And Vomiting    DISCHARGE MEDICATIONS:   Allergies as of 11/08/2018      Reactions   Ciprofloxacin Other (See Comments), Shortness Of Breath   Low heart rate, bleeding   Penicillins Anaphylaxis, Swelling   Has patient had a PCN reaction causing immediate rash, facial/tongue/throat swelling, SOB or lightheadedness with hypotension: Yes Has patient had a PCN reaction  causing severe rash involving mucus membranes or skin necrosis: No Has patient had a PCN reaction that required hospitalization: No Has patient had a PCN reaction occurring within the last 10 years: No If all of the above answers are "NO", then may proceed with Cephalosporin use.   Penicillins    Allopurinol Other (See Comments)   Bloody stools    Pantoprazole Nausea Only   Rofecoxib Rash   Tramadol Nausea And Vomiting      Medication List    STOP taking these medications   acetaminophen-codeine 300-30 MG tablet Commonly known as: TYLENOL #3   dexamethasone 4 MG tablet Commonly known as: DECADRON   hydrochlorothiazide 12.5 MG tablet Commonly known as: HYDRODIURIL     TAKE these medications   acetaminophen 500 MG tablet Commonly known as: TYLENOL Take 500 mg by mouth See admin instructions. Take 1 tablet (500mg ) by mouth twice daily as scheduled and 1-2 additional tablets (500mg -1000mg ) daily as needed for pain   Cepacol Regular Strength 3 MG lozenge Generic drug: menthol-cetylpyridinium Take 1 lozenge by mouth as needed for sore throat.   [employer] Allergy 25 mg capsule Generic drug: diphenhydrAMINE Take 25 mg by mouth at bedtime as needed for allergies or sleep.   [facility]S Bisacodyl 5 MG EC tablet Generic drug: bisacodyl TAKE 2-4 TABLETS BY MOUTH DAILY What changed: See the new instructions.   lactulose 10 GM/15ML solution Commonly known as: CHRONULAC Take 30 mLs (20 g total) by mouth 2 (two) times daily as needed for mild constipation.   LORazepam 0.5 MG tablet Commonly known as: ATIVAN Take 0.5 mg by mouth See admin instructions. Take 1 tablet (0.5mg ) by mouth every 2 hours as needed for anxiety and 1 tablet (0.5mg ) by mouth every night at bedtime for sleep   magnesium citrate Soln Take 0.5 Bottles by mouth as directed. For constipation not relieved by other therapies   magnesium hydroxide 800 MG/5ML suspension Commonly known as: MILK OF MAGNESIA Take 30 mLs by mouth  daily as needed for constipation.   morphine CONCENTRATE 10 mg / 0.5 ml concentrated solution Take 0.25 mLs (5 mg total) by mouth every 2 (two) hours as needed for moderate pain, severe pain or shortness of breath.   multivitamin with minerals Tabs tablet Take 1 tablet by mouth daily.   ondansetron 8 MG tablet Commonly known as: ZOFRAN Take 8 mg by mouth every 8 (eight) hours as needed for nausea or vomiting.   oxybutynin 5 MG tablet Commonly known as: DITROPAN Take 1 tablet by mouth every 8 (eight) hours as needed for bladder spasms.   sulfamethoxazole-trimethoprim 800-160 MG tablet Commonly known as: BACTRIM DS Take 1 tablet by mouth every 12 (twelve) hours for 6 days. What changed: when to take this   TART CHERRY ADVANCED PO Take 1,200 mg by mouth daily.   vitamin B-12 1000 MCG tablet Commonly known as: CYANOCOBALAMIN Take 1,000 mcg by mouth daily.         Today   CHIEF COMPLAINT:   Doing well this morning.  No acute issues overnight   VITAL SIGNS:  Blood pressure (!) 136/109, pulse 73, temperature 98.7 F (37.1 C), temperature source Oral, resp. rate 20, height 5\' 6"  (1.676 m), weight 83.9 kg, SpO2 97 %.   REVIEW OF SYSTEMS:  Review of Systems  Constitutional: Positive for malaise/fatigue. Negative for chills and fever.  HENT: Negative.  Negative for ear discharge, ear pain, hearing loss, nosebleeds and sore throat.   Eyes: Negative.  Negative for blurred vision and pain.  Respiratory: Negative.  Negative for cough, hemoptysis, shortness of breath and wheezing.   Cardiovascular: Negative.  Negative for chest pain, palpitations and leg swelling.  Gastrointestinal: Negative.  Negative for abdominal pain, blood in stool, diarrhea, nausea and vomiting.  Genitourinary: Negative.  Negative for dysuria.  Musculoskeletal: Negative.  Negative for back pain.  Skin: Negative.   Neurological: Negative for dizziness, tremors, speech change, focal weakness, seizures and  headaches.  Endo/Heme/Allergies: Negative.  Does not bruise/bleed easily.  Psychiatric/Behavioral: Negative.  Negative for depression, hallucinations and suicidal ideas.     PHYSICAL EXAMINATION:  GENERAL:  85 y.o.-year-old patient lying in the bed with no acute distress.  NECK:  Supple, no jugular venous distention. No thyroid enlargement, no tenderness.  LUNGS: Normal breath sounds bilaterally, no wheezing, rales,rhonchi  No use of accessory muscles of respiration.  CARDIOVASCULAR: S1, S2 normal. No murmurs, rubs, or gallops.  ABDOMEN: Soft, non-tender, non-distended. Bowel sounds present. No organomegaly or mass.  EXTREMITIES: No pedal edema, cyanosis, or clubbing.  PSYCHIATRIC: The patient is alert and oriented x 3.  SKIN: No obvious rash, lesion, or ulcer.   DATA REVIEW:   CBC Recent Labs  Lab 11/07/18 0412  WBC 5.7  HGB 9.5*  HCT 30.2*  PLT 201    Chemistries  Recent Labs  Lab 11/05/18 2312  11/07/18 0412  NA 133*   < > 135  K 3.4*   < > 4.8  CL 101   < > 107  CO2 20*   < > 22  GLUCOSE 163*   < > 93  BUN 33*   < > 31*  CREATININE 1.85*   < > 1.05  CALCIUM 8.6*   < > 7.9*  MG 2.3  --   --   AST 30  --   --   ALT 17  --   --   ALKPHOS 56  --   --   BILITOT 0.4  --   --    < > = values in this interval not displayed.    Cardiac Enzymes No results for input(s): TROPONINI in the last 168 hours.  Microbiology Results  @MICRORSLT48 @  RADIOLOGY:  No results found.    Allergies as of 11/08/2018      Reactions   Ciprofloxacin Other (See Comments), Shortness Of Breath   Low heart rate, bleeding   Penicillins Anaphylaxis, Swelling   Has patient had a PCN reaction causing immediate rash, facial/tongue/throat swelling, SOB or lightheadedness with hypotension: Yes Has patient had a PCN reaction causing severe rash involving mucus membranes or skin necrosis: No Has patient had a PCN reaction that required hospitalization: No Has patient had a PCN reaction  occurring within the last 10 years: No If all of the above answers are "NO", then may proceed with Cephalosporin use.   Penicillins    Allopurinol Other (See Comments)   Bloody stools    Pantoprazole Nausea Only   Rofecoxib Rash   Tramadol Nausea And Vomiting  Medication List    STOP taking these medications   acetaminophen-codeine 300-30 MG tablet Commonly known as: TYLENOL #3   dexamethasone 4 MG tablet Commonly known as: DECADRON   hydrochlorothiazide 12.5 MG tablet Commonly known as: HYDRODIURIL     TAKE these medications   acetaminophen 500 MG tablet Commonly known as: TYLENOL Take 500 mg by mouth See admin instructions. Take 1 tablet (500mg ) by mouth twice daily as scheduled and 1-2 additional tablets (500mg -1000mg ) daily as needed for pain   Cepacol Regular Strength 3 MG lozenge Generic drug: menthol-cetylpyridinium Take 1 lozenge by mouth as needed for sore throat.   [employer] Allergy 25 mg capsule Generic drug: diphenhydrAMINE Take 25 mg by mouth at bedtime as needed for allergies or sleep.   [facility]S Bisacodyl 5 MG EC tablet Generic drug: bisacodyl TAKE 2-4 TABLETS BY MOUTH DAILY What changed: See the new instructions.   lactulose 10 GM/15ML solution Commonly known as: CHRONULAC Take 30 mLs (20 g total) by mouth 2 (two) times daily as needed for mild constipation.   LORazepam 0.5 MG tablet Commonly known as: ATIVAN Take 0.5 mg by mouth See admin instructions. Take 1 tablet (0.5mg ) by mouth every 2 hours as needed for anxiety and 1 tablet (0.5mg ) by mouth every night at bedtime for sleep   magnesium citrate Soln Take 0.5 Bottles by mouth as directed. For constipation not relieved by other therapies   magnesium hydroxide 800 MG/5ML suspension Commonly known as: MILK OF MAGNESIA Take 30 mLs by mouth daily as needed for constipation.   morphine CONCENTRATE 10 mg / 0.5 ml concentrated solution Take 0.25 mLs (5 mg total) by mouth every 2 (two) hours as needed for  moderate pain, severe pain or shortness of breath.   multivitamin with minerals Tabs tablet Take 1 tablet by mouth daily.   ondansetron 8 MG tablet Commonly known as: ZOFRAN Take 8 mg by mouth every 8 (eight) hours as needed for nausea or vomiting.   oxybutynin 5 MG tablet Commonly known as: DITROPAN Take 1 tablet by mouth every 8 (eight) hours as needed for bladder spasms.   sulfamethoxazole-trimethoprim 800-160 MG tablet Commonly known as: BACTRIM DS Take 1 tablet by mouth every 12 (twelve) hours for 6 days. What changed: when to take this   TART CHERRY ADVANCED PO Take 1,200 mg by mouth daily.   vitamin B-12 1000 MCG tablet Commonly known as: CYANOCOBALAMIN Take 1,000 mcg by mouth daily.           Management plans discussed with the patient and he is in agreement. Stable for discharge   Patient should follow up with hospice  CODE STATUS:     Code Status Orders  (From admission, onward)         Start     Ordered   11/06/18 0345  Do not attempt resuscitation (DNR)  Continuous    Question Answer Comment  In the event of cardiac or respiratory ARREST Do not call a "code blue"   In the event of cardiac or respiratory ARREST Do not perform Intubation, CPR, defibrillation or ACLS   In the event of cardiac or respiratory ARREST Use medication by any route, position, wound care, and other measures to relive pain and suffering. May use oxygen, suction and manual treatment of airway obstruction as needed for comfort.      11/06/18 0348        Code Status History    Date Active Date Inactive Code Status Order ID Comments User  Context   11/06/2018 0020 11/06/2018 0348 DNR [id number]  Hinda Kehr, MD ED   07/10/2016 1814 07/12/2016 2008 Full Code [id number]  Saundra Shelling, MD Inpatient   Advance Care Planning Activity    Advance Directive Documentation     Most Recent Value  Type of Advance Directive  Out of facility DNR (pink MOST or yellow form)  Pre-existing out  of facility DNR order (yellow form or pink MOST form)  Yellow form placed in chart (order not valid for inpatient use)  "MOST" Form in Place?  -      TOTAL TIME TAKING CARE OF THIS PATIENT: 38 minutes.    Note: This dictation was prepared with Dragon dictation along with smaller phrase technology. Any transcriptional errors that result from this process are unintentional.  Bettey Costa M.D on 11/08/2018 at 10:43 AM  Between 7am to 6pm - Pager - [phone] After 6pm go to [email or web address] - password [email or web address]  Office  [phone]  CC: Primary care physician; Baxter Hire, MD

## 2018-11-08 NOTE — Progress Notes (Signed)
   11/08/18 1100  Clinical Encounter Type  Visited With Patient  Visit Type Initial  Stress Factors  Patient Stress Factors Health changes  Ch was rounding. Pt said he was to be moved to [facility] by noon and was waiting for him. Pt expressed discomfort and pain. Ch left the pt for him to get needed rest. The visit was appreciated.

## 2018-11-08 NOTE — Progress Notes (Signed)
When hanging patient antibiotic, patient complained of not being able to breath. Took patient mask off, patient stated that helped a little. Lung sounds clear. Oxygen level 96%. Offered oxygen for comfort. Patient declined. Patient complained of pain in lungs, states "from the cancer there." When offered pain medication, he declined. Told this nurse, he just wanted to "go to sleep and not wake up." Suggested chaplain come and talk to him about his feelings, patient declined. Offered verbal encouragement and sat with patient.  Will continue to monitor.  Stacie Glaze, RN

## 2018-11-08 NOTE — Progress Notes (Signed)
Visit made. Patient seen sitting up eating lunch, repositioned with assistance of staff aide Christine for better upright eating position. Patient had complained earlier about having difficulty with his breakfast.  He has, per previous report from his hospice team, had difficulty swallowing over the past few weeks. Per chart note review patient complained of chest pain last night, but declined any pain medications. He reports taking Tylenol at home and also liquid morphine PRN. Writer educated him on availability of pain medication here while hospitalized. Patient is becoming more forgetful and is weaker than when seen previously in the ED earlier in the month. Plan continues for patient to discharge to the hospice home today while care is set up at home. Report called to the hospice home EMS notified for pick up at 2 pm. Discharge summary faxed to referral. Signed DNR in place in the discharge packet.  Hospice and Hospital team updated. Flo Shanks BSN, RN, [facility] [phone]

## 2018-11-11 LAB — CULTURE, BLOOD (ROUTINE X 2)
Culture: NO GROWTH
Culture: NO GROWTH
Special Requests: ADEQUATE
Special Requests: ADEQUATE

## 2018-11-26 ENCOUNTER — Other Ambulatory Visit: Payer: Self-pay | Admitting: *Deleted

## 2018-11-26 NOTE — Telephone Encounter (Signed)
Received a request to refill his Flurouricil cream. Discussd with Vickki Muff, RN who informed me that patient is in hospice home and we will not refill this medicine  for patient. Pharmacy informed

## 2018-12-31 ENCOUNTER — Other Ambulatory Visit: Payer: Self-pay | Admitting: Internal Medicine

## 2019-02-13 ENCOUNTER — Encounter: Payer: Self-pay | Admitting: Internal Medicine

## 2019-02-13 DEATH — deceased

## 2020-06-26 IMAGING — DX PORTABLE CHEST - 1 VIEW
1 series · 1 of 1 positions shown · non-contrast
Comparison: Chest radiograph dated 07/10/2016

CLINICAL DATA: [AGE] male with sepsis.

EXAM:
PORTABLE CHEST 1 VIEW

[chest ap]
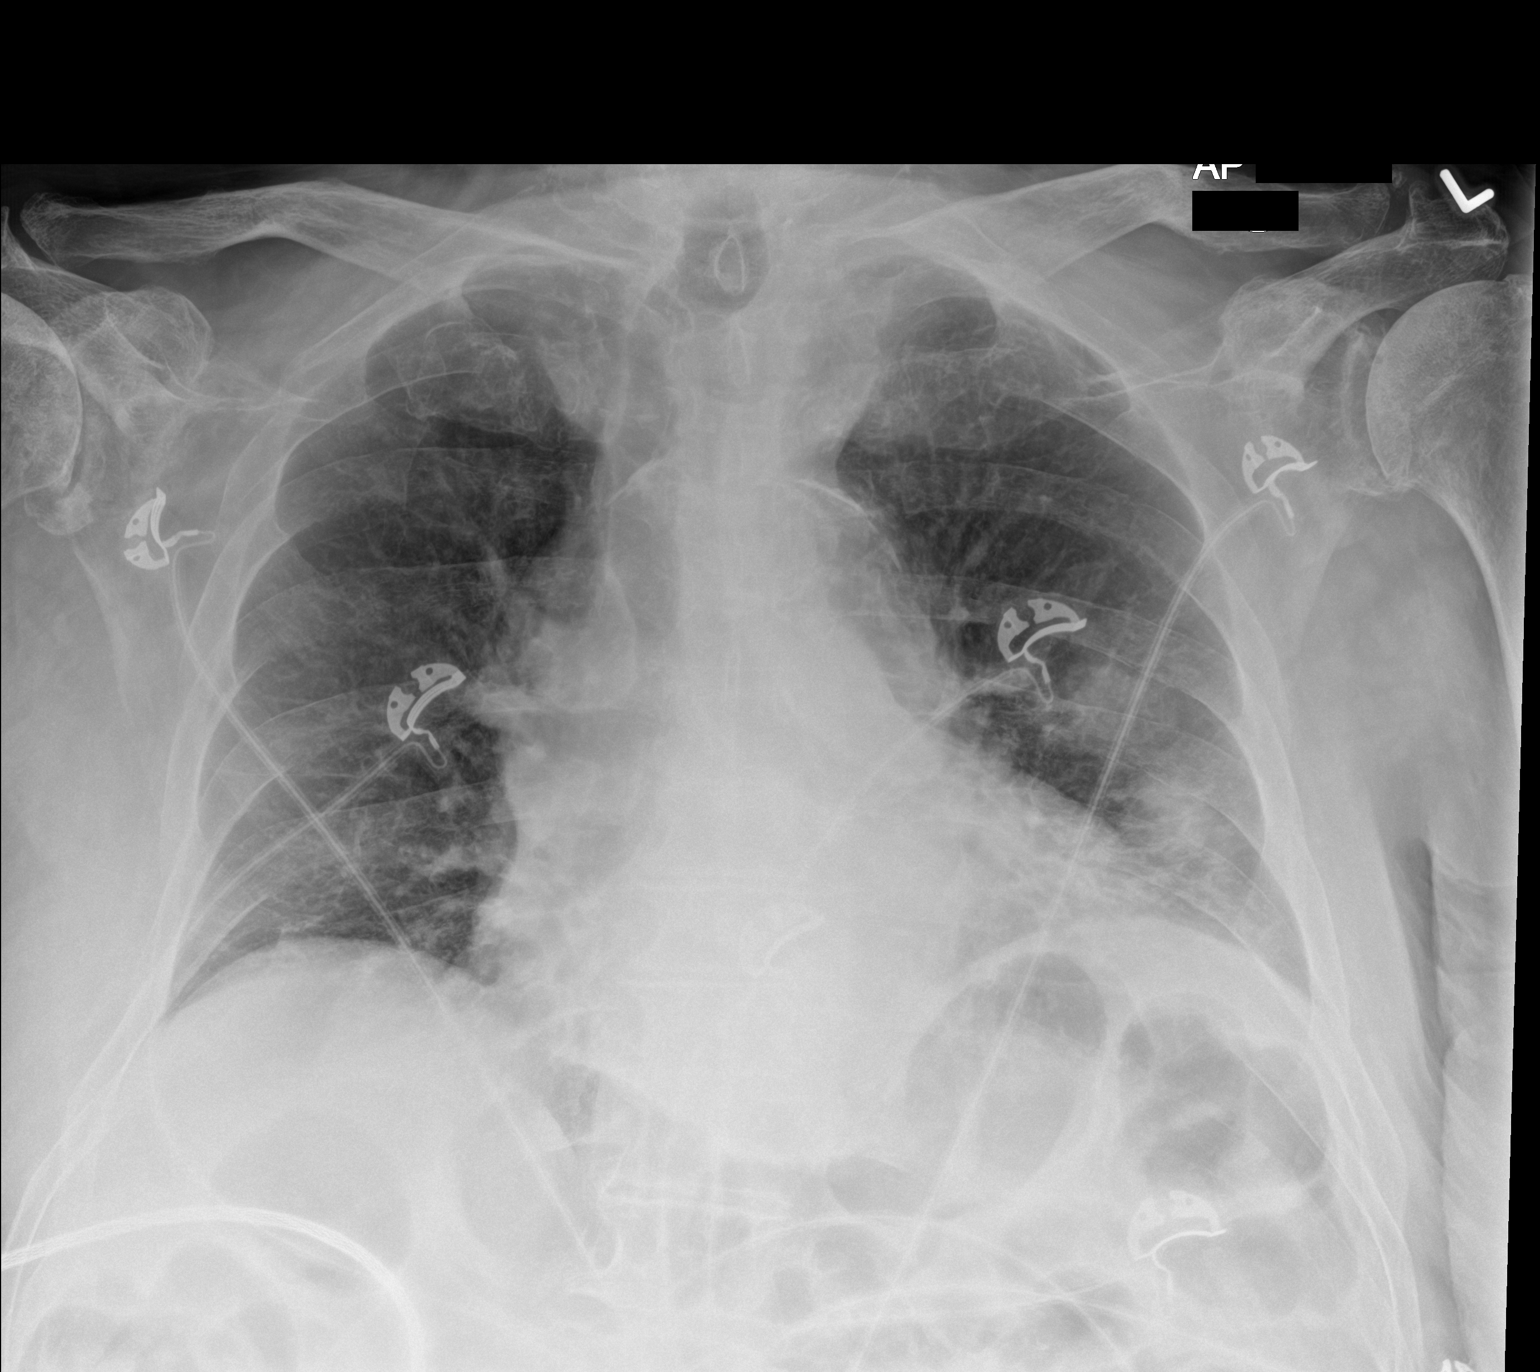

[1 of 1 positions shown; findings below may reference images not displayed]

FINDINGS: Confluent areas of density at the left lung base concerning for
developing infiltrate. Clinical correlation is recommended. There is
no pleural effusion or pneumothorax. Stable cardiac silhouette.
Atherosclerotic calcification of the aorta. No acute osseous
pathology.
IMPRESSION: Left lung base density concerning for developing infiltrate.

## 2020-06-26 IMAGING — CT CT CHEST WITHOUT CONTRAST
2 of 3 series · 15 of 36 positions shown, 18 images · non-contrast
Comparison: CT dated September 11, 2013.

CLINICAL DATA: Dyspnea

EXAM:
CT CHEST WITHOUT CONTRAST
TECHNIQUE: Multidetector CT imaging of the chest was performed following the
standard protocol without IV contrast.

[Series 2: thorax · axial · 0.82mm/px · z∈[+976,+1216]mm · 12 of 142 slices shown, 15 images]
[im 11/142  mediastinal]
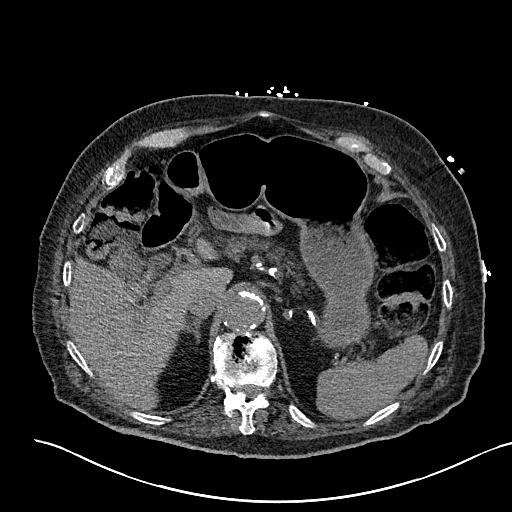
[im 11/142  lung]
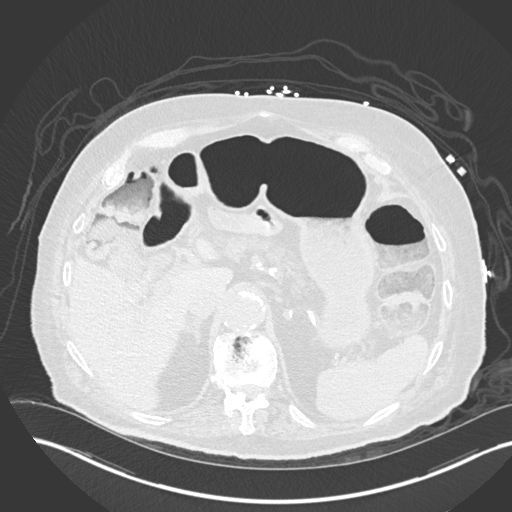
[im 21/142  lung]
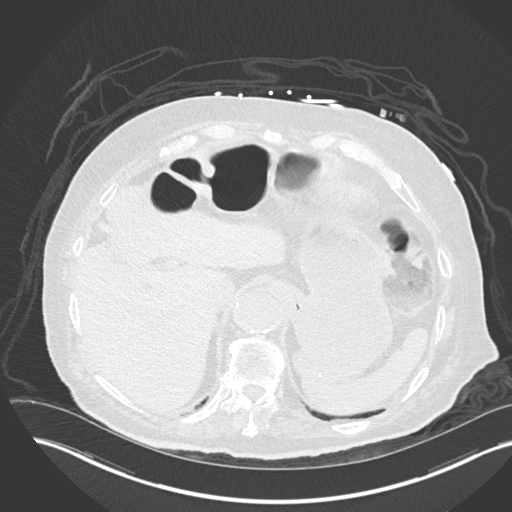
[im 32/142  lung]
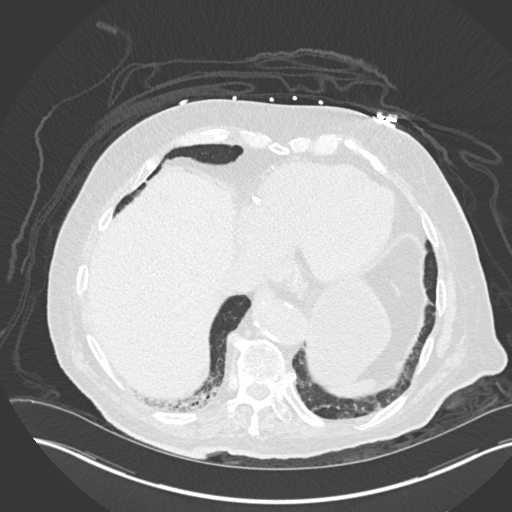
[im 42/142  lung]
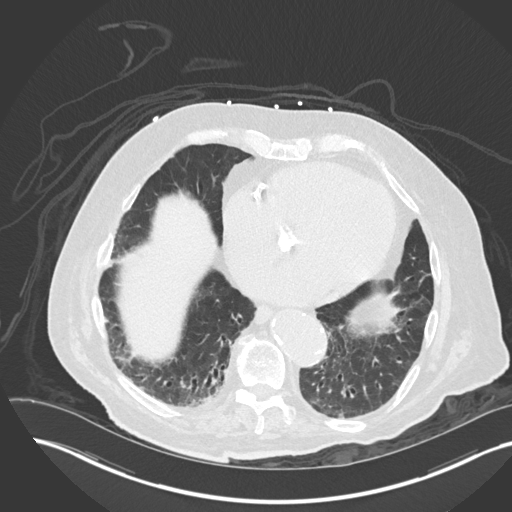
[im 53/142  mediastinal]
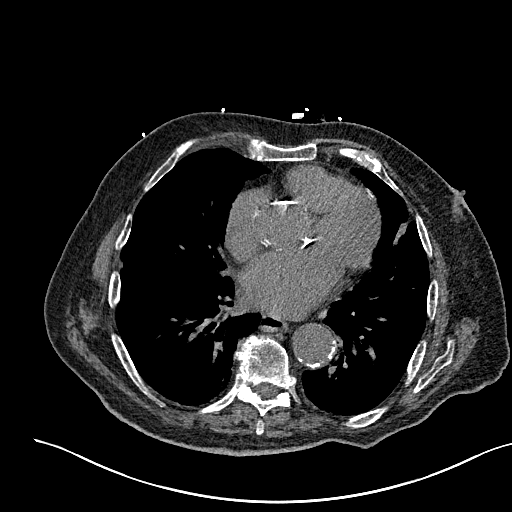
[im 53/142  lung]
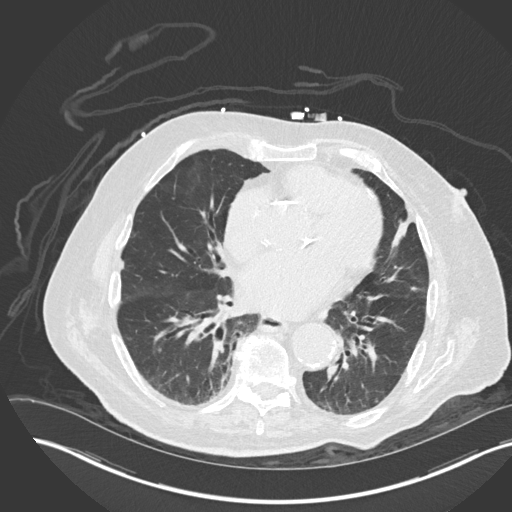
[im 63/142  lung]
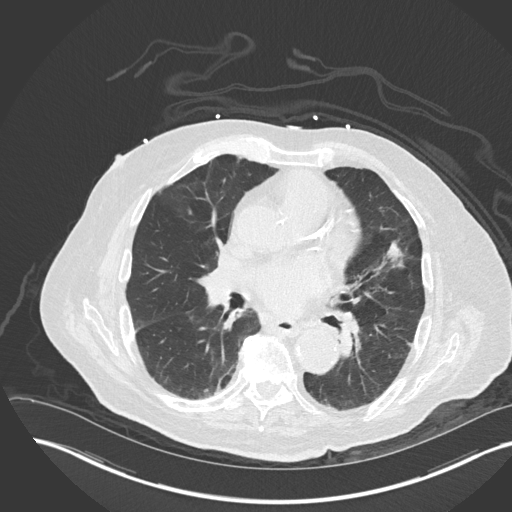
[im 79/142  lung]
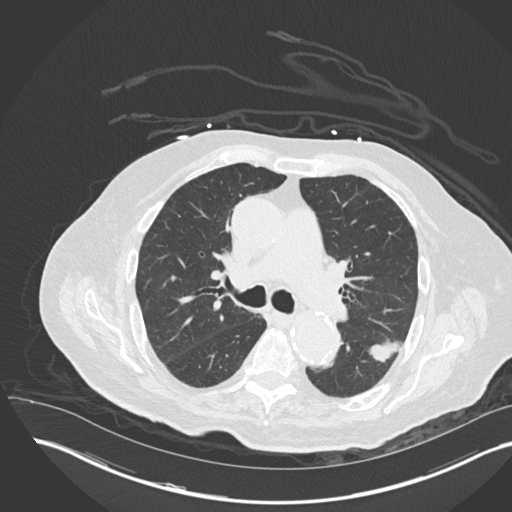
[im 89/142  lung]
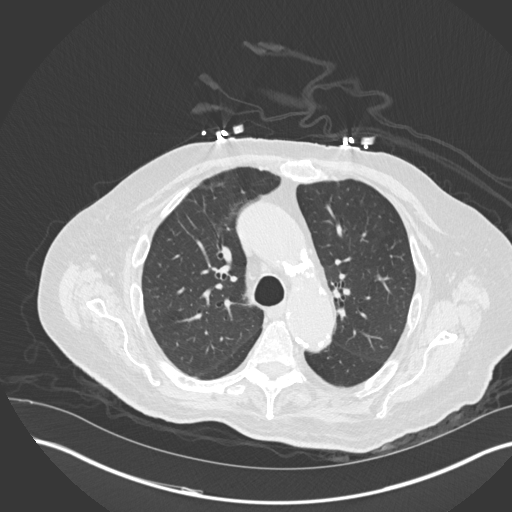
[im 100/142  mediastinal]
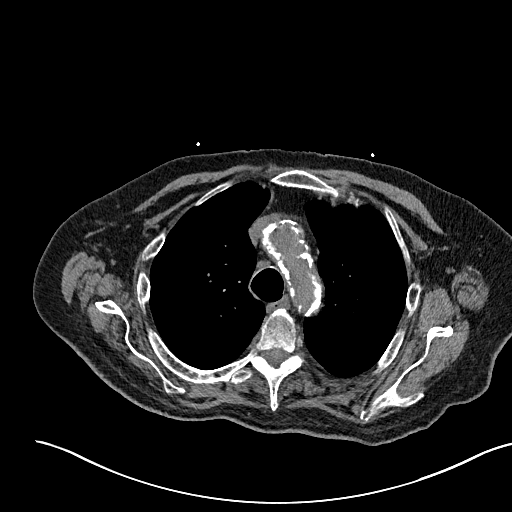
[im 100/142  lung]
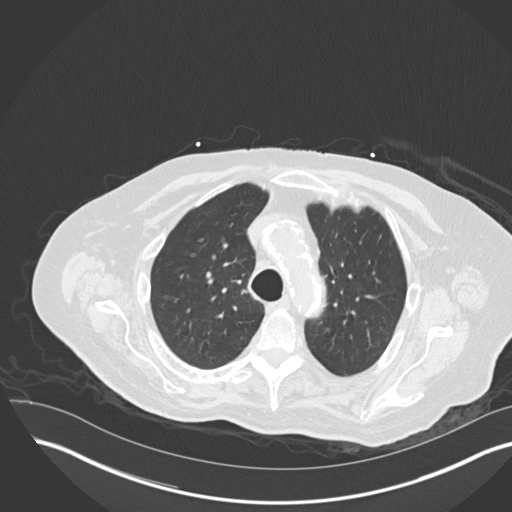
[im 110/142  lung]
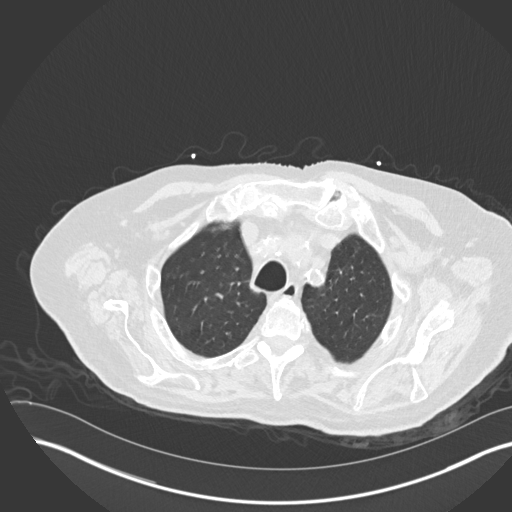
[im 121/142  lung]
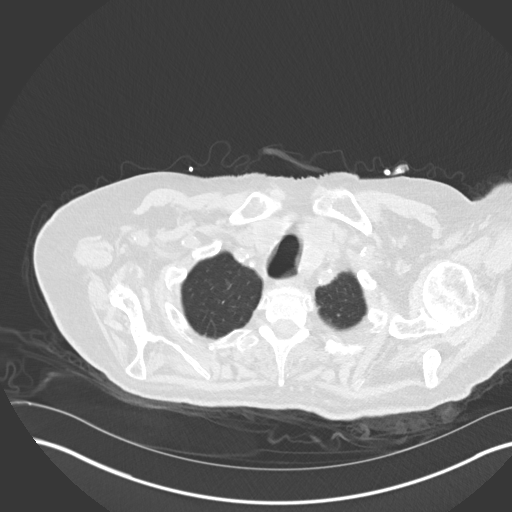
[im 131/142  lung]
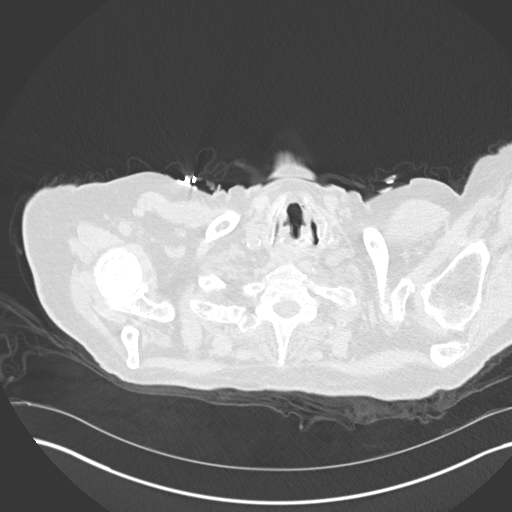

[Series 5: coronal · coronal · 0.60mm/px · 3 of 128 slices shown]
[im 26/128  lung]
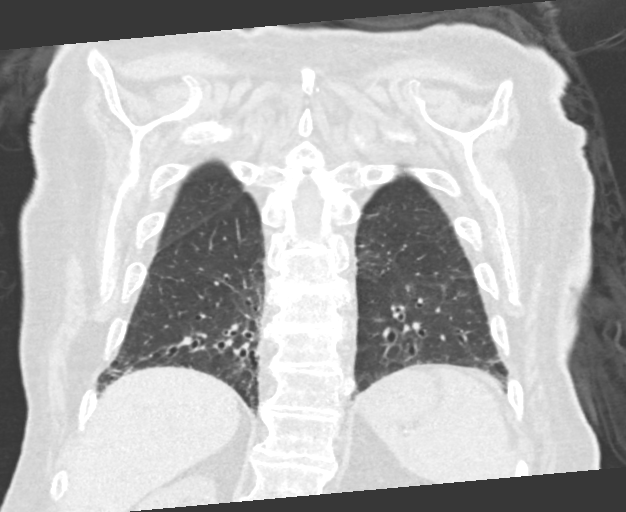
[im 51/128  lung]
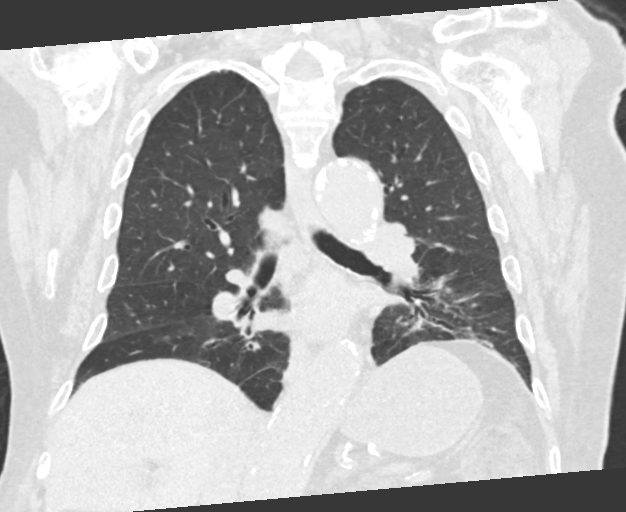
[im 77/128  lung]
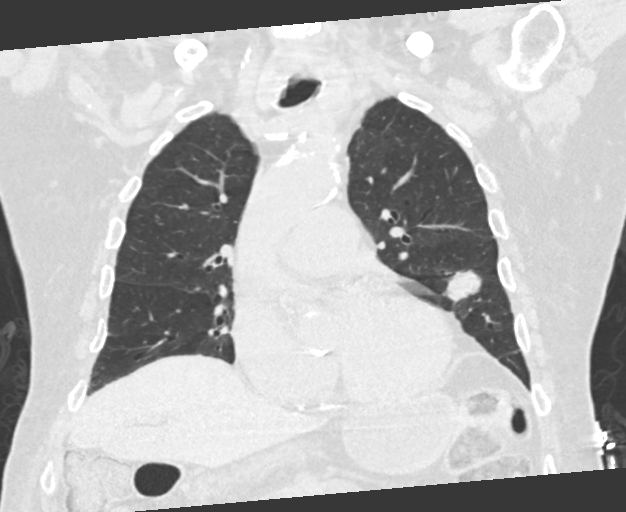

[15 of 36 positions shown; findings below may reference images not displayed]

FINDINGS: Cardiovascular: Extensive atherosclerotic calcifications are noted
of the thoracic aorta. The main pulmonary artery is dilated
measuring approximately 3.6 cm in diameter. The heart size is
normal. Aortic calcifications are noted.

Mediastinum/Nodes: There are prominent but subcentimeter mediastinal
and hilar lymph nodes bilaterally. There are no pathologically
enlarged axillary lymph nodes. Extensive thyroid nodules are again
noted but these are essentially stable from prior study.

Lungs/Pleura: There is an essentially stable opacity in the lingula
measuring approximately 3.1 cm. There is a new opacity in the
superior left lower lobe measuring approximately 3.5 cm. The trachea
is unremarkable. There is no pneumothorax. No large pleural
effusion. There are few additional scattered smaller pulmonary
opacities bilaterally including a 1.3 cm opacity in the left upper
lobe (axial series 3, image 57).

Upper Abdomen: There is a new 2.4 cm hypoattenuating mass that
appears to be located within hepatic segment [DATE] a. There is likely
cholelithiasis without CT evidence of acute cholecystitis. The
common bile duct appears dilated but this is similar to prior CT.

Musculoskeletal: There is no definite acute osseous abnormality.
There is an irregularity of the posterior third rib on the left
which has progressed slightly from prior study. This appears chronic
but is of unknown clinical significance.
IMPRESSION: 1. New pulmonary nodules as detailed above measuring up to
approximately 3.5 cm. In combination with a new 2.4 cm
hypoattenuating liver masses concerning for metastatic disease.
2. Dilated main pulmonary artery which can be seen in patients with
elevated pulmonary artery pressures.
3. Additional chronic findings as above.

Aortic Atherosclerosis ([id number]).
